# Patient Record
Sex: Male | Born: 1955
Health system: Southern US, Community
[De-identification: ages and names within clinical notes are randomized; demographics above are authoritative.]

## PROBLEM LIST (undated history)

## (undated) DIAGNOSIS — K56609 Unspecified intestinal obstruction, unspecified as to partial versus complete obstruction: Secondary | ICD-10-CM

## (undated) DIAGNOSIS — K5090 Crohn's disease, unspecified, without complications: Secondary | ICD-10-CM

## (undated) DIAGNOSIS — K5790 Diverticulosis of intestine, part unspecified, without perforation or abscess without bleeding: Secondary | ICD-10-CM

## (undated) DIAGNOSIS — K645 Perianal venous thrombosis: Secondary | ICD-10-CM

## (undated) DIAGNOSIS — T7840XA Allergy, unspecified, initial encounter: Secondary | ICD-10-CM

## (undated) DIAGNOSIS — E785 Hyperlipidemia, unspecified: Secondary | ICD-10-CM

## (undated) DIAGNOSIS — E669 Obesity, unspecified: Secondary | ICD-10-CM

## (undated) HISTORY — DX: Perianal venous thrombosis: K64.5

## (undated) HISTORY — PX: TONSILLECTOMY: SUR1361

## (undated) HISTORY — DX: Diverticulosis of intestine, part unspecified, without perforation or abscess without bleeding: K57.90

## (undated) HISTORY — PX: APPENDECTOMY: SHX54

## (undated) HISTORY — DX: Crohn's disease, unspecified, without complications: K50.90

## (undated) HISTORY — DX: Hyperlipidemia, unspecified: E78.5

## (undated) HISTORY — DX: Unspecified intestinal obstruction, unspecified as to partial versus complete obstruction: K56.609

## (undated) HISTORY — PX: HEMORROIDECTOMY: SUR656

## (undated) HISTORY — DX: Allergy, unspecified, initial encounter: T78.40XA

## (undated) HISTORY — DX: Obesity, unspecified: E66.9

## (undated) HISTORY — PX: LAMINECTOMY: SHX219

## (undated) HISTORY — PX: COLONOSCOPY: SHX174

---

## 1984-01-12 ENCOUNTER — Encounter: Payer: Self-pay | Admitting: Family Medicine

## 1984-01-12 LAB — CONVERTED CEMR LAB
RBC count: 4.57 10*6/uL
WBC, blood: 5.8 10*3/uL

## 1994-12-23 ENCOUNTER — Encounter: Payer: Self-pay | Admitting: Family Medicine

## 1994-12-23 LAB — CONVERTED CEMR LAB: Blood Glucose, Fasting: 88 mg/dL

## 2000-03-26 ENCOUNTER — Encounter: Payer: Self-pay | Admitting: Family Medicine

## 2000-03-26 LAB — CONVERTED CEMR LAB
Blood Glucose, Fasting: 97 mg/dL
TSH: 1.97 microintl units/mL

## 2002-03-07 ENCOUNTER — Encounter: Payer: Self-pay | Admitting: Family Medicine

## 2002-03-07 LAB — CONVERTED CEMR LAB: Blood Glucose, Fasting: 95 mg/dL

## 2003-01-23 ENCOUNTER — Encounter: Payer: Self-pay | Admitting: Family Medicine

## 2003-01-23 LAB — CONVERTED CEMR LAB
Blood Glucose, Fasting: 98 mg/dL
TSH: 1.34 microintl units/mL

## 2004-06-24 ENCOUNTER — Ambulatory Visit: Payer: Self-pay | Admitting: Gastroenterology

## 2004-08-18 ENCOUNTER — Ambulatory Visit: Payer: Self-pay | Admitting: Gastroenterology

## 2004-08-25 ENCOUNTER — Ambulatory Visit: Payer: Self-pay | Admitting: Gastroenterology

## 2004-09-05 ENCOUNTER — Ambulatory Visit: Payer: Self-pay | Admitting: Gastroenterology

## 2004-09-06 ENCOUNTER — Ambulatory Visit: Payer: Self-pay | Admitting: Gastroenterology

## 2004-09-23 ENCOUNTER — Ambulatory Visit: Payer: Self-pay | Admitting: Gastroenterology

## 2004-10-04 ENCOUNTER — Ambulatory Visit: Payer: Self-pay | Admitting: Gastroenterology

## 2004-10-07 ENCOUNTER — Ambulatory Visit (HOSPITAL_COMMUNITY): Admission: RE | Admit: 2004-10-07 | Discharge: 2004-10-07 | Payer: Self-pay | Admitting: Gastroenterology

## 2004-12-27 ENCOUNTER — Ambulatory Visit: Payer: Self-pay | Admitting: Gastroenterology

## 2005-03-23 ENCOUNTER — Ambulatory Visit: Payer: Self-pay | Admitting: Gastroenterology

## 2005-07-05 ENCOUNTER — Ambulatory Visit: Payer: Self-pay | Admitting: Family Medicine

## 2005-07-05 LAB — CONVERTED CEMR LAB
PSA: 0.14 ng/mL
RBC count: 4.95 10*6/uL
WBC, blood: 5 10*3/uL

## 2005-07-07 ENCOUNTER — Ambulatory Visit: Payer: Self-pay | Admitting: Family Medicine

## 2005-08-18 ENCOUNTER — Ambulatory Visit: Payer: Self-pay | Admitting: Family Medicine

## 2005-11-13 ENCOUNTER — Ambulatory Visit: Payer: Self-pay | Admitting: Family Medicine

## 2005-11-14 ENCOUNTER — Ambulatory Visit: Payer: Self-pay | Admitting: Family Medicine

## 2006-07-10 ENCOUNTER — Ambulatory Visit: Payer: Self-pay | Admitting: Family Medicine

## 2006-07-10 LAB — CONVERTED CEMR LAB
ALT: 19 units/L (ref 0–40)
Albumin: 3.6 g/dL (ref 3.5–5.2)
BUN: 13 mg/dL (ref 6–23)
Basophils Absolute: 0 10*3/uL (ref 0.0–0.1)
Basophils Relative: 0.2 % (ref 0.0–1.0)
CO2: 30 meq/L (ref 19–32)
Cholesterol: 172 mg/dL (ref 0–200)
Creatinine, Ser: 0.9 mg/dL (ref 0.4–1.5)
Eosinophils Absolute: 0.1 10*3/uL (ref 0.0–0.6)
Eosinophils Relative: 1.6 % (ref 0.0–5.0)
HCT: 43.9 % (ref 39.0–52.0)
HDL: 39.5 mg/dL (ref >39.0)
LDL Cholesterol: 110 mg/dL — ABNORMAL HIGH (ref 0–99)
MCHC: 35.1 g/dL (ref 30.0–36.0)
Neutro Abs: 4.1 10*3/uL (ref 1.4–7.7)
PSA: 0.13 ng/mL (ref 0.10–4.00)
RBC count: 4.86 10*6/uL
TSH: 1.73 microintl units/mL (ref 0.35–5.50)
Total CHOL/HDL Ratio: 4.4
Total Protein: 6.5 g/dL (ref 6.0–8.3)
WBC, blood: 5.7 10*3/uL

## 2006-07-12 ENCOUNTER — Ambulatory Visit: Payer: Self-pay | Admitting: Family Medicine

## 2007-08-01 ENCOUNTER — Encounter: Payer: Self-pay | Admitting: Family Medicine

## 2007-08-01 DIAGNOSIS — K5090 Crohn's disease, unspecified, without complications: Secondary | ICD-10-CM

## 2007-08-01 DIAGNOSIS — B009 Herpesviral infection, unspecified: Secondary | ICD-10-CM | POA: Insufficient documentation

## 2007-08-01 DIAGNOSIS — E7800 Pure hypercholesterolemia, unspecified: Secondary | ICD-10-CM | POA: Insufficient documentation

## 2007-08-01 HISTORY — DX: Crohn's disease, unspecified, without complications: K50.90

## 2007-08-22 ENCOUNTER — Ambulatory Visit: Payer: Self-pay | Admitting: Gastroenterology

## 2007-08-22 LAB — CONVERTED CEMR LAB
ALT: 23 units/L (ref 0–53)
AST: 21 units/L (ref 0–37)
Bilirubin, Direct: 0.1 mg/dL (ref 0.0–0.3)
Creatinine, Ser: 1 mg/dL (ref 0.4–1.5)
Glucose, Bld: 99 mg/dL (ref 70–99)
Sed Rate: 9 mm/hr (ref 0–20)
TSH: 2.09 microintl units/mL (ref 0.35–5.50)
Total Bilirubin: 1 mg/dL (ref 0.3–1.2)
Total Protein: 7 g/dL (ref 6.0–8.3)

## 2007-09-18 ENCOUNTER — Telehealth (INDEPENDENT_AMBULATORY_CARE_PROVIDER_SITE_OTHER): Payer: Self-pay | Admitting: *Deleted

## 2007-09-18 ENCOUNTER — Ambulatory Visit: Payer: Self-pay | Admitting: Family Medicine

## 2007-09-19 LAB — CONVERTED CEMR LAB
ALT: 23 units/L (ref 0–53)
Albumin: 3.6 g/dL (ref 3.5–5.2)
BUN: 15 mg/dL (ref 6–23)
Basophils Relative: 0.1 % (ref 0.0–1.0)
Chloride: 104 meq/L (ref 96–112)
Creatinine, Ser: 0.9 mg/dL (ref 0.4–1.5)
Direct LDL: 153.1 mg/dL
Glucose, Bld: 81 mg/dL (ref 70–99)
HCT: 44 % (ref 39.0–52.0)
Hemoglobin: 15.1 g/dL (ref 13.0–17.0)
Lymphocytes Relative: 21.7 % (ref 12.0–46.0)
MCHC: 34.2 g/dL (ref 30.0–36.0)
MCV: 91.1 fL (ref 78.0–100.0)
Neutrophils Relative %: 66.6 % (ref 43.0–77.0)
PSA: 0.16 ng/mL (ref 0.10–4.00)
Platelets: 203 10*3/uL (ref 150–400)
Potassium: 4.5 meq/L (ref 3.5–5.1)
Sodium: 142 meq/L (ref 135–145)
TSH: 1.78 microintl units/mL (ref 0.35–5.50)
Total CHOL/HDL Ratio: 5.3
Total Protein: 6.4 g/dL (ref 6.0–8.3)
VLDL: 39 mg/dL (ref 0–40)

## 2007-09-20 ENCOUNTER — Ambulatory Visit: Payer: Self-pay | Admitting: Family Medicine

## 2007-12-19 ENCOUNTER — Ambulatory Visit: Payer: Self-pay | Admitting: Family Medicine

## 2007-12-19 LAB — CONVERTED CEMR LAB
Cholesterol: 169 mg/dL (ref 0–200)
LDL Cholesterol: 103 mg/dL — ABNORMAL HIGH (ref 0–99)
Total CHOL/HDL Ratio: 4.8
Triglycerides: 156 mg/dL — ABNORMAL HIGH (ref 0–149)

## 2007-12-23 ENCOUNTER — Ambulatory Visit: Payer: Self-pay | Admitting: Family Medicine

## 2008-12-04 ENCOUNTER — Ambulatory Visit: Payer: Self-pay | Admitting: Family Medicine

## 2008-12-05 LAB — CONVERTED CEMR LAB
ALT: 28 units/L (ref 0–53)
AST: 22 units/L (ref 0–37)
Alkaline Phosphatase: 53 units/L (ref 39–117)
Basophils Absolute: 0 10*3/uL (ref 0.0–0.1)
Basophils Relative: 0 % (ref 0.0–3.0)
CO2: 30 meq/L (ref 19–32)
Calcium: 9 mg/dL (ref 8.4–10.5)
Eosinophils Absolute: 0.1 10*3/uL (ref 0.0–0.7)
Eosinophils Relative: 1.4 % (ref 0.0–5.0)
Hemoglobin: 15.3 g/dL (ref 13.0–17.0)
LDL Cholesterol: 110 mg/dL — ABNORMAL HIGH (ref 0–99)
MCV: 90.8 fL (ref 78.0–100.0)
Neutro Abs: 3.3 10*3/uL (ref 1.4–7.7)
PSA: 0.26 ng/mL (ref 0.10–4.00)
Platelets: 220 10*3/uL (ref 150.0–400.0)
Potassium: 4.3 meq/L (ref 3.5–5.1)
RDW: 12.1 % (ref 11.5–14.6)
Sodium: 143 meq/L (ref 135–145)
TSH: 2.04 microintl units/mL (ref 0.35–5.50)
Total Protein: 6.5 g/dL (ref 6.0–8.3)
Triglycerides: 185 mg/dL — ABNORMAL HIGH (ref 0.0–149.0)
VLDL: 37 mg/dL (ref 0.0–40.0)

## 2008-12-16 ENCOUNTER — Ambulatory Visit: Payer: Self-pay | Admitting: Family Medicine

## 2009-01-04 ENCOUNTER — Ambulatory Visit: Payer: Self-pay | Admitting: Gastroenterology

## 2009-02-15 ENCOUNTER — Ambulatory Visit: Payer: Self-pay | Admitting: Gastroenterology

## 2009-02-15 ENCOUNTER — Encounter: Payer: Self-pay | Admitting: Gastroenterology

## 2009-02-16 ENCOUNTER — Encounter: Payer: Self-pay | Admitting: Gastroenterology

## 2010-01-04 ENCOUNTER — Encounter (INDEPENDENT_AMBULATORY_CARE_PROVIDER_SITE_OTHER): Payer: Self-pay | Admitting: *Deleted

## 2010-02-01 ENCOUNTER — Telehealth: Payer: Self-pay | Admitting: Family Medicine

## 2010-02-23 ENCOUNTER — Encounter: Payer: Self-pay | Admitting: Gastroenterology

## 2010-02-23 ENCOUNTER — Ambulatory Visit: Payer: Self-pay | Admitting: Gastroenterology

## 2010-02-23 ENCOUNTER — Emergency Department (HOSPITAL_COMMUNITY): Admission: EM | Admit: 2010-02-23 | Discharge: 2010-02-23 | Payer: Self-pay | Admitting: Emergency Medicine

## 2010-02-23 ENCOUNTER — Telehealth: Payer: Self-pay | Admitting: Gastroenterology

## 2010-04-26 ENCOUNTER — Ambulatory Visit: Payer: Self-pay | Admitting: Family Medicine

## 2010-04-27 LAB — CONVERTED CEMR LAB
ALT: 24 units/L (ref 0–53)
AST: 18 units/L (ref 0–37)
Albumin: 3.7 g/dL (ref 3.5–5.2)
Alkaline Phosphatase: 58 units/L (ref 39–117)
BUN: 15 mg/dL (ref 6–23)
Basophils Absolute: 0 10*3/uL (ref 0.0–0.1)
Basophils Relative: 0.2 % (ref 0.0–3.0)
Bilirubin, Direct: 0.1 mg/dL (ref 0.0–0.3)
CO2: 28 meq/L (ref 19–32)
Calcium: 9.1 mg/dL (ref 8.4–10.5)
Chloride: 106 meq/L (ref 96–112)
Cholesterol: 191 mg/dL (ref 0–200)
Creatinine, Ser: 1.1 mg/dL (ref 0.4–1.5)
Direct LDL: 118.8 mg/dL
Eosinophils Absolute: 0.1 10*3/uL (ref 0.0–0.7)
Eosinophils Relative: 1.6 % (ref 0.0–5.0)
GFR calc non Af Amer: 77.18 mL/min (ref >60)
Glucose, Bld: 100 mg/dL — ABNORMAL HIGH (ref 70–99)
HCT: 43.8 % (ref 39.0–52.0)
HDL: 38.8 mg/dL — ABNORMAL LOW (ref >39.00)
Hemoglobin: 15.2 g/dL (ref 13.0–17.0)
Lymphocytes Relative: 21 % (ref 12.0–46.0)
Lymphs Abs: 1.2 10*3/uL (ref 0.7–4.0)
MCHC: 34.8 g/dL (ref 30.0–36.0)
MCV: 91.1 fL (ref 78.0–100.0)
Monocytes Absolute: 0.4 10*3/uL (ref 0.1–1.0)
Monocytes Relative: 7.5 % (ref 3.0–12.0)
Neutro Abs: 3.9 10*3/uL (ref 1.4–7.7)
Neutrophils Relative %: 69.7 % (ref 43.0–77.0)
PSA: 0.16 ng/mL (ref 0.10–4.00)
Platelets: 219 10*3/uL (ref 150.0–400.0)
Potassium: 4.3 meq/L (ref 3.5–5.1)
RBC: 4.8 M/uL (ref 4.22–5.81)
RDW: 13.3 % (ref 11.5–14.6)
Sodium: 141 meq/L (ref 135–145)
TSH: 1.87 microintl units/mL (ref 0.35–5.50)
Total Bilirubin: 0.7 mg/dL (ref 0.3–1.2)
Total CHOL/HDL Ratio: 5
Total Protein: 6.2 g/dL (ref 6.0–8.3)
Triglycerides: 212 mg/dL — ABNORMAL HIGH (ref 0.0–149.0)
VLDL: 42.4 mg/dL — ABNORMAL HIGH (ref 0.0–40.0)
WBC: 5.5 10*3/uL (ref 4.5–10.5)

## 2010-04-28 ENCOUNTER — Ambulatory Visit: Payer: Self-pay | Admitting: Family Medicine

## 2010-06-30 NOTE — Progress Notes (Signed)
Summary: Triage  Phone Note Call from Patient   Caller: spouse  Jackelyn Poling  [phone] Call For: Dr. Sharlett Iles Reason for Call: Talk to Nurse Summary of Call: pt. is having sharp pain in his abd...severe flareup with his crohn's disease Initial call taken by: Webb Laws,  February 23, 2010 8:07 AM  Follow-up for Phone Call        Patient c/o severe abdominal pain that started sudenly last night.  he c/o large distended abdomen.  Denies vomiting or nausea, but states he has not tried to eat anything.  Patient  has a hx of a partial SB obstruction in the past. I have reviewed with Nicoletta Ba PA and patient is advised to go the ER for eval.  Tye Savoy RNP has been notified the patient is coming to [facility].   Follow-up by: Barb Merino RN, [place],  February 23, 2010 10:09 AM

## 2010-06-30 NOTE — Progress Notes (Signed)
Summary: Rx Simvastatin  Phone Note Call from Patient Call back at [phone]   Caller: Patient Call For: Dr. Damita Dunnings Summary of Call: Patient needs a refill on his Simvastatin. Patient has a 30 Minute checkup scheduled with Dr. Council Mechanic on 03/31/10. Patient needs enough to last him until  his upcoming appt. [facility]  Follow-up for Phone Call       Follow-up by: Elsie Stain MD,  February 01, 2010 1:39 PM    Prescriptions: ZOCOR 80 MG TABS (SIMVASTATIN) 1 TABLET BYMOUTH AT BEDTIME  #30 Tablet x 5   Entered and Authorized by:   Elsie Stain MD   Signed by:   Elsie Stain MD on 02/01/2010   Method used:   Electronically to        [facility] (retail)       [address]       [place], [place]  [address]       Ph: [phone]       Fax: [phone]   [place]:   [phone]

## 2010-06-30 NOTE — Letter (Signed)
Summary: Anabel Halon letter  [place] [facility]  [address] [place], [place] [address]   Phone: [phone]  Fax: [phone]       01/04/2010 MRN: [id number]  [place], [place]  [address]  Dear Mr. Shirlyn Goltz [place], and Lluveras announce the retirement of Modesto Charon, M.D., from full-time practice at the [facility] office effective November 25, 2009 and his plans of returning part-time.  It is important to Dr. Council Mechanic and to our practice that you understand that [facility] has seven physicians in our office for your health care needs.  We will continue to offer the same exceptional care that you have today.    Dr. Council Mechanic has spoken to many of you about his plans for retirement and returning part-time in the fall.   We will continue to work with you through the transition to schedule appointments for you in the office and meet the high standards that [facility] is committed to.   Again, it is with great pleasure that we share the news that Dr. Council Mechanic will return to [facility] in October of 2011 with a reduced schedule.    If you have any questions, or would like to request an appointment with one of our physicians, please call us at [phone] and press the option for Scheduling an appointment.  We take pleasure in providing you with excellent patient care and look forward to seeing you at your next office visit.  [facility] are:  Viviana Simpler, M.D. Teresa Pelton, M.D. Loura Pardon, M.D. Eliezer Lofts, M.D. Owens Loffler, M.D. Arnette Norris, M.D. We proudly welcomed Renford Dills, M.D. and Ria Bush, M.D. to the practice in July/August 2011.  Sincerely,  [facility]

## 2010-06-30 NOTE — Letter (Signed)
Summary: New Patient letter  [facility]  [address] [place], [place] [address]   Phone: [phone]  Fax: [phone]       02/23/2010 MRN: [id number]  [place], [place]  [address]  Dear Mr. Midgley,  Welcome to the Gastroenterology Division at [facility].    You are scheduled to see Dr.  Sharlett Iles on 03-29-10 at 11:30am on the 3rd floor at [facility], [place] [employer].  We ask that you try to arrive at our office 15 minutes prior to your appointment time to allow for check-in.  We would like you to complete the enclosed self-administered evaluation form prior to your visit and bring it with you on the day of your appointment.  We will review it with you.  Also, please bring a complete list of all your medications or, if you prefer, bring the medication bottles and we will list them.  Please bring your insurance card so that we may make a copy of it.  If your insurance requires a referral to see a specialist, please bring your referral form from your primary care physician.  Co-payments are due at the time of your visit and may be paid by cash, check or credit card.     Your office visit will consist of a consult with your physician (includes a physical exam), any laboratory testing he/she may order, scheduling of any necessary diagnostic testing (e.g. x-ray, ultrasound, CT-scan), and scheduling of a procedure (e.g. Endoscopy, Colonoscopy) if required.  Please allow enough time on your schedule to allow for any/all of these possibilities.    If you cannot keep your appointment, please call [phone] to cancel or reschedule prior to your appointment date.  This allows [place] the opportunity to schedule an appointment for another patient in need of care.  If you do not cancel or reschedule by 5 p.m. the business day prior to your appointment date, you will be charged a $50.00 late cancellation/no-show fee.    Thank you for  choosing [facility] for your medical needs.  We appreciate the opportunity to care for you.  Please visit [place] at our website  to learn more about our practice.                     Sincerely,                                                             The Gastroenterology Division

## 2010-06-30 NOTE — Assessment & Plan Note (Signed)
Summary: CPX/JRR   Vital Signs:  Patient profile:   55 year old male Weight:      185.75 pounds BMI:     28.35 Temp:     97.8 degrees F oral Pulse rate:   76 / minute Pulse rhythm:   regular BP sitting:   110 / 70  (left arm) Cuff size:   large  Vitals Entered By: Emelia Salisbury LPN (April 29, 3535 2:58 PM) CC: 30 Minute checkup, had a colonoscopy 9/10   History of Present Illness: Pt here for Comp Exam. He has a place on the lower left lateral abdomen that hurts. 4/10, just there. Eating does not bother it, BMs nml, urination nml, colonoscopy last year, no mention to him of divertics.  Preventive Screening-Counseling & Management  Alcohol-Tobacco     Alcohol drinks/day: <1     Alcohol type: beer wine     Smoking Status: never     Passive Smoke Exposure: no  Caffeine-Diet-Exercise     Caffeine use/day: 2     Does Patient Exercise: no     Type of exercise: cardio and weights     Times/week: 3  Problems Prior to Update: 1)  Health Maintenance Exam  (ICD-V70.0) 2)  Special Screening Malignant Neoplasm of Prostate  (ICD-V76.44) 3)  Crohn's Disease  (ICD-555.9) 4)  Herpes Simplex Infection  (ICD-054.9) 5)  Hypercholesterolemia  (ICD-272.0)  Medications Prior to Update: 1)  Zocor 80 Mg Tabs (Simvastatin) .Marland Kitchen.. 1 Tablet [place] At Bedtime 2)  Levbid 0.375 Mg Tb12 (Hyoscyamine Sulfate) .... Take 1 Tablet By Mouth Every Twelve Hours  Current Medications (verified): 1)  Zocor 80 Mg Tabs (Simvastatin) .Marland Kitchen.. 1 Tablet [place] At Bedtime 2)  Levbid 0.375 Mg Tb12 (Hyoscyamine Sulfate) .... Take 1 Tablet By Mouth Every Twelve Hours  Allergies: No Known Drug Allergies  Past History:  Family History: Last updated: 04/28/2010 Father:A 12  CAD S/P MI Mother: A 63  +HTN SISTER A 58  (KELLY) obese  CV: +PGF DECEASED FROM MI// +MGF DECEASED FROM MI HBP: +PGF, +MGF., + MOTHER DIABETES : POSITIVE GOUT/ARTHRITIS:  PROSTATE CANCER: BREAST/OVARIAN/UTERINE CANCER: + MOTHER UTERINE  CANCER COLON CANCER: DEPRESSION: ETOH/DRUG ABUSE: NEGATIVE OTHER :NEGATIVE STROKE  Social History: Last updated: 12/16/2008 Marital Status: Married LIVES WITH WIFE  Children: 1 CHILD Occupation: PHARMACEUTICAL  REP  Risk Factors: Alcohol Use: <1 (04/28/2010) Caffeine Use: 2 (04/28/2010) Exercise: no (04/28/2010)  Risk Factors: Smoking Status: never (04/28/2010) Passive Smoke Exposure: no (04/28/2010)  Past Surgical History: Appendectomy 1988 Laminectomy [facility] x2  Crohn's Flares Colonoscopy --normal:(Dr. Patterson)-(-10/10/1996) Colonoscopy --polyps b-9 (+CROHNS DISEASE):(12/14/2003) Vasectomy  (Dr. McDermiad) :(06/2005) Colonoscopy Divertics, Int [place] (Dr Sharlett Iles) (02/15/2009)             5 yrs  Family History: Father:A 39  CAD S/P MI Mother: A 25  +HTN SISTER A 40  (KELLY) obese  CV: +PGF DECEASED FROM MI// +MGF DECEASED FROM MI HBP: +PGF, +MGF., + MOTHER DIABETES : POSITIVE GOUT/ARTHRITIS:  PROSTATE CANCER: BREAST/OVARIAN/UTERINE CANCER: + MOTHER UTERINE CANCER COLON CANCER: DEPRESSION: ETOH/DRUG ABUSE: NEGATIVE OTHER :NEGATIVE STROKE  Social History: Caffeine use/day:  2 Does Patient Exercise:  no  Review of Systems General:  Denies chills, fatigue, fever, sweats, weakness, and weight loss. Eyes:  Denies blurring, discharge, double vision, and eye pain. ENT:  Denies decreased hearing, ear discharge, earache, and ringing in ears. CV:  Denies chest pain or discomfort, fainting, fatigue, palpitations, shortness of breath with exertion, swelling of feet, and swelling of  hands. Resp:  Denies cough, shortness of breath, and wheezing. GI:  Complains of abdominal pain; denies bloody stools, change in bowel habits, constipation, dark tarry stools, diarrhea, indigestion, loss of appetite, nausea, vomiting, vomiting blood, and yellowish skin color; see HPI. GU:  Complains of nocturia; denies discharge, dysuria, and urinary frequency; once. MS:  Denies joint  pain, low back pain, muscle aches, cramps, and stiffness. Derm:  Complains of dryness; denies itching and rash. Neuro:  Denies numbness, poor balance, tingling, and tremors.  Physical Exam  General:  Well-developed,well-nourished,in no acute distress; alert,appropriate and cooperative throughout examination Head:  Normocephalic and atraumatic without obvious abnormalities. No apparent alopecia but min male pattern  balding. Eyes:  Conjunctiva clear bilaterally.  Ears:  External ear exam shows no significant lesions or deformities.  Otoscopic examination reveals clear canals, tympanic membranes are intact bilaterally without bulging, retraction, inflammation or discharge. Hearing is grossly normal bilaterally. Nose:  External nasal examination shows no deformity or inflammation. Nasal mucosa are pink and moist without lesions or exudates. Mouth:  Oral mucosa and oropharynx without lesions or exudates.  Teeth in good repair. Neck:  No deformities, masses, or tenderness noted. Chest Wall:  No deformities, masses, tenderness or gynecomastia noted. Breasts:  No masses or gynecomastia noted Lungs:  Normal respiratory effort, chest expands symmetrically. Lungs are clear to auscultation, no crackles or wheezes. Heart:  Normal rate and regular rhythm. S1 and S2 normal without gallop, murmur, click, rub or other extra sounds. Abdomen:  Bowel sounds positive,abdomen soft  without masses, organomegaly or hernias noted. Focally tender in the lateral left lower quadrant in a spot approx 1cm diam, musc tighten when pt found, BS active and nml sounding over the point. Rectal:  No external abnormalities noted. Normal sphincter tone. No rectal masses or tenderness. Gneg. Genitalia:  Testes bilaterally descended without nodularity, tenderness or masses. No scrotal masses or lesions. No penis lesions or urethral discharge. Prostate:  Prostate gland firm and smooth, no enlargement, nodularity, tenderness, mass,  asymmetry or induration. 20gms. Msk:  No deformity or scoliosis noted of thoracic or lumbar spine.   Pulses:  R and L carotid,radial,femoral,dorsalis pedis and posterior tibial pulses are full and equal bilaterally Extremities:  No clubbing, cyanosis, edema, or deformity noted with normal full range of motion of all joints.   Neurologic:  No cranial nerve deficits noted. Station and gait are normal. Sensory, motor and coordinative functions appear intact. Skin:  Intact without suspicious lesions or rashes Cervical Nodes:  No lymphadenopathy noted Inguinal Nodes:  No significant adenopathy Psych:  Cognition and judgment appear intact. Alert and cooperative with normal attention span and concentration. No apparent delusions, illusions, hallucinations   Impression & Recommendations:  Problem # 1:  [place] (ICD-V70.0) Assessment Comment Only  Reviewed preventive care protocols, scheduled due services, and updated immunizations.  Problem # 2:  ABDOMINAL PAIN, LLQ, FOCAL (ICD-789.04) Assessment: New  Presumed acute diverticulitis. will prophylax with Cipro 500 two times a day for one week. RTC if sxs don't totally resolve.  Discussed symptom control with the patient.   Problem # 3:  SPECIAL SCREENING MALIGNANT NEOPLASM OF PROSTATE (ICD-V76.44) Assessment: Unchanged Stable exam and PSA.  Problem # 4:  HYPERCHOLESTEROLEMIA (ICD-272.0) Assessment: Unchanged Adequate but changing from Zocor 80 (high dose but has been on it many years so risk of rabdo minimal) to Crestor 20 would be reasonable to improve Trigs and HDL. He will check his insurance company. His updated medication list for this problem includes:  Zocor 80 Mg Tabs (Simvastatin) .Marland Kitchen... 1 tablet [place] at bedtime  Labs Reviewed: SGOT: 18 (04/26/2010)   SGPT: 24 (04/26/2010)   HDL:38.80 (04/26/2010), 39.50 (12/04/2008)  LDL:110 (12/04/2008), 103 (12/19/2007)  Chol:191 (04/26/2010), 186 (12/04/2008)  Trig:212.0  (04/26/2010), 185.0 (12/04/2008)  Problem # 5:  CROHN'S DISEASE (ICD-555.9) Assessment: Unchanged Seems stable, last colonoscopy last year.  Complete Medication List: 1)  Zocor 80 Mg Tabs (Simvastatin) .Marland Kitchen.. 1 tablet [place] at bedtime 2)  Levbid 0.375 Mg Tb12 (Hyoscyamine sulfate) .... Take 1 tablet by mouth every twelve hours 3)  Cipro 500 Mg Tabs (Ciprofloxacin hcl) .... One tab by mouth two times a day  Patient Instructions: 1)  RTC if abd pain doesn't resolve in one week. 2)  Call if insur will cover Crestor to switch. Prescriptions: CIPRO 500 MG TABS (CIPROFLOXACIN HCL) one tab by mouth two times a day  #14 x 0   Entered and Authorized by:   Raenette Rover MD   Signed by:   Raenette Rover MD on 04/28/2010   Method used:   Electronically to        [facility]       [address]       [place], [place]  [address]       Ph: [phone]       Fax: [phone]   RxID:   [id number]    Orders Added: 1)  Est. Patient 40-64 years [[id number]]    Current Allergies (reviewed today): No known allergies

## 2010-08-11 LAB — CBC
MCH: 31.2 pg (ref 26.0–34.0)
MCV: 90.2 fL (ref 78.0–100.0)
Platelets: 221 10*3/uL (ref 150–400)
RBC: 5.09 MIL/uL (ref 4.22–5.81)

## 2010-08-11 LAB — DIFFERENTIAL
Basophils Absolute: 0 10*3/uL (ref 0.0–0.1)
Eosinophils Relative: 0 % (ref 0–5)
Lymphocytes Relative: 4 % — ABNORMAL LOW (ref 12–46)
Lymphs Abs: 0.3 10*3/uL — ABNORMAL LOW (ref 0.7–4.0)
Monocytes Absolute: 0.3 10*3/uL (ref 0.1–1.0)
Monocytes Relative: 4 % (ref 3–12)
Neutrophils Relative %: 93 % — ABNORMAL HIGH (ref 43–77)

## 2010-08-11 LAB — COMPREHENSIVE METABOLIC PANEL
Alkaline Phosphatase: 57 U/L (ref 39–117)
Chloride: 107 mEq/L (ref 96–112)
GFR calc non Af Amer: >60 mL/min (ref >60)
Potassium: 3.9 mEq/L (ref 3.5–5.1)
Total Bilirubin: 0.9 mg/dL (ref 0.3–1.2)

## 2010-08-11 LAB — POCT I-STAT, CHEM 8
Calcium, Ion: 1.14 mmol/L (ref 1.12–1.32)
Potassium: 3.9 mEq/L (ref 3.5–5.1)

## 2010-08-11 LAB — URINALYSIS, ROUTINE W REFLEX MICROSCOPIC
Glucose, UA: NEGATIVE mg/dL
Ketones, ur: 15 mg/dL — AB
Specific Gravity, Urine: 1.03 (ref 1.005–1.030)
Urobilinogen, UA: 0.2 mg/dL (ref 0.0–1.0)
pH: 5.5 (ref 5.0–8.0)

## 2010-08-11 LAB — LIPASE, BLOOD: Lipase: 20 U/L (ref 11–59)

## 2010-08-11 LAB — URINE MICROSCOPIC-ADD ON

## 2010-10-11 NOTE — Assessment & Plan Note (Signed)
[facility] OFFICE NOTE   NAME:Babula, CHASEN MENDELL                      MRN:          [id number]  DATE:08/22/2007                            DOB:          Jul 18, 1955    I have not seen Harim since October of 2006.  He has had some periodic  flares of his Crohn's ileocolitis, usually managed by a low-fiber diet  and p.r.n. anticholinergics.  The last 24 hours had rather severe crampy  abdominal pain, some soft stools, but not rectal bleeding, nausea,  vomiting, or systemic complaints.  He denies recent antibiotic use,  infectious disease exposure, or sick family members at home.   Review of his chart shows previous allergic reaction to 6MP with good  response to the past 2 brief trials of corticosteroids.  He has a known  stricture in his distal ileum per small bowel series last obtained in  May of 2006.  Last colonoscopy exam was in July of 2005 and he had  rather active ileocolitis at that time.   He appears slightly ill but is not toxic.  He weighs 181 pounds, and blood pressure 110/76, and the pulse was 68  and regular.  I could not appreciate icterus or stigmata of chronic liver disease.  His chest was entirely clear.  He was in a regular rhythm without murmurs, gallops, or rubs.  His abdomen showed no distension, organomegaly or masses, or significant  tenderness.  Bowel sounds were very active and some were high pitched.  Peripheral extremities were unremarkable.  Mental status was normal.  Inspection of the rectum was unremarkable as was rectal exam with soft  stool in the rectal vault that was guaiac negative.   ASSESSMENT:  I think Mr. Sear has a probable flare of his Crohn's  ileocolitis, and his symptoms seem most consistent with partial small  bowel obstruction.  He has had this disease, now, for 29 years and may  need surgical intervention in the near future.   RECOMMENDATIONS:  1.  Prednisone 30 mg a day for 2 weeks with low-fiber diet.  2. Check screening laboratory parameters.  3. Gastrointestinal followup in 2 weeks' time or sooner should he not      respond.  Will probably check CT scan to evaluate the extent of his      disease at that time depending on his clinical course.     Loralee Pacas. Sharlett Iles, MD, [place], [place]  Electronically Signed    DRP/MedQ  DD: 08/22/2007  DT: 08/22/2007  Job #: [id number]

## 2010-11-29 ENCOUNTER — Other Ambulatory Visit: Payer: Self-pay | Admitting: *Deleted

## 2010-11-29 MED ORDER — SIMVASTATIN 80 MG PO TABS: 80.0000 mg | ORAL_TABLET | Freq: Every day | ORAL | Status: DC

## 2011-10-09 ENCOUNTER — Encounter: Payer: Self-pay | Admitting: Family Medicine

## 2012-12-04 ENCOUNTER — Encounter (INDEPENDENT_AMBULATORY_CARE_PROVIDER_SITE_OTHER): Payer: Self-pay | Admitting: Surgery

## 2012-12-04 ENCOUNTER — Ambulatory Visit (INDEPENDENT_AMBULATORY_CARE_PROVIDER_SITE_OTHER): Payer: Federal, State, Local not specified - PPO | Admitting: Surgery

## 2012-12-04 VITALS — BP 102/62 | HR 70 | Resp 18 | Ht 68.0 in | Wt 184.0 lb

## 2012-12-04 DIAGNOSIS — K5790 Diverticulosis of intestine, part unspecified, without perforation or abscess without bleeding: Secondary | ICD-10-CM

## 2012-12-04 DIAGNOSIS — K645 Perianal venous thrombosis: Secondary | ICD-10-CM

## 2012-12-04 DIAGNOSIS — K5730 Diverticulosis of large intestine without perforation or abscess without bleeding: Secondary | ICD-10-CM

## 2012-12-04 HISTORY — DX: Perianal venous thrombosis: K64.5

## 2012-12-04 NOTE — Progress Notes (Signed)
Subjective:     Patient ID: Terry Kerr, male   DOB: December 23, 1955, 57 y.o.   MRN: [id number]  HPI  Nicolaus Andel  January 08, 1956 [id number]  Patient Care Team: Janalyn Rouse, MD as PCP - General (Internal Medicine) Sable Feil, MD as Attending Physician (Gastroenterology)  This patient is a 57 y.o.male who presents today for surgical evaluation at the request of Dr. Brigitte Pulse.   Reason for visit: Painful hemorrhoid  Pleasant male with Crohn's disease.  Mild.  Not requiring immunosuppression.  History some mild chronic hemorrhoids.  No history of other anorectal problems such as abscess or fistula.  Had severe painful hemorrhoid month ago.  Has not improved.  Has tried warm soaks.  He saw his primary care physician yesterday.  Started on Proctosol cream.  Some mild help.  Pericortical.  Surgical consultation requested.  Patient usually has a couple bowel movements in the morning.  He had a colonoscopy in 2010 that did not show any active colitis or abnormalities.  He walks 30 minutes without difficulty.  No personal nor family history of GI/colon cancer, irritable bowel syndrome, allergy such as Celiac Sprue, dietary/dairy problems, colitis, ulcers nor gastritis.  No recent sick contacts/gastroenteritis.  No travel outside the country.  No changes in diet.    Patient Active Problem List   Diagnosis Date Noted  . HERPES SIMPLEX INFECTION 08/01/2007  . HYPERCHOLESTEROLEMIA 08/01/2007  . CROHN'S DISEASE 08/01/2007    Past Medical History  Diagnosis Date  . Allergic rhinitis     pollen, animal hair, dust mites  . HLD (hyperlipidemia)   . Crohn disease     Past Surgical History  Procedure Laterality Date  . Appendectomy    . Tonsillectomy    . Laminectomy      History   Social History  . Marital Status: Married    Spouse Name: N/A    Number of Children: N/A  . Years of Education: N/A   Occupational History  . Not on file.   Social History Main Topics  . Smoking status:  Never Smoker   . Smokeless tobacco: Not on file  . Alcohol Use: Yes  . Drug Use: No  . Sexually Active: Not on file   Other Topics Concern  . Not on file   Social History Narrative  . No narrative on file    History reviewed. No pertinent family history.  Current Outpatient Prescriptions  Medication Sig Dispense Refill  . simvastatin (ZOCOR) 80 MG tablet Take 1 tablet (80 mg total) by mouth at bedtime.  30 tablet  6   No current facility-administered medications for this visit.     Not on File  BP 102/62  Pulse 70  Resp 18  Ht 5' 8"  (1.727 m)  Wt 184 lb (83.462 kg)  BMI 27.98 kg/m2  No results found.   Review of Systems  Constitutional: Negative for fever, chills and diaphoresis.  HENT: Negative for nosebleeds, sore throat, facial swelling, mouth sores, trouble swallowing and ear discharge.   Eyes: Negative for photophobia, discharge and visual disturbance.  Respiratory: Negative for choking, chest tightness, shortness of breath and stridor.   Cardiovascular: Negative for chest pain and palpitations.  Gastrointestinal: Positive for anal bleeding and rectal pain. Negative for nausea, vomiting, abdominal pain, diarrhea, constipation, blood in stool and abdominal distention.  Endocrine: Negative for cold intolerance and heat intolerance.  Genitourinary: Negative for dysuria, urgency, difficulty urinating and testicular pain.  Musculoskeletal: Negative for myalgias, back pain, arthralgias and gait  problem.  Skin: Negative for color change, pallor, rash and wound.  Allergic/Immunologic: Negative for environmental allergies and food allergies.  Neurological: Negative for dizziness, speech difficulty, weakness, numbness and headaches.  Hematological: Negative for adenopathy. Does not bruise/bleed easily.  Psychiatric/Behavioral: Negative for hallucinations, confusion and agitation.       Objective:   Physical Exam  Constitutional: He is oriented to person, place, and  time. He appears well-developed and well-nourished. No distress.  HENT:  Head: Normocephalic.  Mouth/Throat: Oropharynx is clear and moist. No oropharyngeal exudate.  Eyes: Conjunctivae and EOM are normal. Pupils are equal, round, and reactive to light. No scleral icterus.  Neck: Normal range of motion. Neck supple. No tracheal deviation present.  Cardiovascular: Normal rate, regular rhythm and intact distal pulses.   Pulmonary/Chest: Effort normal and breath sounds normal. No respiratory distress.  Abdominal: Soft. He exhibits no distension. There is no tenderness. Hernia confirmed negative in the right inguinal area and confirmed negative in the left inguinal area.  Genitourinary:     Musculoskeletal: Normal range of motion. He exhibits no tenderness.  Lymphadenopathy:    He has no cervical adenopathy.       Right: No inguinal adenopathy present.       Left: No inguinal adenopathy present.  Neurological: He is alert and oriented to person, place, and time. No cranial nerve deficit. He exhibits normal muscle tone. Coordination normal.  Skin: Skin is warm and dry. No rash noted. He is not diaphoretic. No erythema. No pallor.  Psychiatric: He has a normal mood and affect. His behavior is normal. Judgment and thought content normal.       Assessment:     Thrombosed prolapsed inflamed hemorrhoids x3 piles.  Right anterior greater than left lateral greater than right posterior     Plan:     I offered incision and drainage of the thrombosed hemorrhoids in the office.  He agreed:  The anatomy & physiology of the anorectal region was discussed.  The pathophysiology of hemorrhoids and differential diagnosis was discussed.  Natural history progression  of worsening swelling with eventual resolution over weeks to months was discussed.   I stressed the importance of a bowel regimen to have daily soft bowel movements to minimize progression of disease.     The patient's symptoms are not  adequately controlled.  Therefore, I recommended incision to drain the thrombosed hemorrhoid..  I went over the technique, risks, benefits, and alternatives.   Questions were answered.  The patient expressed understanding & wished to proceed.  The patient was positioned in the lateral decubitus position.  I placed a field block of local anaesthetic.  Perianal & rectal examination was done.  I attempted incision and drainage of the thrombosed hemorrhoid.  However he was still too sensitive.  Had hard he reached the upper limits of placing local anesthetic.  I aborted the procedure.  Unfortunately these are too sensitive to treat in the office.  I therefore recommended examination under anesthesia and decompression in the operating room.  Possible partial hemorrhoidectomies as well.  I cautioned him I cannot be too aggressive in an inflamed setting given his history of Crohn's and all piles involved.  Stricturing and wound problems is higher risk in an inflamed setting.  Opening available in the next two days.  He felt comfortable waiting:  The anatomy & physiology of the anorectal region was discussed.  The pathophysiology of hemorrhoids and differential diagnosis was discussed.  Natural history risks without surgery was discussed.  I stressed the importance of a bowel regimen to have daily soft bowel movements to minimize progression of disease.  Interventions such as sclerotherapy & banding were discussed.  The patient's symptoms are not adequately controlled by medicines and other non-operative treatments.  I feel the risks & problems of no surgery outweigh the operative risks; therefore, I recommended surgery to treat the hemorrhoids by ligation, pexy, and possible resection.  Given the inflamed nature, I cautioned him against being overly aggressive in plan mainly decompression and ligation.  Risks such as bleeding, infection, need for further treatment, heart attack, death, and other risks were  discussed.   I noted a good likelihood this will help address the problem.  Goals of post-operative recovery were discussed as well.  Possibility that this will not correct all symptoms was explained.  Post-operative pain, bleeding, constipation, and other problems after surgery were discussed.  We will work to minimize complications.   Educational handouts further explaining the pathology, treatment options, and bowel regimen were given as well.  Questions were answered.  The patient expresses understanding & wishes to proceed with surgery.  Educational handouts further explaining the pathology, treatment options, and bowel regimen were given as well.

## 2012-12-04 NOTE — Patient Instructions (Addendum)
See the Handout(s) we gave you.  Consider surgery.  Please call our office at [phone] if you wish to schedule surgery or if you have further questions / concerns.    ANORECTAL SURGERY: POST OP INSTRUCTIONS  1. Take your usually prescribed home medications unless otherwise directed. 2. DIET: Follow a light bland diet the first 24 hours after arrival home, such as soup, liquids, crackers, etc.  Be sure to include lots of fluids daily.  Avoid fast food or heavy meals as your are more likely to get nauseated.  Eat a low fat the next few days after surgery.   3. PAIN CONTROL: a. Pain is best controlled by a usual combination of three different methods TOGETHER: i. Ice/Heat ii. Over the counter pain medication iii. Prescription pain medication b. Most patients will experience some swelling and discomfort in the anus/rectal area. and incisions.  Ice packs or heat (30-60 minutes up to 6 times a day) will help. Use ice for the first few days to help decrease swelling and bruising, then switch to heat such as warm towels, sitz baths, warm baths, etc to help relax tight/sore spots and speed recovery.  Some people prefer to use ice alone, heat alone, alternating between ice & heat.  Experiment to what works for you.  Swelling and bruising can take several weeks to resolve.   c. It is helpful to take an over-the-counter pain medication regularly for the first few weeks.  Choose one of the following that works best for you: i. Naproxen (Aleve, etc)  Two 296m tabs twice a day ii. Ibuprofen (Advil, etc) Three 2041mtabs four times a day (every meal & bedtime) iii. Acetaminophen (Tylenol, etc) 500-65066mour times a day (every meal & bedtime) d. A  prescription for pain medication (such as oxycodone, hydrocodone, etc) should be given to you upon discharge.  Take your pain medication as prescribed.  i. If you are having problems/concerns with the prescription medicine (does not control pain, nausea,  vomiting, rash, itching, etc), please call us [phone] see if we need to switch you to a different pain medicine that will work better for you and/or control your side effect better. ii. If you need a refill on your pain medication, please contact your pharmacy.  They will contact our office to request authorization. Prescriptions will not be filled after 5 pm or on week-ends. 4. KEEP YOUR BOWELS REGULAR a. The goal is one bowel movement a day b. Avoid getting constipated.  Between the surgery and the pain medications, it is common to experience some constipation.  Increasing fluid intake and taking a fiber supplement (such as Metamucil, Citrucel, FiberCon, MiraLax, etc) 1-2 times a day regularly will usually help prevent this problem from occurring.  A mild laxative (prune juice, Milk of Magnesia, MiraLax, etc) should be taken according to package directions if there are no bowel movements after 48 hours. c. Watch out for diarrhea.  If you have many loose bowel movements, simplify your diet to bland foods & liquids for a few days.  Stop any stool softeners and decrease your fiber supplement.  Switching to mild anti-diarrheal medications (Kayopectate, Pepto Bismol) can help.  If this worsens or does not improve, please call us.[email or web address]. Wound Care a. Remove your bandages the day after surgery.  Unless discharge instructions indicate otherwise, leave your bandage dry and in place overnight.  Remove the bandage during your first bowel movement.   b. Allow the wound packing to  fall out over the next few days.  You can trim exposed gauze / ribbon as it falls out.  You do not need to repack the wound unless instructed otherwise.  Wear an absorbent pad or soft cotton gauze in your underwear as needed to catch any drainage and help keep the area  c. Keep the area clean and dry.  Bathe / shower every day.  Keep the area clean by showering / bathing over the incision / wound.   It is okay to soak an open wound  to help wash it.  Wet wipes or showers / gentle washing after bowel movements is often less traumatic than regular toilet paper. d. Dennis Bast may have some styrofoam-like soft packing in the rectum which will come out with the first bowel movement.  e. You will often notice bleeding with bowel movements.  This should slow down by the end of the first week of surgery f. Expect some drainage.  This should slow down, too, by the end of the first week of surgery.  Wear an absorbent pad or soft cotton gauze in your underwear until the drainage stops. 6. ACTIVITIES as tolerated:   a. You may resume regular (light) daily activities beginning the next day-such as daily self-care, walking, climbing stairs-gradually increasing activities as tolerated.  If you can walk 30 minutes without difficulty, it is safe to try more intense activity such as jogging, treadmill, bicycling, low-impact aerobics, swimming, etc. b. Save the most intensive and strenuous activity for last such as sit-ups, heavy lifting, contact sports, etc  Refrain from any heavy lifting or straining until you are off narcotics for pain control.   c. DO NOT PUSH THROUGH PAIN.  Let pain be your guide: If it hurts to do something, don't do it.  Pain is your body warning you to avoid that activity for another week until the pain goes down. d. You may drive when you are no longer taking prescription pain medication, you can comfortably sit for long periods of time, and you can safely maneuver your car and apply brakes. e. Dennis Bast may have sexual intercourse when it is comfortable.  7. FOLLOW UP in our office a. Please call [facility] at [phone] to set up an appointment to see your surgeon in the office for a follow-up appointment approximately 2 weeks after your surgery. b. Make sure that you call for this appointment the day you arrive home to insure a convenient appointment time. 10. IF YOU HAVE DISABILITY OR FAMILY LEAVE FORMS, BRING THEM TO THE OFFICE FOR  PROCESSING.  DO NOT GIVE THEM TO YOUR DOCTOR.        WHEN TO CALL us [phone]: 1. Poor pain control 2. Reactions / problems with new medications (rash/itching, nausea, etc)  3. Fever over 101.5 F (38.5 C) 4. Inability to urinate 5. Nausea and/or vomiting 6. Worsening swelling or bruising 7. Continued bleeding from incision. 8. Increased pain, redness, or drainage from the incision  The clinic staff is available to answer your questions during regular business hours (8:30am-5pm).  Please don't hesitate to call and ask to speak to one of our nurses for clinical concerns.   A surgeon from [facility] is always on call at the hospitals   If you have a medical emergency, go to the nearest emergency room or call 911.    [facility], [place], [place], [place], [place]  [address] ? MAIN: [phone] ? TOLL FREE: [phone] ?  FAX [phone] [email or web address]   HEMORRHOIDS  The rectum is the last foot of your colon, and it naturally stretches to hold stool.  Hemorrhoidal piles are natural clusters of blood vessels that help the rectum and anal canal stretch to hold stool and allow bowel movements to eliminate feces.   Hemorrhoids are abnormally swollen blood vessels in the rectum.  Too much pressure in the rectum causes hemorrhoids by forcing blood to stretch and bulge the walls of the veins, sometimes even rupturing them.  Hemorrhoids can become like varicose veins you might see on a person's legs.  Most people will develop a flare of hemorrhoids in their lifetime.  When bulging hemorrhoidal veins are irritated, they can swell, burn, itch, cause pain, and bleed.  Most flares will calm down gradually own within a few weeks.  However, once hemorrhoids are created, they are difficult to get rid of completely and tend to flare more easily than the first flare.   Fortunately, good habits and simple medical treatment usually  control hemorrhoids well, and surgery is needed only in severe cases. Types of Hemorrhoids:  Internal hemorrhoids usually don't initially hurt or itch; they are deep inside the rectum and usually have no sensation. If they begin to push out (prolapse), pain and burning can occur.  However, internal hemorrhoids can bleed.  Anal bleeding should not be ignored since bleeding could come from a dangerous source like colorectal cancer, so persistent rectal bleeding should be investigated by a doctor, sometimes with a colonoscopy.  External hemorrhoids cause most of the symptoms - pain, burning, and itching. Nonirritated hemorrhoids can look like small skin tags coming out of the anus.   Thrombosed hemorrhoids can form when a hemorrhoid blood vessel bursts and causes the hemorrhoid to suddenly swell.  A purple blood clot can form in it and become an excruciatingly painful lump at the anus. Because of these unpleasant symptoms, immediate incision and drainage by a surgeon at an office visit can provide much relief of the pain.    PREVENTION Avoiding the most frequent causes listed below will prevent most cases of hemorrhoids: Constipation Hard stools Diarrhea  Constant sitting  Straining with bowel movements Sitting on the toilet for a long time  Severe coughing  episodes Pregnancy / Childbirth  Heavy Lifting  Sometimes avoiding the above triggers is difficult:  How can you avoid sitting all day if you have a seated job? Also, we try to avoid coughing and diarrhea, but sometimes it's beyond your control.  Still, there are some practical hints to help: Keep the anal and genital area clean.  Moistened tissues such as flushable wet wipes are less irritating than toilet paper.  Using irrigating showers or bottle irrigation washing gently cleans this sensitive area.   Avoid dry toilet paper when cleaning after bowel movements.  Marland Kitchen Keep the anal and genital area dry.  Lightly pat the rectal area dry.  Avoid  rubbing.  Talcum or baby powders can help GET YOUR STOOLS SOFT.   This is the most important way to prevent irritated hemorrhoids.  Hard stools are like sandpaper to the anorectal canal and will cause more problems.  The goal: ONE SOFT BOWEL MOVEMENT A DAY!  BMs from every other day to 3 times a day is a tolerable range Treat coughing, diarrhea and constipation early since irritated hemorrhoids may soon follow.  If your main job activity is seated, always stand or walk during your breaks. Make it a point to stand and  walk at least 5 minutes every hour and try to shift frequently in your chair to avoid direct rectal pressure.  Always exhale as you strain or lift. Don't hold your breath.  Do not delay or try to prevent a bowel movement when the urge is present. Exercise regularly (walking or jogging 60 minutes a day) to stimulate the bowels to move. No reading or other activity while on the toilet. If bowel movements take longer than 5 minutes, you are too constipated. AVOID CONSTIPATION Drink plenty of liquids (1 1/2 to 2 quarts of water and other fluids a day unless fluid restricted for another medical condition). Liquids that contain caffeine (coffee a, tea, soft drinks) can be dehydrating and should be avoided until constipation is controlled. Consider minimizing milk, as dairy products may be constipating. Eat plenty of fiber (30g a day ideal, more if needed).  Fiber is the undigested part of plant food that passes into the colon, acting as "natures broom" to encourage bowel motility and movement.  Fiber can absorb and hold large amounts of water. This results in a larger, bulkier stool, which is soft and easier to pass.  Eating foods high in fiber - 12 servings - such as  Vegetables: Root (potatoes, carrots, turnips), Leafy green (lettuce, salad greens, celery, spinach), High residue (cabbage, broccoli, etc.) Fruit: Fresh, Dried (prunes, apricots, cherries), Stewed (applesauce)  Whole grain breads,  pasta, whole wheat Bran cereals, muffins, etc. Consider adding supplemental bulking fiber which retains large volumes of water: Psyllium ground seeds --available as Metamucil, Konsyl, Effersyllium, Per Diem Fiber, or the less expensive generic forms.  Citrucel  (methylcellulose wood fiber) . FiberCon (Polycarbophil) Polyethylene Glycol - and "artificial" fiber commonly called Miralax or Glycolax.  It is helpful for people with gassy or bloated feelings with regular fiber Flax Seed - a less gassy natural fiber  Laxatives can be useful for a short period if constipation is severe Osmotics (Milk of Magnesia, Fleets Phospho-Soda, Magnesium Citrate)  Stimulants (Senokot,   Castor Oil,  Dulcolax, Ex-Lax)    Laxatives are not a good long-term solution as it can stress the bowels and cause too much mineral loss and dehydration.   Avoid taking laxatives for more than 7 days in a row.  AVOID DIARRHEA Switch to liquids and simpler foods for a few days to avoid stressing your intestines further. Avoid dairy products (especially milk & ice cream) for a short time.  The intestines often can lose the ability to digest lactose when stressed. Avoid foods that cause gassiness or bloating.  Typical foods include beans and other legumes, cabbage, broccoli, and dairy foods.  Every person has some sensitivity to other foods, so listen to your body and avoid those foods that trigger problems for you. Adding fiber (Citrucel, Metamucil, FiberCon, Flax seed, Miralax) gradually can help thicken stools by absorbing excess fluid and retrain the intestines to act more normally.  Slowly increase the dose over a few weeks.  Too much fiber too soon can backfire and cause cramping & bloating. Probiotics (such as active yogurt, Align, etc) may help repopulate the intestines and colon with normal bacteria and calm down a sensitive digestive tract.  Most studies show it to be of mild help, though, and such products can be  costly. Medicines: Bismuth subsalicylate (ex. Kayopectate, Pepto Bismol) every 30 minutes for up to 6 doses can help control diarrhea.  Avoid if pregnant. Loperamide (Immodium) can slow down diarrhea.  Start with two tablets (29m total) first and then  try one tablet every 6 hours.  Avoid if you are having fevers or severe pain.  If you are not better or start feeling worse, stop all medicines and call your doctor for advice Call your doctor if you are getting worse or not better.  Sometimes further testing (cultures, endoscopy, X-ray studies, bloodwork, etc) may be needed to help diagnose and treat the cause of the diarrhea. TREATMENT OF HEMORRHOID FLARE If these preventive measures fail, you must take action right away! Hemorrhoids are one condition that can be mild in the morning and become intolerable by nightfall. Most hemorrhoidal flares take several weeks to calm down.  These suggestions can help: Warm soaks.  This helps more than any topical medication.  Use up to 8 times a day.  Usually sitz baths or sitting in a warm bathtub helps.  Sitting on moist warm towels are helpful.  Switching to ice packs/cool compresses can be helpful Normalize your bowels.  Extremes of diarrhea or constipation will make hemorrhoids worse.  One soft bowel movement a day is the goal.  Fiber can help get your bowels regular Wet wipes instead of toilet paper Pain control with a NSAID such as ibuprofen (Advil) or naproxen (Aleve) or acetaminophen (Tylenol) around the clock.  Narcotics are constipating and should be minimized if possible Topical creams contain steroids (bydrocortisone) or local anesthetic (xylocaine) can help make pain and itching more tolerable.   EVALUATION If hemorrhoids are still causing problems, you could benefit by an evaluation by a surgeon.  The surgeon will obtain a history and examine you.  If hemorrhoids are diagnosed, some therapies can be offered in the office, usually with an anoscope into  the less sensitive area of the rectum: -injection of hemorrhoids (sclerotherapy) can scar the blood vessels of the swollen/enlarged hemorrhoids to help shrink them down to a more normal size -rubber banding of the enlarged hemorrhoids to help shrink them down to a more normal size -drainage of the blood clot causing a thrombosed hemorrhoid,  to relieve the severe pain   While 90% of the time such problems from hemorrhoids can be managed without preceding to surgery, sometimes the hemorrhoids require a operation to control the problem (uncontrolled bleeding, prolapse, pain, etc.).   This involves being placed under general anesthesia where the surgeon can confirm the diagnosis and remove, suture, or staple the hemorrhoid(s).  Your surgeon can help you treat the problem appropriately.    GETTING TO GOOD BOWEL HEALTH. Irregular bowel habits such as constipation and diarrhea can lead to many problems over time.  Having one soft bowel movement a day is the most important way to prevent further problems.  The anorectal canal is designed to handle stretching and feces to safely manage our ability to get rid of solid waste (feces, poop, stool) out of our body.  BUT, hard constipated stools can act like ripping concrete bricks and diarrhea can be a burning fire to this very sensitive area of our body, causing inflamed hemorrhoids, anal fissures, increasing risk is perirectal abscesses, abdominal pain/bloating, an making irritable bowel worse.     The goal: ONE SOFT BOWEL MOVEMENT A DAY!  To have soft, regular bowel movements:    Drink at least 8 tall glasses of water a day.     Take plenty of fiber.  Fiber is the undigested part of plant food that passes into the colon, acting s "natures broom" to encourage bowel motility and movement.  Fiber can absorb and hold large amounts  of water. This results in a larger, bulkier stool, which is soft and easier to pass. Work gradually over several weeks up to 6 servings a  day of fiber (25g a day even more if needed) in the form of: o Vegetables -- Root (potatoes, carrots, turnips), leafy green (lettuce, salad greens, celery, spinach), or cooked high residue (cabbage, broccoli, etc) o Fruit -- Fresh (unpeeled skin & pulp), Dried (prunes, apricots, cherries, etc ),  or stewed ( applesauce)  o Whole grain breads, pasta, etc (whole wheat)  o Bran cereals    Bulking Agents -- This type of water-retaining fiber generally is easily obtained each day by one of the following:  o Psyllium bran -- The psyllium plant is remarkable because its ground seeds can retain so much water. This product is available as Metamucil, Konsyl, Effersyllium, Per Diem Fiber, or the less expensive generic preparation in drug and health food stores. Although labeled a laxative, it really is not a laxative.  o Methylcellulose -- This is another fiber derived from wood which also retains water. It is available as Citrucel. o Polyethylene Glycol - and "artificial" fiber commonly called Miralax or Glycolax.  It is helpful for people with gassy or bloated feelings with regular fiber o Flax Seed - a less gassy fiber than psyllium   No reading or other relaxing activity while on the toilet. If bowel movements take longer than 5 minutes, you are too constipated   AVOID CONSTIPATION.  High fiber and water intake usually takes care of this.  Sometimes a laxative is needed to stimulate more frequent bowel movements, but    Laxatives are not a good long-term solution as it can wear the colon out. o Osmotics (Milk of Magnesia, Fleets phosphosoda, Magnesium citrate, MiraLax, GoLytely) are safer than  o Stimulants (Senokot, Castor Oil, Dulcolax, Ex Lax)    o Do not take laxatives for more than 7days in a row.    IF SEVERELY CONSTIPATED, try a Bowel Retraining Program: o Do not use laxatives.  o Eat a diet high in roughage, such as bran cereals and leafy vegetables.  o Drink six (6) ounces of prune or apricot  juice each morning.  o Eat two (2) large servings of stewed fruit each day.  o Take one (1) heaping tablespoon of a psyllium-based bulking agent twice a day. Use sugar-free sweetener when possible to avoid excessive calories.  o Eat a normal breakfast.  o Set aside 15 minutes after breakfast to sit on the toilet, but do not strain to have a bowel movement.  o If you do not have a bowel movement by the third day, use an enema and repeat the above steps.    Controlling diarrhea o Switch to liquids and simpler foods for a few days to avoid stressing your intestines further. o Avoid dairy products (especially milk & ice cream) for a short time.  The intestines often can lose the ability to digest lactose when stressed. o Avoid foods that cause gassiness or bloating.  Typical foods include beans and other legumes, cabbage, broccoli, and dairy foods.  Every person has some sensitivity to other foods, so listen to our body and avoid those foods that trigger problems for you. o Adding fiber (Citrucel, Metamucil, psyllium, Miralax) gradually can help thicken stools by absorbing excess fluid and retrain the intestines to act more normally.  Slowly increase the dose over a few weeks.  Too much fiber too soon can backfire and cause cramping & bloating.  o Probiotics (such as active yogurt, Align, etc) may help repopulate the intestines and colon with normal bacteria and calm down a sensitive digestive tract.  Most studies show it to be of mild help, though, and such products can be costly. o Medicines:   Bismuth subsalicylate (ex. Kayopectate, Pepto Bismol) every 30 minutes for up to 6 doses can help control diarrhea.  Avoid if pregnant.   Loperamide (Immodium) can slow down diarrhea.  Start with two tablets (58m total) first and then try one tablet every 6 hours.  Avoid if you are having fevers or severe pain.  If you are not better or start feeling worse, stop all medicines and call your doctor for advice o Call  your doctor if you are getting worse or not better.  Sometimes further testing (cultures, endoscopy, X-ray studies, bloodwork, etc) may be needed to help diagnose and treat the cause of the diarrhea. o

## 2012-12-06 ENCOUNTER — Other Ambulatory Visit (INDEPENDENT_AMBULATORY_CARE_PROVIDER_SITE_OTHER): Payer: Self-pay | Admitting: Surgery

## 2012-12-06 DIAGNOSIS — K644 Residual hemorrhoidal skin tags: Secondary | ICD-10-CM

## 2012-12-06 DIAGNOSIS — K648 Other hemorrhoids: Secondary | ICD-10-CM

## 2012-12-10 ENCOUNTER — Telehealth (INDEPENDENT_AMBULATORY_CARE_PROVIDER_SITE_OTHER): Payer: Self-pay

## 2012-12-10 NOTE — Telephone Encounter (Signed)
Called pt to notify him of his pathology report to show hemorrhoids no cancer per Dr Johney Maine. I asked how the pt was doing after surgery and he is really sore. 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 I advised pt that it will be no problem getting him a note to be out of work approximately 2-3 wks from the date of surgery. I advised pt that if he feels better in 2wks then he can go back sooner if he would like but right now he needs to focus on pain control, soft bowel movements,and doing warm sitz baths. I was going to make the pt a f/u appt with Dr Johney Maine in the next couple of weeks but the pt will call back b/c he didn't have his schedule in front of him. I will wait for his call to do the RTW note for his job.

## 2012-12-26 ENCOUNTER — Encounter (INDEPENDENT_AMBULATORY_CARE_PROVIDER_SITE_OTHER): Payer: Self-pay | Admitting: Surgery

## 2012-12-26 ENCOUNTER — Ambulatory Visit (INDEPENDENT_AMBULATORY_CARE_PROVIDER_SITE_OTHER): Payer: Federal, State, Local not specified - PPO | Admitting: Surgery

## 2012-12-26 VITALS — BP 132/68 | HR 72 | Temp 97.2°F | Resp 16 | Ht 68.0 in | Wt 176.0 lb

## 2012-12-26 DIAGNOSIS — K645 Perianal venous thrombosis: Secondary | ICD-10-CM

## 2012-12-26 NOTE — Progress Notes (Signed)
Subjective:     Patient ID: Terry Kerr, male   DOB: 1955/10/06, 57 y.o.   MRN: [id number]  HPI  Terry Kerr  Feb 12, 1956 [id number]  Patient Care Team: Janalyn Rouse, MD as PCP - General (Internal Medicine) Sable Feil, MD as Attending Physician (Gastroenterology)  This patient is a 57 y.o.male who presents today for surgical evaluation status post hemorrhoidal excision/decompression/pexy 12/06/2012:  Diagnosis Hemorrhoids - FINDINGS CONSISTENT WITH HEMORRHOIDS. - NO EVIDENCE OF MALIGNANCY. [name] MD Pathologist, Electronic Signature (Case signed 12/09/2012)  Patient comes in today feeling better.  Try to get back to work around postop day five.  Last part of the day.  Now lasting most of day.  No more bleeding.  Soreness going down.  Feels much better than before surgery.  No fevers or chills.  Daily bowel movements.  He can sit down normally now.  No longer afraid of prolonged sitting like he had been the past few years, a happy surprise for him.  Overall quite pleased with the results.  He comes it today by himself.  Patient Active Problem List   Diagnosis Date Noted  . Diverticulosis 12/04/2012  . Hemorrhoids, internal, thrombosed s/p resection/pexy 12/06/2012 12/04/2012  . HERPES SIMPLEX INFECTION 08/01/2007  . HYPERCHOLESTEROLEMIA 08/01/2007  . CROHN'S DISEASE 08/01/2007    Past Medical History  Diagnosis Date  . Allergic rhinitis     pollen, animal hair, dust mites  . HLD (hyperlipidemia)   . Crohn disease   . Hemorrhoids, internal, thrombosed s/p resection/pexy 12/06/2012 12/04/2012    Past Surgical History  Procedure Laterality Date  . Appendectomy    . Tonsillectomy    . Laminectomy    . Hemorroidectomy      History   Social History  . Marital Status: Married    Spouse Name: N/A    Number of Children: N/A  . Years of Education: N/A   Occupational History  . Not on file.   Social History Main Topics  . Smoking status: Never Smoker   .  Smokeless tobacco: Not on file  . Alcohol Use: Yes  . Drug Use: No  . Sexually Active: Not on file   Other Topics Concern  . Not on file   Social History Narrative  . No narrative on file    History reviewed. No pertinent family history.  Current Outpatient Prescriptions  Medication Sig Dispense Refill  . simvastatin (ZOCOR) 80 MG tablet Take 80 mg by mouth at bedtime.       No current facility-administered medications for this visit.     No Known Allergies  BP 132/68  Pulse 72  Temp(Src) 97.2 F (36.2 C) (Temporal)  Resp 16  Ht 5' 8"  (1.727 m)  Wt 176 lb (79.833 kg)  BMI 26.77 kg/m2  No results found.   Review of Systems  Constitutional: Negative for fever, chills and diaphoresis.  HENT: Negative for sore throat, trouble swallowing and neck pain.   Eyes: Negative for photophobia and visual disturbance.  Respiratory: Negative for choking and shortness of breath.   Cardiovascular: Negative for chest pain and palpitations.  Gastrointestinal: Negative for nausea, vomiting, abdominal distention, anal bleeding and rectal pain.  Genitourinary: Negative for dysuria, urgency, difficulty urinating and testicular pain.  Musculoskeletal: Negative for myalgias, arthralgias and gait problem.  Skin: Negative for color change and rash.  Neurological: Negative for dizziness, speech difficulty, weakness and numbness.  Hematological: Negative for adenopathy.  Psychiatric/Behavioral: Negative for hallucinations, confusion and agitation.  Objective:   Physical Exam  Constitutional: He is oriented to person, place, and time. He appears well-developed and well-nourished. No distress.  HENT:  Head: Normocephalic.  Mouth/Throat: Oropharynx is clear and moist. No oropharyngeal exudate.  Eyes: Conjunctivae and EOM are normal. Pupils are equal, round, and reactive to light. No scleral icterus.  Neck: Normal range of motion. No tracheal deviation present.  Cardiovascular: Normal  rate, normal heart sounds and intact distal pulses.   Pulmonary/Chest: Effort normal. No respiratory distress.  Abdominal: Soft. He exhibits no distension. There is no tenderness. Hernia confirmed negative in the right inguinal area and confirmed negative in the left inguinal area.  Incisions clean with normal healing ridges.  No hernias  Genitourinary:  Perianal skin clean with good hygiene.  No pruritis.  No external skin tags / hemorrhoids of significance.  No pilonidal disease.  No fissure.  No abscess/fistula.   Normal sphincter tone.  Mild right lateral swelling but no severe hemorrhoids.  Mild mucus drainage.  No abscess.  Musculoskeletal: Normal range of motion. He exhibits no tenderness.  Neurological: He is alert and oriented to person, place, and time. No cranial nerve deficit. He exhibits normal muscle tone. Coordination normal.  Skin: Skin is warm and dry. No rash noted. He is not diaphoretic.  Psychiatric: He has a normal mood and affect. His behavior is normal.       Assessment:     Recovering markedly well status post urgent hemorrhoidectomy/pexy.     Plan:     I am glad that he is alert he much better than he was preop had.  He should continue to improve.  Increase activity as tolerated to regular activity.  Low impact exercise such as walking an hour a day at least ideal.  Do not push through pain.  Diet as tolerated.  Low fat high fiber diet ideal.  Bowel regimen with 30 g fiber a day and fiber supplement as needed to avoid problems.  Return to clinic In order to make sure that he is still improving.  If markedly improved, he can cancel the appointment and return to clinic as needed.   Instructions discussed.  Followup with primary care physician for other health issues as would normally be done.  Questions answered.  The patient expressed understanding and appreciation

## 2012-12-26 NOTE — Patient Instructions (Addendum)
ANORECTAL SURGERY: POST OP INSTRUCTIONS  1. Take your usually prescribed home medications unless otherwise directed. 2. DIET: Follow a light bland diet the first 24 hours after arrival home, such as soup, liquids, crackers, etc.  Be sure to include lots of fluids daily.  Avoid fast food or heavy meals as your are more likely to get nauseated.  Eat a low fat the next few days after surgery.   3. PAIN CONTROL: a. Pain is best controlled by a usual combination of three different methods TOGETHER: i. Ice/Heat ii. Over the counter pain medication iii. Prescription pain medication b. Most patients will experience some swelling and discomfort in the anus/rectal area. and incisions.  Ice packs or heat (30-60 minutes up to 6 times a day) will help. Use ice for the first few days to help decrease swelling and bruising, then switch to heat such as warm towels, sitz baths, warm baths, etc to help relax tight/sore spots and speed recovery.  Some people prefer to use ice alone, heat alone, alternating between ice & heat.  Experiment to what works for you.  Swelling and bruising can take several weeks to resolve.   c. It is helpful to take an over-the-counter pain medication regularly for the first few weeks.  Choose one of the following that works best for you: i. Naproxen (Aleve, etc)  Two 253m tabs twice a day ii. Ibuprofen (Advil, etc) Three 203mtabs four times a day (every meal & bedtime) iii. Acetaminophen (Tylenol, etc) 500-65071mour times a day (every meal & bedtime) d. A  prescription for pain medication (such as oxycodone, hydrocodone, etc) should be given to you upon discharge.  Take your pain medication as prescribed.  i. If you are having problems/concerns with the prescription medicine (does not control pain, nausea, vomiting, rash, itching, etc), please call us [phone] see if we need to switch you to a different pain medicine that will work better for you and/or control your side effect  better. ii. If you need a refill on your pain medication, please contact your pharmacy.  They will contact our office to request authorization. Prescriptions will not be filled after 5 pm or on week-ends. 4. KEEP YOUR BOWELS REGULAR a. The goal is one bowel movement a day b. Avoid getting constipated.  Between the surgery and the pain medications, it is common to experience some constipation.  Increasing fluid intake and taking a fiber supplement (such as Metamucil, Citrucel, FiberCon, MiraLax, etc) 1-2 times a day regularly will usually help prevent this problem from occurring.  A mild laxative (prune juice, Milk of Magnesia, MiraLax, etc) should be taken according to package directions if there are no bowel movements after 48 hours. c. Watch out for diarrhea.  If you have many loose bowel movements, simplify your diet to bland foods & liquids for a few days.  Stop any stool softeners and decrease your fiber supplement.  Switching to mild anti-diarrheal medications (Kayopectate, Pepto Bismol) can help.  If this worsens or does not improve, please call us.[email or web address]. Wound Care a. Remove your bandages the day after surgery.  Unless discharge instructions indicate otherwise, leave your bandage dry and in place overnight.  Remove the bandage during your first bowel movement.   b. Allow the wound packing to fall out over the next few days.  You can trim exposed gauze / ribbon as it falls out.  You do not need to repack the wound unless instructed otherwise.  Wear an  absorbent pad or soft cotton gauze in your underwear as needed to catch any drainage and help keep the area  c. Keep the area clean and dry.  Bathe / shower every day.  Keep the area clean by showering / bathing over the incision / wound.   It is okay to soak an open wound to help wash it.  Wet wipes or showers / gentle washing after bowel movements is often less traumatic than regular toilet paper. d. Dennis Bast may have some styrofoam-like soft packing in  the rectum which will come out with the first bowel movement.  e. You will often notice bleeding with bowel movements.  This should slow down by the end of the first week of surgery f. Expect some drainage.  This should slow down, too, by the end of the first week of surgery.  Wear an absorbent pad or soft cotton gauze in your underwear until the drainage stops. 6. ACTIVITIES as tolerated:   a. You may resume regular (light) daily activities beginning the next day-such as daily self-care, walking, climbing stairs-gradually increasing activities as tolerated.  If you can walk 30 minutes without difficulty, it is safe to try more intense activity such as jogging, treadmill, bicycling, low-impact aerobics, swimming, etc. b. Save the most intensive and strenuous activity for last such as sit-ups, heavy lifting, contact sports, etc  Refrain from any heavy lifting or straining until you are off narcotics for pain control.   c. DO NOT PUSH THROUGH PAIN.  Let pain be your guide: If it hurts to do something, don't do it.  Pain is your body warning you to avoid that activity for another week until the pain goes down. d. You may drive when you are no longer taking prescription pain medication, you can comfortably sit for long periods of time, and you can safely maneuver your car and apply brakes. e. Dennis Bast may have sexual intercourse when it is comfortable.  7. FOLLOW UP in our office a. Please call [facility] at [phone] to set up an appointment to see your surgeon in the office for a follow-up appointment approximately 2 weeks after your surgery. b. Make sure that you call for this appointment the day you arrive home to insure a convenient appointment time. 10. IF YOU HAVE DISABILITY OR FAMILY LEAVE FORMS, BRING THEM TO THE OFFICE FOR PROCESSING.  DO NOT GIVE THEM TO YOUR DOCTOR.        WHEN TO CALL us [phone]: 1. Poor pain control 2. Reactions / problems with new medications (rash/itching, nausea,  etc)  3. Fever over 101.5 F (38.5 C) 4. Inability to urinate 5. Nausea and/or vomiting 6. Worsening swelling or bruising 7. Continued bleeding from incision. 8. Increased pain, redness, or drainage from the incision  The clinic staff is available to answer your questions during regular business hours (8:30am-5pm).  Please don't hesitate to call and ask to speak to one of our nurses for clinical concerns.   A surgeon from [facility] is always on call at the hospitals   If you have a medical emergency, go to the nearest emergency room or call 911.    [facility], [place], [place], [place], [place]  [address] ? MAIN: [phone] ? TOLL FREE: [phone] ? FAX [phone] [email or web address]  HEMORRHOIDS  The rectum is the last foot of your colon, and it naturally stretches to hold stool.  Hemorrhoidal piles are natural clusters of blood vessels that  help the rectum and anal canal stretch to hold stool and allow bowel movements to eliminate feces.   Hemorrhoids are abnormally swollen blood vessels in the rectum.  Too much pressure in the rectum causes hemorrhoids by forcing blood to stretch and bulge the walls of the veins, sometimes even rupturing them.  Hemorrhoids can become like varicose veins you might see on a person's legs.  Most people will develop a flare of hemorrhoids in their lifetime.  When bulging hemorrhoidal veins are irritated, they can swell, burn, itch, cause pain, and bleed.  Most flares will calm down gradually own within a few weeks.  However, once hemorrhoids are created, they are difficult to get rid of completely and tend to flare more easily than the first flare.   Fortunately, good habits and simple medical treatment usually control hemorrhoids well, and surgery is needed only in severe cases. Types of Hemorrhoids:  Internal hemorrhoids usually don't initially hurt or itch; they are deep inside the rectum  and usually have no sensation. If they begin to push out (prolapse), pain and burning can occur.  However, internal hemorrhoids can bleed.  Anal bleeding should not be ignored since bleeding could come from a dangerous source like colorectal cancer, so persistent rectal bleeding should be investigated by a doctor, sometimes with a colonoscopy.  External hemorrhoids cause most of the symptoms - pain, burning, and itching. Nonirritated hemorrhoids can look like small skin tags coming out of the anus.   Thrombosed hemorrhoids can form when a hemorrhoid blood vessel bursts and causes the hemorrhoid to suddenly swell.  A purple blood clot can form in it and become an excruciatingly painful lump at the anus. Because of these unpleasant symptoms, immediate incision and drainage by a surgeon at an office visit can provide much relief of the pain.    PREVENTION Avoiding the most frequent causes listed below will prevent most cases of hemorrhoids: Constipation Hard stools Diarrhea  Constant sitting  Straining with bowel movements Sitting on the toilet for a long time  Severe coughing  episodes Pregnancy / Childbirth  Heavy Lifting  Sometimes avoiding the above triggers is difficult:  How can you avoid sitting all day if you have a seated job? Also, we try to avoid coughing and diarrhea, but sometimes it's beyond your control.  Still, there are some practical hints to help: Keep the anal and genital area clean.  Moistened tissues such as flushable wet wipes are less irritating than toilet paper.  Using irrigating showers or bottle irrigation washing gently cleans this sensitive area.   Avoid dry toilet paper when cleaning after bowel movements.  Marland Kitchen Keep the anal and genital area dry.  Lightly pat the rectal area dry.  Avoid rubbing.  Talcum or baby powders can help GET YOUR STOOLS SOFT.   This is the most important way to prevent irritated hemorrhoids.  Hard stools are like sandpaper to the anorectal canal and  will cause more problems.  The goal: ONE SOFT BOWEL MOVEMENT A DAY!  BMs from every other day to 3 times a day is a tolerable range Treat coughing, diarrhea and constipation early since irritated hemorrhoids may soon follow.  If your main job activity is seated, always stand or walk during your breaks. Make it a point to stand and walk at least 5 minutes every hour and try to shift frequently in your chair to avoid direct rectal pressure.  Always exhale as you strain or lift. Don't hold your breath.  Do  not delay or try to prevent a bowel movement when the urge is present. Exercise regularly (walking or jogging 60 minutes a day) to stimulate the bowels to move. No reading or other activity while on the toilet. If bowel movements take longer than 5 minutes, you are too constipated. AVOID CONSTIPATION Drink plenty of liquids (1 1/2 to 2 quarts of water and other fluids a day unless fluid restricted for another medical condition). Liquids that contain caffeine (coffee a, tea, soft drinks) can be dehydrating and should be avoided until constipation is controlled. Consider minimizing milk, as dairy products may be constipating. Eat plenty of fiber (30g a day ideal, more if needed).  Fiber is the undigested part of plant food that passes into the colon, acting as "natures broom" to encourage bowel motility and movement.  Fiber can absorb and hold large amounts of water. This results in a larger, bulkier stool, which is soft and easier to pass.  Eating foods high in fiber - 12 servings - such as  Vegetables: Root (potatoes, carrots, turnips), Leafy green (lettuce, salad greens, celery, spinach), High residue (cabbage, broccoli, etc.) Fruit: Fresh, Dried (prunes, apricots, cherries), Stewed (applesauce)  Whole grain breads, pasta, whole wheat Bran cereals, muffins, etc. Consider adding supplemental bulking fiber which retains large volumes of water: Psyllium ground seeds --available as Metamucil, Konsyl,  Effersyllium, Per Diem Fiber, or the less expensive generic forms.  Citrucel  (methylcellulose wood fiber) . FiberCon (Polycarbophil) Polyethylene Glycol - and "artificial" fiber commonly called Miralax or Glycolax.  It is helpful for people with gassy or bloated feelings with regular fiber Flax Seed - a less gassy natural fiber  Laxatives can be useful for a short period if constipation is severe Osmotics (Milk of Magnesia, Fleets Phospho-Soda, Magnesium Citrate)  Stimulants (Senokot,   Castor Oil,  Dulcolax, Ex-Lax)    Laxatives are not a good long-term solution as it can stress the bowels and cause too much mineral loss and dehydration.   Avoid taking laxatives for more than 7 days in a row.  AVOID DIARRHEA Switch to liquids and simpler foods for a few days to avoid stressing your intestines further. Avoid dairy products (especially milk & ice cream) for a short time.  The intestines often can lose the ability to digest lactose when stressed. Avoid foods that cause gassiness or bloating.  Typical foods include beans and other legumes, cabbage, broccoli, and dairy foods.  Every person has some sensitivity to other foods, so listen to your body and avoid those foods that trigger problems for you. Adding fiber (Citrucel, Metamucil, FiberCon, Flax seed, Miralax) gradually can help thicken stools by absorbing excess fluid and retrain the intestines to act more normally.  Slowly increase the dose over a few weeks.  Too much fiber too soon can backfire and cause cramping & bloating. Probiotics (such as active yogurt, Align, etc) may help repopulate the intestines and colon with normal bacteria and calm down a sensitive digestive tract.  Most studies show it to be of mild help, though, and such products can be costly. Medicines: Bismuth subsalicylate (ex. Kayopectate, Pepto Bismol) every 30 minutes for up to 6 doses can help control diarrhea.  Avoid if pregnant. Loperamide (Immodium) can slow down  diarrhea.  Start with two tablets (33m total) first and then try one tablet every 6 hours.  Avoid if you are having fevers or severe pain.  If you are not better or start feeling worse, stop all medicines and call your doctor for  advice Call your doctor if you are getting worse or not better.  Sometimes further testing (cultures, endoscopy, X-ray studies, bloodwork, etc) may be needed to help diagnose and treat the cause of the diarrhea. TREATMENT OF HEMORRHOID FLARE If these preventive measures fail, you must take action right away! Hemorrhoids are one condition that can be mild in the morning and become intolerable by nightfall. Most hemorrhoidal flares take several weeks to calm down.  These suggestions can help: Warm soaks.  This helps more than any topical medication.  Use up to 8 times a day.  Usually sitz baths or sitting in a warm bathtub helps.  Sitting on moist warm towels are helpful.  Switching to ice packs/cool compresses can be helpful Normalize your bowels.  Extremes of diarrhea or constipation will make hemorrhoids worse.  One soft bowel movement a day is the goal.  Fiber can help get your bowels regular Wet wipes instead of toilet paper Pain control with a NSAID such as ibuprofen (Advil) or naproxen (Aleve) or acetaminophen (Tylenol) around the clock.  Narcotics are constipating and should be minimized if possible Topical creams contain steroids (bydrocortisone) or local anesthetic (xylocaine) can help make pain and itching more tolerable.   EVALUATION If hemorrhoids are still causing problems, you could benefit by an evaluation by a surgeon.  The surgeon will obtain a history and examine you.  If hemorrhoids are diagnosed, some therapies can be offered in the office, usually with an anoscope into the less sensitive area of the rectum: -injection of hemorrhoids (sclerotherapy) can scar the blood vessels of the swollen/enlarged hemorrhoids to help shrink them down to a more normal  size -rubber banding of the enlarged hemorrhoids to help shrink them down to a more normal size -drainage of the blood clot causing a thrombosed hemorrhoid,  to relieve the severe pain   While 90% of the time such problems from hemorrhoids can be managed without preceding to surgery, sometimes the hemorrhoids require a operation to control the problem (uncontrolled bleeding, prolapse, pain, etc.).   This involves being placed under general anesthesia where the surgeon can confirm the diagnosis and remove, suture, or staple the hemorrhoid(s).  Your surgeon can help you treat the problem appropriately.    GETTING TO GOOD BOWEL HEALTH. Irregular bowel habits such as constipation and diarrhea can lead to many problems over time.  Having one soft bowel movement a day is the most important way to prevent further problems.  The anorectal canal is designed to handle stretching and feces to safely manage our ability to get rid of solid waste (feces, poop, stool) out of our body.  BUT, hard constipated stools can act like ripping concrete bricks and diarrhea can be a burning fire to this very sensitive area of our body, causing inflamed hemorrhoids, anal fissures, increasing risk is perirectal abscesses, abdominal pain/bloating, an making irritable bowel worse.     The goal: ONE SOFT BOWEL MOVEMENT A DAY!  To have soft, regular bowel movements:    Drink at least 8 tall glasses of water a day.     Take plenty of fiber.  Fiber is the undigested part of plant food that passes into the colon, acting s "natures broom" to encourage bowel motility and movement.  Fiber can absorb and hold large amounts of water. This results in a larger, bulkier stool, which is soft and easier to pass. Work gradually over several weeks up to 6 servings a day of fiber (25g a day even more  if needed) in the form of: o Vegetables -- Root (potatoes, carrots, turnips), leafy green (lettuce, salad greens, celery, spinach), or cooked high residue  (cabbage, broccoli, etc) o Fruit -- Fresh (unpeeled skin & pulp), Dried (prunes, apricots, cherries, etc ),  or stewed ( applesauce)  o Whole grain breads, pasta, etc (whole wheat)  o Bran cereals    Bulking Agents -- This type of water-retaining fiber generally is easily obtained each day by one of the following:  o Psyllium bran -- The psyllium plant is remarkable because its ground seeds can retain so much water. This product is available as Metamucil, Konsyl, Effersyllium, Per Diem Fiber, or the less expensive generic preparation in drug and health food stores. Although labeled a laxative, it really is not a laxative.  o Methylcellulose -- This is another fiber derived from wood which also retains water. It is available as Citrucel. o Polyethylene Glycol - and "artificial" fiber commonly called Miralax or Glycolax.  It is helpful for people with gassy or bloated feelings with regular fiber o Flax Seed - a less gassy fiber than psyllium   No reading or other relaxing activity while on the toilet. If bowel movements take longer than 5 minutes, you are too constipated   AVOID CONSTIPATION.  High fiber and water intake usually takes care of this.  Sometimes a laxative is needed to stimulate more frequent bowel movements, but    Laxatives are not a good long-term solution as it can wear the colon out. o Osmotics (Milk of Magnesia, Fleets phosphosoda, Magnesium citrate, MiraLax, GoLytely) are safer than  o Stimulants (Senokot, Castor Oil, Dulcolax, Ex Lax)    o Do not take laxatives for more than 7days in a row.    IF SEVERELY CONSTIPATED, try a Bowel Retraining Program: o Do not use laxatives.  o Eat a diet high in roughage, such as bran cereals and leafy vegetables.  o Drink six (6) ounces of prune or apricot juice each morning.  o Eat two (2) large servings of stewed fruit each day.  o Take one (1) heaping tablespoon of a psyllium-based bulking agent twice a day. Use sugar-free sweetener when  possible to avoid excessive calories.  o Eat a normal breakfast.  o Set aside 15 minutes after breakfast to sit on the toilet, but do not strain to have a bowel movement.  o If you do not have a bowel movement by the third day, use an enema and repeat the above steps.    Controlling diarrhea o Switch to liquids and simpler foods for a few days to avoid stressing your intestines further. o Avoid dairy products (especially milk & ice cream) for a short time.  The intestines often can lose the ability to digest lactose when stressed. o Avoid foods that cause gassiness or bloating.  Typical foods include beans and other legumes, cabbage, broccoli, and dairy foods.  Every person has some sensitivity to other foods, so listen to our body and avoid those foods that trigger problems for you. o Adding fiber (Citrucel, Metamucil, psyllium, Miralax) gradually can help thicken stools by absorbing excess fluid and retrain the intestines to act more normally.  Slowly increase the dose over a few weeks.  Too much fiber too soon can backfire and cause cramping & bloating. o Probiotics (such as active yogurt, Align, etc) may help repopulate the intestines and colon with normal bacteria and calm down a sensitive digestive tract.  Most studies show it to be of mild  help, though, and such products can be costly. o Medicines:   Bismuth subsalicylate (ex. Kayopectate, Pepto Bismol) every 30 minutes for up to 6 doses can help control diarrhea.  Avoid if pregnant.   Loperamide (Immodium) can slow down diarrhea.  Start with two tablets (76m total) first and then try one tablet every 6 hours.  Avoid if you are having fevers or severe pain.  If you are not better or start feeling worse, stop all medicines and call your doctor for advice o Call your doctor if you are getting worse or not better.  Sometimes further testing (cultures, endoscopy, X-ray studies, bloodwork, etc) may be needed to help diagnose and treat the cause of the  diarrhea. o

## 2014-01-20 ENCOUNTER — Encounter: Payer: Self-pay | Admitting: Internal Medicine

## 2014-04-03 ENCOUNTER — Encounter: Payer: Self-pay | Admitting: Internal Medicine

## 2014-06-04 ENCOUNTER — Encounter: Payer: Self-pay | Admitting: Internal Medicine

## 2014-06-04 ENCOUNTER — Other Ambulatory Visit (INDEPENDENT_AMBULATORY_CARE_PROVIDER_SITE_OTHER): Payer: Federal, State, Local not specified - PPO

## 2014-06-04 ENCOUNTER — Ambulatory Visit (INDEPENDENT_AMBULATORY_CARE_PROVIDER_SITE_OTHER): Payer: Federal, State, Local not specified - PPO | Admitting: Internal Medicine

## 2014-06-04 VITALS — BP 126/72 | HR 84 | Ht 68.0 in | Wt 188.4 lb

## 2014-06-04 DIAGNOSIS — K5090 Crohn's disease, unspecified, without complications: Secondary | ICD-10-CM

## 2014-06-04 DIAGNOSIS — Z1211 Encounter for screening for malignant neoplasm of colon: Secondary | ICD-10-CM

## 2014-06-04 DIAGNOSIS — Z23 Encounter for immunization: Secondary | ICD-10-CM

## 2014-06-04 LAB — CBC WITH DIFFERENTIAL/PLATELET
BASOS ABS: 0 10*3/uL (ref 0.0–0.1)
Basophils Relative: 0.3 % (ref 0.0–3.0)
Eosinophils Absolute: 0.1 10*3/uL (ref 0.0–0.7)
Eosinophils Relative: 1.3 % (ref 0.0–5.0)
HCT: 47 % (ref 39.0–52.0)
Hemoglobin: 15.8 g/dL (ref 13.0–17.0)
LYMPHS PCT: 17.7 % (ref 12.0–46.0)
Lymphs Abs: 1.3 10*3/uL (ref 0.7–4.0)
MCHC: 33.7 g/dL (ref 30.0–36.0)
MCV: 88.6 fl (ref 78.0–100.0)
MONOS PCT: 7.8 % (ref 3.0–12.0)
Monocytes Absolute: 0.6 10*3/uL (ref 0.1–1.0)
Neutro Abs: 5.2 10*3/uL (ref 1.4–7.7)
Neutrophils Relative %: 72.9 % (ref 43.0–77.0)
PLATELETS: 257 10*3/uL (ref 150.0–400.0)
RBC: 5.31 Mil/uL (ref 4.22–5.81)
RDW: 13.2 % (ref 11.5–15.5)
WBC: 7.1 10*3/uL (ref 4.0–10.5)

## 2014-06-04 LAB — COMPREHENSIVE METABOLIC PANEL
ALBUMIN: 4 g/dL (ref 3.5–5.2)
ALK PHOS: 73 U/L (ref 39–117)
ALT: 29 U/L (ref 0–53)
AST: 18 U/L (ref 0–37)
BILIRUBIN TOTAL: 0.8 mg/dL (ref 0.2–1.2)
BUN: 16 mg/dL (ref 6–23)
CO2: 28 mEq/L (ref 19–32)
Calcium: 9.2 mg/dL (ref 8.4–10.5)
Chloride: 106 mEq/L (ref 96–112)
Creatinine, Ser: 1 mg/dL (ref 0.4–1.5)
GFR: 84.26 mL/min (ref >60.00)
GLUCOSE: 105 mg/dL — AB (ref 70–99)
Potassium: 4.7 mEq/L (ref 3.5–5.1)
Sodium: 140 mEq/L (ref 135–145)
Total Protein: 7 g/dL (ref 6.0–8.3)

## 2014-06-04 LAB — IGA: IgA: 178 mg/dL (ref 68–378)

## 2014-06-04 LAB — FOLATE: Folate: >24.6 ng/mL (ref >5.9)

## 2014-06-04 LAB — VITAMIN B12: Vitamin B-12: 228 pg/mL (ref 211–911)

## 2014-06-04 LAB — HIGH SENSITIVITY CRP: CRP HIGH SENSITIVITY: 0.41 mg/L (ref 0.000–5.000)

## 2014-06-04 MED ORDER — MOVIPREP 100 G PO SOLR: 1.0000 | Freq: Once | ORAL | Status: DC

## 2014-06-04 NOTE — Patient Instructions (Addendum)
You have been scheduled for a colonoscopy. Please follow written instructions given to you at your visit today.  Please pick up your prep kit at the pharmacy within the next 1-3 days. If you use inhalers (even only as needed), please bring them with you on the day of your procedure. Your physician has requested that you go to [email or web address] and enter the access code given to you at your visit today. This web site gives a general overview about your procedure. However, you should still follow specific instructions given to you by our office regarding your preparation for the procedure.  Your physician has requested that you go to the basement for the following lab work before leaving today: B12, Folate, CBC, CMET, Celiac panel, CRP  We have given you a pneumonia (pneumovax) vaccine today. You may experience a small amount of swelling and redness at the injection site. This is normal. Should you experience these symptoms, please apply ice to the injection area for 10-15 minutes every 2-3 hours. However, should these symptoms or any other symptoms related to the injection concern you, please call our office at [phone].  Please follow up with Dr Hilarie Fredrickson in 1 year.  CC:Dr Marton Redwood

## 2014-06-04 NOTE — Progress Notes (Signed)
Patient ID: Terry Kerr, male   DOB: May 17, 1956, 59 y.o.   MRN: [id number] HPI: Terry Kerr [facility]) is a 59 year old male with past medical history of small bowel Crohn's disease diagnosed in the 1980s who is seen in consultation at the request of Dr. Brigitte Pulse to establish care and consider surveillance colonoscopy. Last colonoscopy was performed in 2010 by Dr. Verl Blalock. This test was to the terminal ileum with excellent prep.  Diverticulosis was seen scattered in the sigmoid, internal hemorrhoids were present the exam was otherwise normal including in the terminal ileum. He reports overall he is feeling well. He has not been treated for Crohn's disease or Crohn's flare in well over a decade. He does report maybe 2 times per year he will have "flares" and this is associated with abdominal bloating and swelling, pain and fatigue. There and this time he reduces his diet both in volume and by reducing fiber intake. Over approximately a week it will slowly get better. He also notices decreased appetite during this time. Last was around November 2015. Today he reports he is feeling well he does report excessive flatulence. Bowel movements occur in the morning though they can occur several times. They're soft but he denies diarrhea. No constipation, bleeding or melena. See his recollection his Crohn's disease has always involved the small bowel. He did take 6-MP for a short time but reports a severe allergy. He denies ocular complaints, oral ulcers, specific joint pains or rash. He had a flu vaccine this year but does not recall previous Pneumovax. Family history of IBD though he feels his sister has IBS  Past Medical History  Diagnosis Date  . Allergic rhinitis     pollen, animal hair, dust mites  . HLD (hyperlipidemia)   . Crohn disease   . Hemorrhoids, internal, thrombosed s/p resection/pexy 12/06/2012 12/04/2012    Past Surgical History  Procedure Laterality Date  . Appendectomy    . Tonsillectomy     . Laminectomy    . Hemorroidectomy      Outpatient Prescriptions Prior to Visit  Medication Sig Dispense Refill  . simvastatin (ZOCOR) 80 MG tablet Take 80 mg by mouth at bedtime.     No facility-administered medications prior to visit.    No Known Allergies  Family History  Problem Relation Age of Onset  . Heart disease Maternal Grandfather   . Heart disease Paternal Grandfather   . Heart disease Father     History  Substance Use Topics  . Smoking status: Never Smoker   . Smokeless tobacco: Not on file  . Alcohol Use: 0.0 oz/week    0 Not specified per week     Comment: glass of wine occasionally     ROS: As per history of present illness, otherwise negative  BP 126/72 mmHg  Pulse 84  Ht 5' 8"  (1.727 m)  Wt 188 lb 6.4 oz (85.458 kg)  BMI 28.65 kg/m2 Constitutional: Well-developed and well-nourished. No distress. HEENT: Normocephalic and atraumatic. Oropharynx is clear and moist. No oropharyngeal exudate. Conjunctivae are normal.  No scleral icterus. Neck: Neck supple. Trachea midline. Cardiovascular: Normal rate, regular rhythm and intact distal pulses. No M/R/G Pulmonary/chest: Effort normal and breath sounds normal. No wheezing, rales or rhonchi. Abdominal: Soft, nontender, nondistended. Bowel sounds active throughout. There are no masses palpable. No hepatosplenomegaly. Extremities: no clubbing, cyanosis, or edema Lymphadenopathy: No cervical adenopathy noted. Neurological: Alert and oriented to person place and time. Skin: Skin is warm and dry. No rashes noted. Psychiatric: Normal  mood and affect. Behavior is normal.  Colonoscopy from 2010 reviewed  ASSESSMENT/PLAN:  59 year old male with past medical history of small bowel Crohn's disease diagnosed in the 1980s who is seen in consultation at the request of Dr. Brigitte Pulse to establish care and consider surveillance colonoscopy  1. Crohn's enteritis -- long-standing small bowel Crohn's disease without evidence of  current disease activity. It is possible he is having minor flares during the periods when he is having abdominal distention and discomfort. It is on clear whether he has a fixed stricture which become slightly inflamed and then symptomatic or if he is having intermittent episodes of inflammation which resolved without treatment. He has never had Crohn's colitis and therefore would not be at risk for colorectal cancer or in need of IBD surveillance colonoscopy. We discussed that should he develop signs of active Crohn's disease which persists we would likely treat with budesonide initially and if becoming more frequent than need to consider imaging and possibly escalation of therapy to include a biologic. I have recommended Pneumovax today and he is agreeable. I will also check B12, folate, CBC, CMP, celiac panel and high sensitivity CRP. He does intermittently use hyoscyamine for abdominal discomfort and he can do this on an as-needed basis.  2. CRC screening -- due at this time per Dr. Sharlett Iles. He is interested in proceeding. We discussed the risks and benefits and will schedule this test.  OV in 1 year, sooner if needed

## 2014-06-05 LAB — TISSUE TRANSGLUTAMINASE, IGA: TISSUE TRANSGLUTAMINASE AB, IGA: 1 U/mL (ref <4)

## 2014-06-09 HISTORY — PX: RETINAL TEAR REPAIR CRYOTHERAPY: SHX5304

## 2014-06-25 ENCOUNTER — Encounter: Payer: Self-pay | Admitting: Internal Medicine

## 2014-06-26 ENCOUNTER — Ambulatory Visit (AMBULATORY_SURGERY_CENTER): Payer: Federal, State, Local not specified - PPO | Admitting: Internal Medicine

## 2014-06-26 ENCOUNTER — Encounter: Payer: Self-pay | Admitting: Internal Medicine

## 2014-06-26 VITALS — BP 130/85 | HR 54 | Temp 96.9°F | Resp 24 | Ht 68.0 in | Wt 188.0 lb

## 2014-06-26 DIAGNOSIS — K635 Polyp of colon: Secondary | ICD-10-CM

## 2014-06-26 DIAGNOSIS — D122 Benign neoplasm of ascending colon: Secondary | ICD-10-CM

## 2014-06-26 DIAGNOSIS — D125 Benign neoplasm of sigmoid colon: Secondary | ICD-10-CM

## 2014-06-26 DIAGNOSIS — K50919 Crohn's disease, unspecified, with unspecified complications: Secondary | ICD-10-CM

## 2014-06-26 DIAGNOSIS — D123 Benign neoplasm of transverse colon: Secondary | ICD-10-CM

## 2014-06-26 DIAGNOSIS — Z1211 Encounter for screening for malignant neoplasm of colon: Secondary | ICD-10-CM

## 2014-06-26 DIAGNOSIS — K639 Disease of intestine, unspecified: Secondary | ICD-10-CM

## 2014-06-26 DIAGNOSIS — K529 Noninfective gastroenteritis and colitis, unspecified: Secondary | ICD-10-CM

## 2014-06-26 MED ORDER — SODIUM CHLORIDE 0.9 % IV SOLN: 500.0000 mL | INTRAVENOUS | Status: DC

## 2014-06-26 NOTE — Op Note (Signed)
[place]  [address] [place], [id number]   COLONOSCOPY PROCEDURE REPORT  PATIENT: Terry Kerr, Terry Kerr  MR#: [id number] BIRTHDATE: 05-10-1956 , 70  yrs. old GENDER: male ENDOSCOPIST: Jerene Bears, MD PROCEDURE DATE:  06/26/2014 PROCEDURE:   Colonoscopy with snare polypectomy and Colonoscopy with biopsy First Screening Colonoscopy - Avg.  risk and is 50 yrs.  old or older - No.  Prior Negative Screening - Now for repeat screening. N/A  History of Adenoma - Now for follow-up colonoscopy & has been > or = to 3 yrs.  N/A  Polyps Removed Today? Yes. ASA CLASS:   Class II INDICATIONS:average risk for colon cancer and history of Crohn's enteritis. MEDICATIONS: Monitored anesthesia care and Propofol 350 mg IV  DESCRIPTION OF PROCEDURE:   After the risks benefits and alternatives of the procedure were thoroughly explained, informed consent was obtained.  The digital rectal exam revealed no rectal mass but palpable scar.   The [id number]  endoscope was introduced through the anus and advanced to the terminal ileum which was intubated for a short distance. No adverse events experienced.   The quality of the prep was good, using MoviPrep The instrument was then slowly withdrawn as the colon was fully examined.   COLON FINDINGS: The examined terminal ileum appeared to be normal. Granular mucosa was found at the cecal base.  Multiple biopsies of the area were performed using cold forceps.   Three sessile polyps ranging from 4 to 49m in size were found in the sigmoid colon, transverse colon, and ascending colon.  Polypectomies were performed with a cold snare.  The resection was complete, the polyp tissue was completely retrieved and sent to histology.   There was mild diverticulosis noted in the sigmoid colon.   A surgical scar was noted in the rectum.  Retroflexion was not performed due to a narrow rectal vault. The time to cecum=4 minutes 11  seconds. Withdrawal time=21 minutes 25 seconds.  The scope was withdrawn and the procedure completed. COMPLICATIONS: There were no immediate complications.  ENDOSCOPIC IMPRESSION: 1.   The examined terminal ileum appeared to be normal 2.   Abnormal mucosa was found at the cecum; The mucosa had granularity; multiple biopsies of the area were performed using cold forceps 3.   Three sessile polyps ranging from 4 to 771min size were found in the sigmoid colon, transverse colon, and ascending colon; polypectomies were performed with a cold snare 4.   Mild diverticulosis was noted in the sigmoid colon 5.   Surgical scar in the rectum likely from prior hemorrhoidectomy  RECOMMENDATIONS: 1.  Await pathology results 2.  Timing of repeat colonoscopy will be determined by pathology findings. 3.  You will receive a letter within 1-2 weeks with the results of your biopsy as well as final recommendations.  Please call my office if you have not received a letter after 3 weeks.  eSigned:  JaJerene BearsMD 06/26/2014 9:56 AM   cc: W.Janalyn RouseMD and The Patient   PATIENT NAME:  PaPraneel, HaisleyR#: [id number]

## 2014-06-26 NOTE — Patient Instructions (Addendum)
YOU HAD AN ENDOSCOPIC PROCEDURE TODAY AT [facility]: Refer to the procedure report that was given to you for any specific questions about what was found during the examination.  If the procedure report does not answer your questions, please call your gastroenterologist to clarify.  If you requested that your care partner not be given the details of your procedure findings, then the procedure report has been included in a sealed envelope for you to review at your convenience later.  YOU SHOULD EXPECT: Some feelings of bloating in the abdomen. Passage of more gas than usual.  Walking can help get rid of the air that was put into your GI tract during the procedure and reduce the bloating. If you had a lower endoscopy (such as a colonoscopy or flexible sigmoidoscopy) you may notice spotting of blood in your stool or on the toilet paper. If you underwent a bowel prep for your procedure, then you may not have a normal bowel movement for a few days.  DIET: Your first meal following the procedure should be a light meal and then it is ok to progress to your normal diet.  A half-sandwich or bowl of soup is an example of a good first meal.  Heavy or fried foods are harder to digest and may make you feel nauseous or bloated.  Likewise meals heavy in dairy and vegetables can cause extra gas to form and this can also increase the bloating.  Drink plenty of fluids but you should avoid alcoholic beverages for 24 hours.  ACTIVITY: Your care partner should take you home directly after the procedure.  You should plan to take it easy, moving slowly for the rest of the day.  You can resume normal activity the day after the procedure however you should NOT DRIVE or use heavy machinery for 24 hours (because of the sedation medicines used during the test).    SYMPTOMS TO REPORT IMMEDIATELY: A gastroenterologist can be reached at any hour.  During normal business hours, 8:30 AM to 5:00 PM Monday through Friday,  call [phone].  After hours and on weekends, please call the GI answering service at [phone] who will take a message and have the physician on call contact you.   Following lower endoscopy (colonoscopy or flexible sigmoidoscopy):  Excessive amounts of blood in the stool  Significant tenderness or worsening of abdominal pains  Swelling of the abdomen that is new, acute  Fever of 100F or higher  FOLLOW UP: If any biopsies were taken you will be contacted by phone or by letter within the next 1-3 weeks.  Call your gastroenterologist if you have not heard about the biopsies in 3 weeks.  Our staff will call the home number listed on your records the next business day following your procedure to check on you and address any questions or concerns that you may have at that time regarding the information given to you following your procedure. This is a courtesy call and so if there is no answer at the home number and we have not heard from you through the emergency physician on call, we will assume that you have returned to your regular daily activities without incident.  SIGNATURES/CONFIDENTIALITY: You and/or your care partner have signed paperwork which will be entered into your electronic medical record.  These signatures attest to the fact that that the information above on your After Visit Summary has been reviewed and is understood.  Full responsibility of the confidentiality of this  discharge information lies with you and/or your care-partner.  Discharge instructions provided to patient and/or care partner.  Polyp and diverticulosis handouts provided.

## 2014-06-26 NOTE — Progress Notes (Signed)
Called to room to assist during endoscopic procedure.  Patient ID and intended procedure confirmed with present staff. Received instructions for my participation in the procedure from the performing physician.  

## 2014-06-26 NOTE — Progress Notes (Signed)
A/ox3, pleased with MAC, report to RN 

## 2014-06-29 ENCOUNTER — Telehealth: Payer: Self-pay

## 2014-06-29 NOTE — Telephone Encounter (Signed)
  Follow up Call-  Call back number 06/26/2014  Post procedure Call Back phone  # [phone]  Permission to leave phone message Yes     Patient questions:  Do you have a fever, pain , or abdominal swelling? No. Pain Score  0 *  Have you tolerated food without any problems? Yes.    Have you been able to return to your normal activities? Yes.    Do you have any questions about your discharge instructions: Diet   No. Medications  No. Follow up visit  No.  Do you have questions or concerns about your Care? No.  Actions: * If pain score is 4 or above: No action needed, pain <4.

## 2014-07-02 ENCOUNTER — Encounter: Payer: Self-pay | Admitting: Internal Medicine

## 2014-07-14 ENCOUNTER — Encounter: Payer: Self-pay | Admitting: *Deleted

## 2014-07-24 ENCOUNTER — Ambulatory Visit (INDEPENDENT_AMBULATORY_CARE_PROVIDER_SITE_OTHER): Payer: Federal, State, Local not specified - PPO | Admitting: Internal Medicine

## 2014-07-24 ENCOUNTER — Encounter: Payer: Self-pay | Admitting: Internal Medicine

## 2014-07-24 VITALS — BP 134/80 | HR 64 | Ht 68.0 in | Wt 185.5 lb

## 2014-07-24 DIAGNOSIS — E538 Deficiency of other specified B group vitamins: Secondary | ICD-10-CM

## 2014-07-24 DIAGNOSIS — K50819 Crohn's disease of both small and large intestine with unspecified complications: Secondary | ICD-10-CM

## 2014-07-24 NOTE — Patient Instructions (Signed)
You have been scheduled for a capsule endoscopy on 08/14/14 @ 8 am. Please follow written instructions given to you at your appointment today.

## 2014-07-24 NOTE — Progress Notes (Signed)
Subjective:    Patient ID: Terry Kerr, male    DOB: 1956-04-01, 59 y.o.   MRN: [id number]  HPI Terry Kerr is a 59 yo male with PMH of small bowel and now know to be colonic as well Crohn's disease (dx 81s) who is seen in follow-up. He was initially seen on 06/04/2014 and came for colonoscopy on 06/26/2014. Colonoscopy revealed a normal terminal ileum in the examined segments. There was granular mucosa at the cecal base which was biopsied. There were 3 sessile polyps removed from the ascending, transverse and sigmoid colon. The remainder of the colonic mucosa was unremarkable without definite active colitis. There was mild diverticulosis in the sigmoid colon and a surgical scar from prior hemorrhoidectomy in the distal rectum. Pathology revealed mildly active chronic colitis in the cecum and the polyps were fragments of pseudopolyps without dysplasia or malignancy.  He today feels well. He denies abdominal pain. No diarrhea or constipation. Bowel movements have been unchanged recently. No fevers or chills. Eating well with no nausea or vomiting. No trouble swallowing or significant heartburn. No new joint pains or eye complaint. No rashes.  He does have "flares" approximately 2 times per year when he will have abdominal bloating, pain and fatigue. Last occurred in November 2015. During this time he will reduce his diet and eat liquids and avoid high-fiber foods. Episodes can last a week that the last episode seemed a last "a bit longer". No history of true bowel obstruction.  Review of Systems As per history of present illness, otherwise negative  Current Medications, Allergies, Past Medical History, Past Surgical History, Family History and Social History were reviewed in [employer] record.     Objective:   Physical Exam BP 134/80 mmHg  Pulse 64  Ht 5' 8" (1.727 m)  Wt 185 lb 8 oz (84.142 kg)  BMI 28.21 kg/m2 Constitutional: Well-developed and well-nourished. No  distress. HEENT: Normocephalic and atraumatic. Oropharynx is clear and moist. No oropharyngeal exudate. Conjunctivae are normal.  No scleral icterus. Neck: Neck supple. Trachea midline. Cardiovascular: Normal rate, regular rhythm and intact distal pulses. No M/R/G Pulmonary/chest: Effort normal and breath sounds normal. No wheezing, rales or rhonchi. Abdominal: Soft, nontender, nondistended. Bowel sounds active throughout. There are no masses palpable. No hepatosplenomegaly. Extremities: no clubbing, cyanosis, or edema Lymphadenopathy: No cervical adenopathy noted. Neurological: Alert and oriented to person place and time. Skin: Skin is warm and dry. No rashes noted. Psychiatric: Normal mood and affect. Behavior is normal.  Colonoscopy including pathology results reviewed extensively with the patient today  hsCRP 0.410 CBC    Component Value Date/Time   WBC 7.1 06/04/2014 1157   RBC 5.31 06/04/2014 1157   HGB 15.8 06/04/2014 1157   HCT 47.0 06/04/2014 1157   PLT 257.0 06/04/2014 1157   MCV 88.6 06/04/2014 1157   MCH 31.2 02/23/2010 1139   MCHC 33.7 06/04/2014 1157   RDW 13.2 06/04/2014 1157   LYMPHSABS 1.3 06/04/2014 1157   MONOABS 0.6 06/04/2014 1157   EOSABS 0.1 06/04/2014 1157   BASOSABS 0.0 06/04/2014 1157    CMP     Component Value Date/Time   NA 140 06/04/2014 1157   K 4.7 06/04/2014 1157   CL 106 06/04/2014 1157   CO2 28 06/04/2014 1157   GLUCOSE 105* 06/04/2014 1157   BUN 16 06/04/2014 1157   CREATININE 1.0 06/04/2014 1157   CALCIUM 9.2 06/04/2014 1157   PROT 7.0 06/04/2014 1157   ALBUMIN 4.0 06/04/2014 1157  AST 18 06/04/2014 1157   ALT 29 06/04/2014 1157   ALKPHOS 73 06/04/2014 1157   BILITOT 0.8 06/04/2014 1157   GFRNONAA 77.18 04/26/2010 0925   GFRAA  02/23/2010 1139    >60        The eGFR has been calculated using the MDRD equation. This calculation has not been validated in all clinical situations. eGFR's persistently <60 mL/min  signify possible Chronic Kidney Disease.    Lab Results  Component Value Date   VITAMINB12 228 06/04/2014   Lab Results  Component Value Date   FOLATE >24.6 06/04/2014   Celiac panel negative      Assessment & Plan:  59 yo male with PMH of small bowel and now know to be colonic as well Crohn's disease (dx 88s) who is seen in follow-up.  1. Ileocolonic Crohn's -- he has mild chronic active colitis in the cecum currently as documented on colonoscopy in late January 2016. He is in clinical remission. He does have episodes of what sounds like active small bowel Crohn's disease and possibly very low-grade partial obstruction occurring several times per year. I do not currently think that the very mild activity in the cecum necessarily warrants escalation of therapy. We discussed options including 5-ASA medications and also Biologics. He was previously intolerant to 6-MP.  We discussed how 5-ASA medications are often not great options for Crohn's disease as compared to their often great benefit in ulcerative colitis. I have recommended an objective test to evaluate Crohn's activity in the small bowel. We discussed MR enterography versus video capsule study. We decided on video capsule study. This will be scheduled. Should moderate or severe disease be found in the small bowel recommend escalation of therapy to Biologics. He is asked to notify me should he develop a flare of his disease. He voices understanding  2. B12 deficiency -- borderline B12 when checked in January. He is taking 1000 g daily

## 2014-08-14 ENCOUNTER — Ambulatory Visit (INDEPENDENT_AMBULATORY_CARE_PROVIDER_SITE_OTHER): Payer: Federal, State, Local not specified - PPO | Admitting: Internal Medicine

## 2014-08-14 DIAGNOSIS — K5080 Crohn's disease of both small and large intestine without complications: Secondary | ICD-10-CM

## 2014-08-14 NOTE — Progress Notes (Signed)
Patient here for capsule endoscopy. Tolerated procedure. Verbalizes understanding of written and verbal instructions. Capsule ID#[id number]  Lot #[id number]  [id number]

## 2014-09-03 ENCOUNTER — Encounter: Payer: Self-pay | Admitting: Internal Medicine

## 2014-09-03 ENCOUNTER — Ambulatory Visit (INDEPENDENT_AMBULATORY_CARE_PROVIDER_SITE_OTHER)
Admission: RE | Admit: 2014-09-03 | Discharge: 2014-09-03 | Disposition: A | Discharge status: Home / Self Care | Payer: Federal, State, Local not specified - PPO | Source: Ambulatory Visit | Attending: Internal Medicine | Admitting: Internal Medicine

## 2014-09-03 ENCOUNTER — Ambulatory Visit (INDEPENDENT_AMBULATORY_CARE_PROVIDER_SITE_OTHER): Payer: Federal, State, Local not specified - PPO | Admitting: Internal Medicine

## 2014-09-03 VITALS — BP 124/62 | HR 64 | Ht 67.75 in | Wt 186.4 lb

## 2014-09-03 DIAGNOSIS — K5090 Crohn's disease, unspecified, without complications: Secondary | ICD-10-CM | POA: Diagnosis not present

## 2014-09-03 MED ORDER — BUDESONIDE 3 MG PO CP24: 9.0000 mg | ORAL_CAPSULE | Freq: Every day | ORAL | Status: DC

## 2014-09-03 NOTE — Patient Instructions (Signed)
We have sent the following medications to your pharmacy for you to pick up at your convenience: Entocort 9 mg daily x 8 weeks  Discontinue Florastor (or take only as needed)  Your provider has requested that you have an abdominal x ray before leaving today. Please go to the basement floor to our Radiology department for the test.  Please follow up with Dr Hilarie Fredrickson on 11/02/14 @ 1:45 pm.

## 2014-09-03 NOTE — Progress Notes (Signed)
Subjective:    Patient ID: Terry Kerr, male    DOB: 10-20-1955, 59 y.o.   MRN: [id number]  HPI Terry Kerr is a 59 year old male with PMH of Crohn's (primarily small bowel, with some right colon involvement) diagnosed in 1980 who is seen for follow-up.  He is here alone today after VCE.  He had a colonoscopy in January 2016 which showed normal terminal ileum in the examined segments, granular mucosa at the cecal base which was mild chronic active colitis and 3 polyps removed found to be pseudopolyps. Video capsule endoscopy performed very recently revealed mild to moderate ulcerative changes with inflammation and mild luminal narrowing in the mid to distal small bowel. The capsule did not reach the cecum and the 8 hour study period.  He returns today to discuss management  He has noticed just feeling slightly unwell recently. This has been somewhat vague but involves abdominal discomfort left greater than right abdomen. He has a history of flares throughout the year 2-3 times with abdominal bloating, pain and fatigue. During that time he reduces his diet and symptoms last a week but perhaps longer. No blood in his stool or melena. Bowel move was a been fairly regular. No oral ulcers. No new rashes or joint pains or eye complaints.  Review of Systems As per HPI, otherwise negative  Current Medications, Allergies, Past Medical History, Past Surgical History, Family History and Social History were reviewed in [employer] record.     Objective:   Physical Exam BP 124/62 mmHg  Pulse 64  Ht 5' 7.75" (1.721 m)  Wt 186 lb 6 oz (84.539 kg)  BMI 28.54 kg/m2 Constitutional: Well-developed and well-nourished. No distress. HEENT: Normocephalic and atraumatic. Oropharynx is clear and moist. No oropharyngeal exudate. Conjunctivae are normal.  No scleral icterus. Neck: Neck supple. Trachea midline. Cardiovascular: Normal rate, regular rhythm and intact distal  pulses. Pulmonary/chest: Effort normal and breath sounds normal. No wheezing, rales or rhonchi. Abdominal: Soft, tender in the left mid abdomen without rebound or guarding, nondistended. Bowel sounds active throughout. There are no masses palpable. No hepatosplenomegaly. Extremities: no clubbing, cyanosis, or edema Neurological: Alert and oriented to person place and time. Psychiatric: Normal mood and affect. Behavior is normal.  CBC    Component Value Date/Time   WBC 7.1 06/04/2014 1157   RBC 5.31 06/04/2014 1157   HGB 15.8 06/04/2014 1157   HCT 47.0 06/04/2014 1157   PLT 257.0 06/04/2014 1157   MCV 88.6 06/04/2014 1157   MCH 31.2 02/23/2010 1139   MCHC 33.7 06/04/2014 1157   RDW 13.2 06/04/2014 1157   LYMPHSABS 1.3 06/04/2014 1157   MONOABS 0.6 06/04/2014 1157   EOSABS 0.1 06/04/2014 1157   BASOSABS 0.0 06/04/2014 1157    CMP     Component Value Date/Time   NA 140 06/04/2014 1157   K 4.7 06/04/2014 1157   CL 106 06/04/2014 1157   CO2 28 06/04/2014 1157   GLUCOSE 105* 06/04/2014 1157   BUN 16 06/04/2014 1157   CREATININE 1.0 06/04/2014 1157   CALCIUM 9.2 06/04/2014 1157   PROT 7.0 06/04/2014 1157   ALBUMIN 4.0 06/04/2014 1157   AST 18 06/04/2014 1157   ALT 29 06/04/2014 1157   ALKPHOS 73 06/04/2014 1157   BILITOT 0.8 06/04/2014 1157   GFRNONAA 77.18 04/26/2010 0925   GFRAA  02/23/2010 1139    >60        The eGFR has been calculated using the MDRD equation. This calculation has  not been validated in all clinical situations. eGFR's persistently <60 mL/min signify possible Chronic Kidney Disease.    Celiac panel neg hsCRP normal       Assessment & Plan:  59 year old male with PMH of Crohn's (primarily small bowel, with some right colon involvement) diagnosed in 1980 who is seen for follow-up.  He is here alone today after VCE.  1. Crohn's enteritis/cecitis -- majority of his symptoms are small bowel related and I do think he has signs and symptoms of  active disease despite the fact that he is living with this for many years. We have very long discussion today regarding the video capsule endoscopy results, his current symptoms and management. He is previous he been intolerant to 6-MP. I discussed how mesalamine and 5-ASA type medicines do not work well for Crohn's disease, particularly in the small bowel. We discussed escalation of therapy to include Biologics and discussed both tumor necrosis factor medications and the new entyvio.  We discussed the risks of biologic therapy which includes infection, malignancy, demyelinating disease, heart failure, rash. I do think without therapy he is at risk for worsening of his small bowel Crohn's, obstruction, fatigue. He is open the biologic therapy. After this discussion we decided to prescribe Entocort 9 mg 8 weeks. Return in 8 weeks to assess response. If great response then we will wait to see how long he gets benefit. if this is a very short time we'll plan to escalate the biologic therapy, if response lasts for a great length of time than he may be able to get by with periodic use of budesonide. Follow-up in 8 weeks. X-ray today to confirm passage of video capsule  Greater than 40 minutes spent today with 35 minutes being direct counseling regarding the above issues

## 2014-09-10 ENCOUNTER — Encounter: Payer: Self-pay | Admitting: Internal Medicine

## 2014-09-16 ENCOUNTER — Telehealth: Payer: Self-pay

## 2014-09-16 ENCOUNTER — Other Ambulatory Visit: Payer: Self-pay

## 2014-09-16 DIAGNOSIS — T189XXD Foreign body of alimentary tract, part unspecified, subsequent encounter: Secondary | ICD-10-CM

## 2014-09-16 NOTE — Telephone Encounter (Signed)
-----   Message from Algernon Huxley, RN sent at 09/04/2014 10:35 AM EDT ----- Regarding: xray Pt needs KUB for foreign body-VCE

## 2014-09-16 NOTE — Telephone Encounter (Signed)
Pt aware and is coming for xray.

## 2014-09-18 ENCOUNTER — Ambulatory Visit (INDEPENDENT_AMBULATORY_CARE_PROVIDER_SITE_OTHER)
Admission: RE | Admit: 2014-09-18 | Discharge: 2014-09-18 | Disposition: A | Discharge status: Home / Self Care | Payer: Federal, State, Local not specified - PPO | Source: Ambulatory Visit | Attending: Internal Medicine | Admitting: Internal Medicine

## 2014-09-18 DIAGNOSIS — T189XXD Foreign body of alimentary tract, part unspecified, subsequent encounter: Secondary | ICD-10-CM

## 2014-09-29 ENCOUNTER — Inpatient Hospital Stay (HOSPITAL_COMMUNITY): Payer: Federal, State, Local not specified - PPO

## 2014-09-29 ENCOUNTER — Telehealth: Payer: Self-pay | Admitting: Gastroenterology

## 2014-09-29 ENCOUNTER — Inpatient Hospital Stay (HOSPITAL_COMMUNITY)
Admission: EM | Admit: 2014-09-29 | Discharge: 2014-10-05 | DRG: 330 | Disposition: A | Discharge status: Home / Self Care | Payer: Federal, State, Local not specified - PPO | Attending: Internal Medicine | Admitting: Internal Medicine

## 2014-09-29 ENCOUNTER — Emergency Department (HOSPITAL_COMMUNITY): Payer: Federal, State, Local not specified - PPO

## 2014-09-29 ENCOUNTER — Encounter (HOSPITAL_COMMUNITY): Payer: Self-pay | Admitting: Emergency Medicine

## 2014-09-29 DIAGNOSIS — K5790 Diverticulosis of intestine, part unspecified, without perforation or abscess without bleeding: Secondary | ICD-10-CM | POA: Diagnosis present

## 2014-09-29 DIAGNOSIS — Z79899 Other long term (current) drug therapy: Secondary | ICD-10-CM

## 2014-09-29 DIAGNOSIS — E785 Hyperlipidemia, unspecified: Secondary | ICD-10-CM | POA: Diagnosis present

## 2014-09-29 DIAGNOSIS — R1031 Right lower quadrant pain: Secondary | ICD-10-CM | POA: Diagnosis not present

## 2014-09-29 DIAGNOSIS — R109 Unspecified abdominal pain: Secondary | ICD-10-CM

## 2014-09-29 DIAGNOSIS — E7800 Pure hypercholesterolemia, unspecified: Secondary | ICD-10-CM | POA: Diagnosis present

## 2014-09-29 DIAGNOSIS — K5090 Crohn's disease, unspecified, without complications: Secondary | ICD-10-CM | POA: Diagnosis present

## 2014-09-29 DIAGNOSIS — K5669 Other intestinal obstruction: Secondary | ICD-10-CM | POA: Diagnosis not present

## 2014-09-29 DIAGNOSIS — Z86010 Personal history of colonic polyps: Secondary | ICD-10-CM

## 2014-09-29 DIAGNOSIS — K56609 Unspecified intestinal obstruction, unspecified as to partial versus complete obstruction: Secondary | ICD-10-CM | POA: Diagnosis present

## 2014-09-29 DIAGNOSIS — T184XXA Foreign body in colon, initial encounter: Secondary | ICD-10-CM | POA: Diagnosis present

## 2014-09-29 DIAGNOSIS — X58XXXA Exposure to other specified factors, initial encounter: Secondary | ICD-10-CM | POA: Diagnosis present

## 2014-09-29 DIAGNOSIS — K5730 Diverticulosis of large intestine without perforation or abscess without bleeding: Secondary | ICD-10-CM | POA: Diagnosis present

## 2014-09-29 DIAGNOSIS — K50912 Crohn's disease, unspecified, with intestinal obstruction: Secondary | ICD-10-CM | POA: Diagnosis not present

## 2014-09-29 DIAGNOSIS — Z8249 Family history of ischemic heart disease and other diseases of the circulatory system: Secondary | ICD-10-CM | POA: Diagnosis not present

## 2014-09-29 DIAGNOSIS — K5000 Crohn's disease of small intestine without complications: Secondary | ICD-10-CM | POA: Diagnosis present

## 2014-09-29 DIAGNOSIS — Z9049 Acquired absence of other specified parts of digestive tract: Secondary | ICD-10-CM | POA: Diagnosis present

## 2014-09-29 DIAGNOSIS — Z9889 Other specified postprocedural states: Secondary | ICD-10-CM | POA: Diagnosis not present

## 2014-09-29 DIAGNOSIS — R1010 Upper abdominal pain, unspecified: Secondary | ICD-10-CM

## 2014-09-29 HISTORY — DX: Crohn's disease, unspecified, without complications: K50.90

## 2014-09-29 LAB — MAGNESIUM: Magnesium: 2.1 mg/dL (ref 1.7–2.4)

## 2014-09-29 LAB — COMPREHENSIVE METABOLIC PANEL
ALBUMIN: 4.1 g/dL (ref 3.5–5.0)
ALT: 18 U/L (ref 17–63)
ANION GAP: 5 (ref 5–15)
AST: 14 U/L — AB (ref 15–41)
Alkaline Phosphatase: 65 U/L (ref 38–126)
BILIRUBIN TOTAL: 1.6 mg/dL — AB (ref 0.3–1.2)
BUN: 17 mg/dL (ref 6–20)
CHLORIDE: 105 mmol/L (ref 101–111)
CO2: 27 mmol/L (ref 22–32)
CREATININE: 0.97 mg/dL (ref 0.61–1.24)
Calcium: 9.1 mg/dL (ref 8.9–10.3)
GFR calc Af Amer: >60 mL/min (ref >60)
GFR calc non Af Amer: >60 mL/min (ref >60)
Glucose, Bld: 113 mg/dL — ABNORMAL HIGH (ref 70–99)
Potassium: 4.1 mmol/L (ref 3.5–5.1)
Sodium: 137 mmol/L (ref 135–145)
TOTAL PROTEIN: 7.2 g/dL (ref 6.5–8.1)

## 2014-09-29 LAB — URINALYSIS, ROUTINE W REFLEX MICROSCOPIC
Bilirubin Urine: NEGATIVE
GLUCOSE, UA: NEGATIVE mg/dL
Hgb urine dipstick: NEGATIVE
Ketones, ur: NEGATIVE mg/dL
Leukocytes, UA: NEGATIVE
Nitrite: NEGATIVE
Protein, ur: NEGATIVE mg/dL
SPECIFIC GRAVITY, URINE: 1.009 (ref 1.005–1.030)
UROBILINOGEN UA: 0.2 mg/dL (ref 0.0–1.0)
pH: 6.5 (ref 5.0–8.0)

## 2014-09-29 LAB — CBC WITH DIFFERENTIAL/PLATELET
Basophils Absolute: 0 10*3/uL (ref 0.0–0.1)
Basophils Relative: 0 % (ref 0–1)
Eosinophils Absolute: 0 10*3/uL (ref 0.0–0.7)
Eosinophils Relative: 0 % (ref 0–5)
HEMATOCRIT: 49.1 % (ref 39.0–52.0)
HEMOGLOBIN: 16.5 g/dL (ref 13.0–17.0)
LYMPHS PCT: 5 % — AB (ref 12–46)
Lymphs Abs: 0.5 10*3/uL — ABNORMAL LOW (ref 0.7–4.0)
MCH: 30.4 pg (ref 26.0–34.0)
MCHC: 33.6 g/dL (ref 30.0–36.0)
MCV: 90.6 fL (ref 78.0–100.0)
MONO ABS: 0.8 10*3/uL (ref 0.1–1.0)
MONOS PCT: 9 % (ref 3–12)
Neutro Abs: 7.7 10*3/uL (ref 1.7–7.7)
Neutrophils Relative %: 86 % — ABNORMAL HIGH (ref 43–77)
Platelets: 208 10*3/uL (ref 150–400)
RBC: 5.42 MIL/uL (ref 4.22–5.81)
RDW: 13.3 % (ref 11.5–15.5)
WBC: 9 10*3/uL (ref 4.0–10.5)

## 2014-09-29 LAB — HEPATITIS B SURFACE ANTIGEN: Hepatitis B Surface Ag: NEGATIVE

## 2014-09-29 LAB — I-STAT CG4 LACTIC ACID, ED: LACTIC ACID, VENOUS: 0.92 mmol/L (ref 0.5–2.0)

## 2014-09-29 LAB — LIPASE, BLOOD: Lipase: 23 U/L (ref 22–51)

## 2014-09-29 LAB — HEPATITIS B SURFACE ANTIBODY,QUALITATIVE: HEP B S AB: NEGATIVE

## 2014-09-29 LAB — HEPATITIS C ANTIBODY: HCV Ab: NEGATIVE

## 2014-09-29 MED ORDER — IOHEXOL 300 MG/ML  SOLN: 100.0000 mL | Freq: Once | INTRAMUSCULAR | Status: AC | PRN

## 2014-09-29 MED ORDER — LORAZEPAM 2 MG/ML IJ SOLN
1.0000 mg | Freq: Once | INTRAMUSCULAR | Status: AC
  Administered 2014-09-29: 1 mg via INTRAVENOUS
  Filled 2014-09-29: qty 1

## 2014-09-29 MED ORDER — HYDROMORPHONE HCL 1 MG/ML IJ SOLN
1.0000 mg | Freq: Once | INTRAMUSCULAR | Status: AC
  Administered 2014-09-29: 1 mg via INTRAVENOUS
  Filled 2014-09-29: qty 1

## 2014-09-29 MED ORDER — ONDANSETRON HCL 4 MG/2ML IJ SOLN
4.0000 mg | Freq: Once | INTRAMUSCULAR | Status: AC
  Administered 2014-09-29: 4 mg via INTRAVENOUS
  Filled 2014-09-29: qty 2

## 2014-09-29 MED ORDER — IOHEXOL 300 MG/ML  SOLN
50.0000 mL | Freq: Once | INTRAMUSCULAR | Status: AC | PRN
  Administered 2014-09-29: 50 mL via ORAL

## 2014-09-29 MED ORDER — ONDANSETRON HCL 4 MG/2ML IJ SOLN: 4.0000 mg | Freq: Four times a day (QID) | INTRAMUSCULAR | Status: DC | PRN

## 2014-09-29 MED ORDER — ENOXAPARIN SODIUM 40 MG/0.4ML ~~LOC~~ SOLN
40.0000 mg | SUBCUTANEOUS | Status: DC
  Administered 2014-09-29 – 2014-10-04 (×6): 40 mg via SUBCUTANEOUS
  Filled 2014-09-29 (×7): qty 0.4

## 2014-09-29 MED ORDER — METHYLPREDNISOLONE SODIUM SUCC 125 MG IJ SOLR
125.0000 mg | Freq: Once | INTRAMUSCULAR | Status: AC
  Administered 2014-09-29: 125 mg via INTRAVENOUS
  Filled 2014-09-29: qty 2

## 2014-09-29 MED ORDER — ACETAMINOPHEN 650 MG RE SUPP: 650.0000 mg | Freq: Four times a day (QID) | RECTAL | Status: DC | PRN

## 2014-09-29 MED ORDER — LEVALBUTEROL HCL 0.63 MG/3ML IN NEBU: 0.6300 mg | INHALATION_SOLUTION | Freq: Four times a day (QID) | RESPIRATORY_TRACT | Status: DC | PRN

## 2014-09-29 MED ORDER — SODIUM CHLORIDE 0.9 % IV SOLN
INTRAVENOUS | Status: DC
  Administered 2014-09-29: 1000 mL via INTRAVENOUS
  Administered 2014-09-29: 22:00:00 via INTRAVENOUS
  Administered 2014-09-30 (×2): 1000 mL via INTRAVENOUS
  Administered 2014-10-01 – 2014-10-02 (×3): via INTRAVENOUS
  Administered 2014-10-02: 1000 mL via INTRAVENOUS
  Administered 2014-10-03 (×2): via INTRAVENOUS

## 2014-09-29 MED ORDER — CETYLPYRIDINIUM CHLORIDE 0.05 % MT LIQD
7.0000 mL | Freq: Two times a day (BID) | OROMUCOSAL | Status: DC
  Administered 2014-09-29 – 2014-10-05 (×11): 7 mL via OROMUCOSAL

## 2014-09-29 MED ORDER — HYDROMORPHONE HCL 1 MG/ML IJ SOLN: 1.0000 mg | INTRAMUSCULAR | Status: DC | PRN

## 2014-09-29 MED ORDER — ACETAMINOPHEN 325 MG PO TABS
650.0000 mg | ORAL_TABLET | Freq: Four times a day (QID) | ORAL | Status: DC | PRN
  Administered 2014-09-29: 650 mg via ORAL
  Filled 2014-09-29: qty 2

## 2014-09-29 MED ORDER — SODIUM CHLORIDE 0.9 % IV SOLN
Freq: Once | INTRAVENOUS | Status: AC
  Administered 2014-09-29: 08:00:00 via INTRAVENOUS

## 2014-09-29 MED ORDER — ONDANSETRON HCL 4 MG PO TABS: 4.0000 mg | ORAL_TABLET | Freq: Four times a day (QID) | ORAL | Status: DC | PRN

## 2014-09-29 NOTE — ED Notes (Signed)
Pt with Hx of Crohn's c/o abdominal pain flare-up onset yesterday with nausea, bloating. Pt states he had endoscopy 6 six weeks ago and capsule has not been expelled.

## 2014-09-29 NOTE — Consult Note (Signed)
Referring Provider: [facility], Dr. Betsey Holiday Primary Care Physician:  Marton Redwood, MD Primary Gastroenterologist:  Dr.Pyrtle  Reason for Consultation:   Crohns, SBO    HPI: Terry Kerr is a 59 y.o. male with past medical history of small bowel Crohn's disease which was diagnosed in the 59s. He was recently evaluated by Dr. Hilarie Fredrickson in January 2016 to establish care. His last colonoscopy at that time had been in 2010 by Dr. Sharlett Iles. The test was to the terminal ileum and he was noted to have diverticulosis in the sigmoid, internal hemorrhoids and an otherwise normal exam including the terminal ileum. He had not been treated for his Crohn's disease or Crohn's flare in over a decade. He reported that 2 or 3 times a year he would have "flares" associated with abdominal bloating, swelling, pain and fatigue. During these periods, able to reduce his diet symptoms would resolve in about a week. He reports that he had been on 6-MP in the past for a short time but it was discontinued due to an allergic reaction. 06/26/2014 he had a colonoscopy which noted granular mucosa at the cecal base. He also had 3 sessile polyps removed from the ascending, transverse, and sigmoid colon. The remainder of the colonic mucosa was unremarkable without definite active colitis. He was noted to have mild diverticulosis in the sigmoid. Pathology revealed mildly active chronic colitis in the cecum and the polyps were fragments of pseudopolyps without dysplasia or malignancy. Escalation of therapy to Biologics was discussed with the patient, and MR enterography versus video capsule study was discussed with the patient for evaluation of Crohn's activity in the small bowel. The decision was made to proceed with video capsule study. Capsule endoscopy was performed on 09/10/2014 and revealed mild active Crohn's enteritis with ulcerations, erosions, and edema, and Biologics were recommended. Patient was seen in the office on April 7 which  time he reported that he was feeling unwell with the symptoms of abdominal discomfort. He had no nausea or vomiting. Escalation of therapy to include Biologics was a can carry to an antitumor necrosis factor medications as well as into the oh were reviewed with patient's it was explained to patient that without therapy likely be at risk for worsening small bowel Crohn's and obstruction. He was prescribed Entocort 9 mg daily for 8 weeks and was to return in 8 weeks to assess response. Yesterday the patient had roasted beets and sweet potatoes for lunch and shortly thereafter began to experience abdominal distention and pain. His last bowel movement was yesterday. He vomited last evening. Patient called the GI office and was advised to go to the [facility] long emergency room for evaluation. CT performed today showed small bowel obstruction with distended small bowel loops and air-fluid levels in the mid lower abdomen and pelvis the transition point was at the level of the ingested capsule. Small bowel distal to the capsule was small in caliber.   Past Medical History  Diagnosis Date  . Allergic rhinitis     pollen, animal hair, dust mites  . HLD (hyperlipidemia)   . Crohn disease   . Hemorrhoids, internal, thrombosed s/p resection/pexy 12/06/2012 12/04/2012  . Diverticulosis     Past Surgical History  Procedure Laterality Date  . Appendectomy    . Tonsillectomy    . Laminectomy    . Hemorroidectomy    . Retinal tear repair cryotherapy Right 06/09/14    repaired with laser    Prior to Admission medications   Medication Sig  Start Date End Date Taking? Authorizing Provider  atorvastatin (LIPITOR) 40 MG tablet Take 40 mg by mouth daily.   Yes Historical Provider, MD  budesonide (ENTOCORT EC) 3 MG 24 hr capsule Take 3 capsules (9 mg total) by mouth daily. 09/03/14  Yes Jerene Bears, MD  Cholecalciferol (VITAMIN D) 2000 UNITS tablet Take 2,000 Units by mouth daily.   Yes Historical Provider, MD    fexofenadine (ALLEGRA) 30 MG tablet Take 30 mg by mouth daily as needed (for allergies).    Yes Historical Provider, MD  hyoscyamine (LEVSIN SL) 0.125 MG SL tablet Place 0.125 mg under the tongue 3 (three) times daily as needed for cramping.   Yes Historical Provider, MD    Current Facility-Administered Medications  Medication Dose Route Frequency Provider Last Rate Last Dose  . 0.9 %  sodium chloride infusion   Intravenous Continuous Reyne Dumas, MD      . acetaminophen (TYLENOL) tablet 650 mg  650 mg Oral Q6H PRN Reyne Dumas, MD       Or  . acetaminophen (TYLENOL) suppository 650 mg  650 mg Rectal Q6H PRN Reyne Dumas, MD      . enoxaparin (LOVENOX) injection 40 mg  40 mg Subcutaneous Q24H Nayana Abrol, MD      . HYDROmorphone (DILAUDID) injection 1 mg  1 mg Intravenous Q4H PRN Reyne Dumas, MD      . iohexol (OMNIPAQUE) 300 MG/ML solution 100 mL  100 mL Intravenous Once PRN Medication Radiologist, MD      . levalbuterol (XOPENEX) nebulizer solution 0.63 mg  0.63 mg Nebulization Q6H PRN Reyne Dumas, MD      . ondansetron (ZOFRAN) tablet 4 mg  4 mg Oral Q6H PRN Reyne Dumas, MD       Or  . ondansetron (ZOFRAN) injection 4 mg  4 mg Intravenous Q6H PRN Reyne Dumas, MD       Current Outpatient Prescriptions  Medication Sig Dispense Refill  . atorvastatin (LIPITOR) 40 MG tablet Take 40 mg by mouth daily.    . budesonide (ENTOCORT EC) 3 MG 24 hr capsule Take 3 capsules (9 mg total) by mouth daily. 90 capsule 1  . Cholecalciferol (VITAMIN D) 2000 UNITS tablet Take 2,000 Units by mouth daily.    . fexofenadine (ALLEGRA) 30 MG tablet Take 30 mg by mouth daily as needed (for allergies).     . hyoscyamine (LEVSIN SL) 0.125 MG SL tablet Place 0.125 mg under the tongue 3 (three) times daily as needed for cramping.      Allergies as of 09/29/2014  . (No Known Allergies)    Family History  Problem Relation Age of Onset  . Heart disease Maternal Grandfather   . Heart disease Paternal  Grandfather   . Heart disease Father     History   Social History  . Marital Status: Married    Spouse Name: N/A  . Number of Children: 1  . Years of Education: N/A   Occupational History  . [employer]    Social History Main Topics  . Smoking status: Never Smoker   . Smokeless tobacco: Not on file  . Alcohol Use: 0.0 oz/week    0 Standard drinks or equivalent per week     Comment: glass of wine occasionally   . Drug Use: No  . Sexual Activity: Not on file   Other Topics Concern  . Not on file   Social History Narrative    Review of Systems: Gen: Denies any fever, chills,  sweats. Has felt fatigued. CV: Denies chest pain, angina, palpitations, syncope, orthopnea, PND, peripheral edema, and claudication. Resp: Denies dyspnea at rest, dyspnea with exercise, cough, sputum, wheezing, coughing up blood, and pleurisy. GI: Denies vomiting blood, jaundice, and fecal incontinence.   Denies dysphagia or odynophagia. GU : Denies urinary burning, blood in urine, urinary frequency, urinary hesitancy, nocturnal urination, and urinary incontinence. MS: Denies joint pain, limitation of movement, and swelling, stiffness, low back pain, extremity pain. Denies muscle weakness, cramps, atrophy.  Derm: Denies rash, itching, dry skin, hives, moles, warts, or unhealing ulcers.  Psych: Denies depression, anxiety, memory loss, suicidal ideation, hallucinations, paranoia, and confusion. Heme: Denies bruising, bleeding, and enlarged lymph nodes. Neuro:  Denies any headaches, dizziness, paresthesias. Endo:  Denies any problems with DM, thyroid, adrenal function.  Physical Exam: Vital signs in last 24 hours: Temp:  [98.3 F (36.8 C)] 98.3 F (36.8 C) (05/03 0738) Pulse Rate:  [68-75] 68 (05/03 1030) Resp:  [18] 18 (05/03 1030) BP: (122-138)/(62-78) 122/62 mmHg (05/03 1030) SpO2:  [93 %-99 %] 93 % (05/03 1030)   General:   Alert,  Well-developed, well-nourished, pleasant and cooperative in  NAD Head:  Normocephalic and atraumatic. Eyes:  Sclera clear, no icterus. Conjunctiva pink. Ears:  Normal auditory acuity. Nose:  No deformity, discharge,  or lesions. Mouth:  No deformity or lesions.   Neck:  Supple; no masses or thyromegaly. Lungs:  Clear throughout to auscultation.   No wheezes, crackles, or rhonchi.  Heart:  Regular rate and rhythm; no murmurs, clicks, rubs,  or gallops. Abdomen:  Soft,mildly distended, diminished bowel sounds, mild diffuse tenderness, no rebound or guarding  Rectal:  Deferred  Msk:  Symmetrical without gross deformities. . Pulses:  Normal pulses noted. Extremities: Without clubbing or edema. Neurologic:Alert and  oriented x4;  grossly normal neurologically. Skin: Intact without significant lesions or rashes.. Psych: Alert and cooperative. Normal mood and affect.   Lab Results:  Recent Labs  09/29/14 0809  WBC 9.0  HGB 16.5  HCT 49.1  PLT 208   BMET  Recent Labs  09/29/14 0809  NA 137  K 4.1  CL 105  CO2 27  GLUCOSE 113*  BUN 17  CREATININE 0.97  CALCIUM 9.1   LFT  Recent Labs  09/29/14 0809  PROT 7.2  ALBUMIN 4.1  AST 14*  ALT 18  ALKPHOS 65  BILITOT 1.6*       Studies/Results: Ct Abdomen Pelvis W Contrast  09/29/2014   CLINICAL DATA:  Abdominal pain, nausea and bloating, endoscopy capsule 6 weeks ago, did not expelled the capsule.  EXAM: CT ABDOMEN AND PELVIS WITH CONTRAST  TECHNIQUE: Multidetector CT imaging of the abdomen and pelvis was performed using the standard protocol following bolus administration of intravenous contrast.  CONTRAST:  100 cc Omnipaque  COMPARISON:  Abdominal x-ray 09/18/2014  FINDINGS: Lung bases are unremarkable. Sagittal images of the spine shows mild degenerative changes lower thoracic spine. Enhanced liver shows no biliary ductal dilatation. Multiple hepatic cysts are noted. The largest cyst in right hepatic lobe measures 4.3 cm. The largest cyst in left hepatic lobe measures 2.5 cm. No  calcified gallstones are noted within gallbladder. The pancreas, spleen and adrenal glands are unremarkable. Kidneys are symmetrical in size and enhancement. No hydronephrosis or hydroureter. There is a cyst in midpole anterior aspect of the left kidney measures 2.2 cm. Cyst in midpole of the left kidney posterior aspect measures 1.2 cm.  Delayed renal images shows bilateral renal symmetrical excretion. Bilateral visualized  proximal ureter is unremarkable. There is oral contrast material within stomach and proximal small bowel. There are fluid distended small bowel loops in mid and lower abdomen. Significant fluid distended small bowel loops in mid pelvis with air-fluid levels. Axial image 66 there is markedly distended small bowel loop in mid lower abdomen up to 5 cm. Fecal like material noted in a segment of small bowel which measures about 9 cm length. The obstruction is at the level of endoscopy capsule which is visualized in axial image 66 measures 1.6 cm. Beyond the capsule the small bowel is small caliber. The terminal ileum is decompressed. No any contrast material is noted within distal small bowel or within right colon. Prostate gland and seminal vesicles are unremarkable. There is tiny amount of mesenteric fluid adjacent to distended small bowel loop in axial image 55. Small nonspecific mesenteric lymph nodes are noted in right lower quadrant mesentery.  The patient is status post appendectomy. There is no pericecal inflammation. No inguinal adenopathy.  IMPRESSION: 1. There is small bowel obstruction with distended small bowel loops and air-fluid levels in mid lower abdomen and pelvis. The transition point in caliber is at the level of ingested capsule best seen in axial image 66. There is distension of proximal small bowel with fecal like material just above the capsule. The distal small bowel is decompressed small caliber. 2. No pericecal inflammation. The patient is status post appendectomy. 3. No  hydronephrosis or hydroureter. 4. Hepatic cysts are noted. No intrahepatic biliary ductal dilatation.  These results were called by telephone at the time of interpretation on 09/29/2014 at 10:30 am to Dr. Joseph Berkshire , who verbally acknowledged these results.   Electronically Signed   By: Lahoma Crocker M.D.   On: 09/29/2014 10:33    IMPRESSION/PLAN:  59 year old male with a history of Crohn's enteritis/cecitis status post capsule study several weeks ago, -- the patient has not seen capsule pass. Patient developed abdominal pain with nausea and vomiting overnight. CT today revealed small bowel obstruction with distal distended small bowel loops and air-fluid levels in the mid lower abdomen and pelvis with transition point at the level of the ingested capsule. There is distention of the proximal small bowel with fecal like material just above the capsule and the distal small bowel was decompressed small caliber. Patient is to be admitted for bowel rest, NG tube decompression. It is unlikely that he will pass capsule on his own. Surgery is evaluating patient is well. Patient has been intolerant to 6-MP in the past, and mesalamine and 5 ASA type medications do not work well for Crohn's disease, particularly in the small bowel. Will obtain TB QuantiFERON and hepatitis panel in meantime in anticipation of future biologic therapy.   Hvozdovic, Deloris Ping 09/29/2014,  Pager [phone]   ________________________________________________________________________  Velora Heckler GI MD note:  I personally examined the patient, reviewed the data and agree with the assessment and plan described above.  The capsule appears to be obstructed at the proximal edge of what may be a fairly long SB stricture.  Based on CRP, imaging I do not think there is a significant inflammatory component to the stricture. Rather it is probably fixed, scar long standing disease.  I hope the capsule will pass through but I don't think that will  be the case.  If it does not pass through, he should have surgery to remove it and resect obviously diseased bowel if appropriate.  I discussed with Dr. Rosendo Gros and he agrees.  Will follow along.   Owens Loffler, MD [facility] Gastroenterology Pager [phone]

## 2014-09-29 NOTE — ED Provider Notes (Signed)
CSN: [id number]     Arrival date & time 09/29/14  0732 History   First MD Initiated Contact with Patient 09/29/14 [phone]     Chief Complaint  Patient presents with  . Abdominal Pain     (Consider location/radiation/quality/duration/timing/severity/associated sxs/prior Treatment) HPI Comments: Patient presents for evaluation of abdominal pain which began yesterday. Patient reports a previous history of Crohn's disease. He reports that he is experiencing what he feels is a flare. She started to have nausea, bloating, abdominal distention and then vomiting. He is complaining of severe and constant pain. He has not had any diarrhea or rectal bleeding. Patient reports that he had capsule endoscopy 6 weeks ago and the capsule never passed through. He has been getting x-rays every 1-2 weeks to follow transit.  Patient is a 59 y.o. male presenting with abdominal pain.  Abdominal Pain Associated symptoms: nausea and vomiting     Past Medical History  Diagnosis Date  . Allergic rhinitis     pollen, animal hair, dust mites  . HLD (hyperlipidemia)   . Crohn disease   . Hemorrhoids, internal, thrombosed s/p resection/pexy 12/06/2012 12/04/2012  . Diverticulosis    Past Surgical History  Procedure Laterality Date  . Appendectomy    . Tonsillectomy    . Laminectomy    . Hemorroidectomy    . Retinal tear repair cryotherapy Right 06/09/14    repaired with laser   Family History  Problem Relation Age of Onset  . Heart disease Maternal Grandfather   . Heart disease Paternal Grandfather   . Heart disease Father    History  Substance Use Topics  . Smoking status: Never Smoker   . Smokeless tobacco: Not on file  . Alcohol Use: 0.0 oz/week    0 Standard drinks or equivalent per week     Comment: glass of wine occasionally     Review of Systems  Gastrointestinal: Positive for nausea, vomiting and abdominal pain. Negative for blood in stool.  All other systems reviewed and are  negative.     Allergies  Review of patient's allergies indicates no known allergies.  Home Medications   Prior to Admission medications   Medication Sig Start Date End Date Taking? Authorizing Provider  atorvastatin (LIPITOR) 40 MG tablet Take 40 mg by mouth daily.   Yes Historical Provider, MD  budesonide (ENTOCORT EC) 3 MG 24 hr capsule Take 3 capsules (9 mg total) by mouth daily. 09/03/14  Yes Jerene Bears, MD  Cholecalciferol (VITAMIN D) 2000 UNITS tablet Take 2,000 Units by mouth daily.   Yes Historical Provider, MD  fexofenadine (ALLEGRA) 30 MG tablet Take 30 mg by mouth daily as needed (for allergies).    Yes Historical Provider, MD  hyoscyamine (LEVSIN SL) 0.125 MG SL tablet Place 0.125 mg under the tongue 3 (three) times daily as needed for cramping.   Yes Historical Provider, MD   BP 122/62 mmHg  Pulse 68  Temp(Src) 98.3 F (36.8 C) (Oral)  Resp 18  SpO2 93% Physical Exam  Constitutional: He is oriented to person, place, and time. He appears well-developed and well-nourished. No distress.  HENT:  Head: Normocephalic and atraumatic.  Right Ear: Hearing normal.  Left Ear: Hearing normal.  Nose: Nose normal.  Mouth/Throat: Oropharynx is clear and moist and mucous membranes are normal.  Eyes: Conjunctivae and EOM are normal. Pupils are equal, round, and reactive to light.  Neck: Normal range of motion. Neck supple.  Cardiovascular: Regular rhythm, S1 normal and S2 normal.  Exam reveals no gallop and no friction rub.   No murmur heard. Pulmonary/Chest: Effort normal and breath sounds normal. No respiratory distress. He exhibits no tenderness.  Abdominal: Normal appearance. He exhibits distension (mild). Bowel sounds are decreased. There is no hepatosplenomegaly. There is generalized tenderness. There is no rebound, no guarding, no tenderness at McBurney's point and negative Murphy's sign. No hernia.  Musculoskeletal: Normal range of motion.  Neurological: He is alert and  oriented to person, place, and time. He has normal strength. No cranial nerve deficit or sensory deficit. Coordination normal. GCS eye subscore is 4. GCS verbal subscore is 5. GCS motor subscore is 6.  Skin: Skin is warm, dry and intact. No rash noted. No cyanosis.  Psychiatric: He has a normal mood and affect. His speech is normal and behavior is normal. Thought content normal.  Nursing note and vitals reviewed.   ED Course  Procedures (including critical care time) Labs Review Labs Reviewed  CBC WITH DIFFERENTIAL/PLATELET - Abnormal; Notable for the following:    Neutrophils Relative % 86 (*)    Lymphocytes Relative 5 (*)    Lymphs Abs 0.5 (*)    All other components within normal limits  COMPREHENSIVE METABOLIC PANEL - Abnormal; Notable for the following:    Glucose, Bld 113 (*)    AST 14 (*)    Total Bilirubin 1.6 (*)    All other components within normal limits  LIPASE, BLOOD  URINALYSIS, ROUTINE W REFLEX MICROSCOPIC  CBC  CREATININE, SERUM  MAGNESIUM  I-STAT CG4 LACTIC ACID, ED    Imaging Review Ct Abdomen Pelvis W Contrast  09/29/2014   CLINICAL DATA:  Abdominal pain, nausea and bloating, endoscopy capsule 6 weeks ago, did not expelled the capsule.  EXAM: CT ABDOMEN AND PELVIS WITH CONTRAST  TECHNIQUE: Multidetector CT imaging of the abdomen and pelvis was performed using the standard protocol following bolus administration of intravenous contrast.  CONTRAST:  100 cc Omnipaque  COMPARISON:  Abdominal x-ray 09/18/2014  FINDINGS: Lung bases are unremarkable. Sagittal images of the spine shows mild degenerative changes lower thoracic spine. Enhanced liver shows no biliary ductal dilatation. Multiple hepatic cysts are noted. The largest cyst in right hepatic lobe measures 4.3 cm. The largest cyst in left hepatic lobe measures 2.5 cm. No calcified gallstones are noted within gallbladder. The pancreas, spleen and adrenal glands are unremarkable. Kidneys are symmetrical in size and  enhancement. No hydronephrosis or hydroureter. There is a cyst in midpole anterior aspect of the left kidney measures 2.2 cm. Cyst in midpole of the left kidney posterior aspect measures 1.2 cm.  Delayed renal images shows bilateral renal symmetrical excretion. Bilateral visualized proximal ureter is unremarkable. There is oral contrast material within stomach and proximal small bowel. There are fluid distended small bowel loops in mid and lower abdomen. Significant fluid distended small bowel loops in mid pelvis with air-fluid levels. Axial image 66 there is markedly distended small bowel loop in mid lower abdomen up to 5 cm. Fecal like material noted in a segment of small bowel which measures about 9 cm length. The obstruction is at the level of endoscopy capsule which is visualized in axial image 66 measures 1.6 cm. Beyond the capsule the small bowel is small caliber. The terminal ileum is decompressed. No any contrast material is noted within distal small bowel or within right colon. Prostate gland and seminal vesicles are unremarkable. There is tiny amount of mesenteric fluid adjacent to distended small bowel loop in axial image 55. Small  nonspecific mesenteric lymph nodes are noted in right lower quadrant mesentery.  The patient is status post appendectomy. There is no pericecal inflammation. No inguinal adenopathy.  IMPRESSION: 1. There is small bowel obstruction with distended small bowel loops and air-fluid levels in mid lower abdomen and pelvis. The transition point in caliber is at the level of ingested capsule best seen in axial image 66. There is distension of proximal small bowel with fecal like material just above the capsule. The distal small bowel is decompressed small caliber. 2. No pericecal inflammation. The patient is status post appendectomy. 3. No hydronephrosis or hydroureter. 4. Hepatic cysts are noted. No intrahepatic biliary ductal dilatation.  These results were called by telephone at the  time of interpretation on 09/29/2014 at 10:30 am to Dr. Joseph Berkshire , who verbally acknowledged these results.   Electronically Signed   By: Lahoma Crocker M.D.   On: 09/29/2014 10:33     EKG Interpretation None      MDM   Final diagnoses:  Abdominal pain  Small bowel obstruction    Patient presents to the ER for evaluation of abdominal pain with nausea and vomiting that began yesterday. Patient has a history of Crohn's, feels like a pain exacerbation. Patient did, however, recently have colonoscopy performed. This was 6 weeks ago and he has not passed pill. He has been getting regular x-rays to follow its transition. Based on this, obstruction secondary to the pill was of concern.  Patient's lab work is unremarkable. Examination reveals mild distention and diffuse tenderness without signs of peritonitis. Bowel sounds are present but diminished. CT scan performed, does show evidence of small bowel obstruction with transition at the pill.  GI consultation obtained. Patient also will be seen by general surgery. He will be admitted to the hospitalist service.    Orpah Greek, MD 09/29/14 [phone]

## 2014-09-29 NOTE — H&P (Signed)
[facility] History and Physical  Terry Kerr BMW:[id number] DOB: 04-30-1956 DOA: 09/29/2014  Referring physician: *  PCP: Terry Redwood, MD   Chief Complaint: Abdominal Pain  HPI:  59 y/o with history of Crohn's since 1980,He was recently evaluated by Terry. Hilarie Kerr in January 2016 to establish care. His last colonoscopy at that time had been in 2010 by Terry Kerr.06/26/2014 he had another colonoscopy which noted granular mucosa at the cecal base .Capsule endoscopy was performed on 09/10/2014 and revealed mild active Crohn's enteritis with ulcerations, erosions, and edema, and Biologics were recommended. Patient was seen in the office of Terry Kerr on April 7 which time he reported that he was feeling unwell with the symptoms of abdominal discomfort. He had no nausea or vomiting. Terry Kerr the patient had roasted beets and sweet potatoes for lunch and shortly thereafter began to experience abdominal distention and pain. His last bowel movement was yesterday. He vomited last evening. Patient called the GI office and was advised to go to the [facility] long emergency room for evaluation. CT performed today showed small bowel obstruction with distended small bowel loops and air-fluid levels in the mid lower abdomen and pelvis the transition point was at the level of the ingested capsule. Small bowel distal to the capsule was small in caliber.     Review of Systems: negative for the following  Constitutional: Denies fever, chills, diaphoresis, appetite change and fatigue.  HEENT: Denies photophobia, eye pain, redness, hearing loss, ear pain, congestion, sore throat, rhinorrhea, sneezing, mouth sores, trouble swallowing, neck pain, neck stiffness and tinnitus.  Respiratory: Denies SOB, DOE, cough, chest tightness, and wheezing.  Cardiovascular: Denies chest pain, palpitations and leg swelling.  Gastrointestinal: Positive for nausea, vomiting and abdominal pain. Negative for blood in  stool. Genitourinary: Denies dysuria, urgency, frequency, hematuria, flank pain and difficulty urinating.  Musculoskeletal: Denies myalgias, back pain, joint swelling, arthralgias and gait problem.  Skin: Denies pallor, rash and wound.  Neurological: Denies dizziness, seizures, syncope, weakness, light-headedness, numbness and headaches.  Hematological: Denies adenopathy. Easy bruising, personal or family bleeding history  Psychiatric/Behavioral: Denies suicidal ideation, mood changes, confusion, nervousness, sleep disturbance and agitation       Past Medical History  Diagnosis Date  . Allergic rhinitis     pollen, animal hair, dust mites  . HLD (hyperlipidemia)   . Crohn disease   . Hemorrhoids, internal, thrombosed s/p resection/pexy 12/06/2012 12/04/2012  . Diverticulosis      Past Surgical History  Procedure Laterality Date  . Appendectomy    . Tonsillectomy    . Laminectomy    . Hemorroidectomy    . Retinal tear repair cryotherapy Right 06/09/14    repaired with laser      Social History:  reports that he has never smoked. He does not have any smokeless tobacco history on file. He reports that he drinks alcohol. He reports that he does not use illicit drugs.    No Known Allergies  Family History  Problem Relation Age of Onset  . Heart disease Maternal Grandfather   . Heart disease Paternal Grandfather   . Heart disease Father          Prior to Admission medications   Medication Sig Start Date End Date Taking? Authorizing Provider  atorvastatin (LIPITOR) 40 MG tablet Take 40 mg by mouth daily.   Yes Historical Provider, MD  budesonide (ENTOCORT EC) 3 MG 24 hr capsule Take 3 capsules (9 mg total) by mouth daily. 09/03/14  Yes Terry Lines Pyrtle,  MD  Cholecalciferol (VITAMIN D) 2000 UNITS tablet Take 2,000 Units by mouth daily.   Yes Historical Provider, MD  fexofenadine (ALLEGRA) 30 MG tablet Take 30 mg by mouth daily as needed (for allergies).    Yes Historical  Provider, MD  hyoscyamine (LEVSIN SL) 0.125 MG SL tablet Place 0.125 mg under the tongue 3 (three) times daily as needed for cramping.   Yes Historical Provider, MD     Physical Exam: Filed Vitals:   09/29/14 0738 09/29/14 1030 09/29/14 1100  BP: 138/78 122/62 139/75  Pulse: 75 68 70  Temp: 98.3 F (36.8 C)  98.5 F (36.9 C)  TempSrc: Oral  Oral  Resp: 18 18 18   Height:   5' 8"  (1.727 m)  Weight:   85.276 kg (188 lb)  SpO2: 99% 93% 93%     Constitutional: Vital signs reviewed. Patient is a well-developed and well-nourished in no acute distress and cooperative with exam. Alert and oriented x3.  Head: Normocephalic and atraumatic  Ear: TM normal bilaterally  Mouth: no erythema or exudates, MMM  Eyes: PERRL, EOMI, conjunctivae normal, No scleral icterus.  Neck: Supple, Trachea midline normal ROM, No JVD, mass, thyromegaly, or carotid bruit present.  Cardiovascular: RRR, S1 normal, S2 normal, no MRG, pulses symmetric and intact bilaterally  Pulmonary/Chest: CTAB, no wheezes, rales, or rhonchi  Abdominal: Normal appearance. He exhibits distension (mild). Bowel sounds are decreased. There is no hepatosplenomegaly. There is generalized tenderness. There is no rebound, no guarding, no tenderness at McBurney's point and negative Murphy's sign. No hernia.  Musculoskeletal: Normal range of motion.  GU: no CVA tenderness Musculoskeletal: No joint deformities, erythema, or stiffness, ROM full and no nontender Ext: no edema and no cyanosis, pulses palpable bilaterally (DP and PT)  Hematology: no cervical, inginal, or axillary adenopathy.  Neurological: A&O x3, Strenght is normal and symmetric bilaterally, cranial nerve II-XII are grossly intact, no focal motor deficit, sensory intact to light touch bilaterally.  Skin: Warm, dry and intact. No rash, cyanosis, or clubbing.  Psychiatric: Normal mood and affect. speech and behavior is normal. Judgment and thought content normal. Cognition and  memory are normal.       Labs on Admission:    Basic Metabolic Panel:  Recent Labs Lab 09/29/14 0809  NA 137  K 4.1  CL 105  CO2 27  GLUCOSE 113*  BUN 17  CREATININE 0.97  CALCIUM 9.1  MG 2.1   Liver Function Tests:  Recent Labs Lab 09/29/14 0809  AST 14*  ALT 18  ALKPHOS 65  BILITOT 1.6*  PROT 7.2  ALBUMIN 4.1    Recent Labs Lab 09/29/14 0809  LIPASE 23   No results for input(s): AMMONIA in the last 168 hours. CBC:  Recent Labs Lab 09/29/14 0809  WBC 9.0  NEUTROABS 7.7  HGB 16.5  HCT 49.1  MCV 90.6  PLT 208   Cardiac Enzymes: No results for input(s): CKTOTAL, CKMB, CKMBINDEX, TROPONINI in the last 168 hours.  BNP (last 3 results) No results for input(s): BNP in the last 8760 hours.  ProBNP (last 3 results) No results for input(s): PROBNP in the last 8760 hours.     CBG: No results for input(s): GLUCAP in the last 168 hours.  Radiological Exams on Admission: Ct Abdomen Pelvis W Contrast  09/29/2014   CLINICAL DATA:  Abdominal pain, nausea and bloating, endoscopy capsule 6 weeks ago, did not expelled the capsule.  EXAM: CT ABDOMEN AND PELVIS WITH CONTRAST  TECHNIQUE: Multidetector CT imaging  of the abdomen and pelvis was performed using the standard protocol following bolus administration of intravenous contrast.  CONTRAST:  100 cc Omnipaque  COMPARISON:  Abdominal x-ray 09/18/2014  FINDINGS: Lung bases are unremarkable. Sagittal images of the spine shows mild degenerative changes lower thoracic spine. Enhanced liver shows no biliary ductal dilatation. Multiple hepatic cysts are noted. The largest cyst in right hepatic lobe measures 4.3 cm. The largest cyst in left hepatic lobe measures 2.5 cm. No calcified gallstones are noted within gallbladder. The pancreas, spleen and adrenal glands are unremarkable. Kidneys are symmetrical in size and enhancement. No hydronephrosis or hydroureter. There is a cyst in midpole anterior aspect of the left kidney  measures 2.2 cm. Cyst in midpole of the left kidney posterior aspect measures 1.2 cm.  Delayed renal images shows bilateral renal symmetrical excretion. Bilateral visualized proximal ureter is unremarkable. There is oral contrast material within stomach and proximal small bowel. There are fluid distended small bowel loops in mid and lower abdomen. Significant fluid distended small bowel loops in mid pelvis with air-fluid levels. Axial image 66 there is markedly distended small bowel loop in mid lower abdomen up to 5 cm. Fecal like material noted in a segment of small bowel which measures about 9 cm length. The obstruction is at the level of endoscopy capsule which is visualized in axial image 66 measures 1.6 cm. Beyond the capsule the small bowel is small caliber. The terminal ileum is decompressed. No any contrast material is noted within distal small bowel or within right colon. Prostate gland and seminal vesicles are unremarkable. There is tiny amount of mesenteric fluid adjacent to distended small bowel loop in axial image 55. Small nonspecific mesenteric lymph nodes are noted in right lower quadrant mesentery.  The patient is status post appendectomy. There is no pericecal inflammation. No inguinal adenopathy.  IMPRESSION: 1. There is small bowel obstruction with distended small bowel loops and air-fluid levels in mid lower abdomen and pelvis. The transition point in caliber is at the level of ingested capsule best seen in axial image 66. There is distension of proximal small bowel with fecal like material just above the capsule. The distal small bowel is decompressed small caliber. 2. No pericecal inflammation. The patient is status post appendectomy. 3. No hydronephrosis or hydroureter. 4. Hepatic cysts are noted. No intrahepatic biliary ductal dilatation.  These results were called by telephone at the time of interpretation on 09/29/2014 at 10:30 am to Terry. Joseph Berkshire , who verbally acknowledged these  results.   Electronically Signed   By: Lahoma Crocker M.D.   On: 09/29/2014 10:33    EKG: Independently reviewed.  Assessment/Plan Active Problems:   SBO (small bowel obstruction)   Crohn's enteritis/cecitis status post capsule study several weeks ago Appreciate surgery and GI input  bowel rest, NG tube decompression intolerant to 6-MP in the past,   mesalamine and 5 ASA type medications do not work well for Crohn's disease, particularly in the small bowel.   TB QuantiFERON and hepatitis panel in meantime in anticipation of future biologic therapy. Terry. Rosendo Gros has reviewed the study and discussed it with Terry. Hilarie Kerr. Would like to   watch him for 24-48 hours, decompress with NG and see if it passes Given one dose of solumedrol in the ED  HYDRATE AGGRESSIVELY  On enterocort and levsin at home   Dyslipidemia Hold lipitor    Code Status:   full Family Communication: bedside Disposition Plan: admit   Time spent: 70 mins  [facility] Pager [phone]  If 7PM-7AM, please contact night-coverage [email or web address] Password [email or web address] 09/29/2014, 12:58 PM

## 2014-09-29 NOTE — Telephone Encounter (Signed)
History of Crohn's disease.  Developed severe abdominal pain with nausea and vomiting overnite.  Apparantly had capsule study several weeks ago but claims capsule did not pass. Instructed to go to [facility] ED for evaluation.

## 2014-09-29 NOTE — Progress Notes (Signed)
This nurse called ED to get report,per secretary,nurse is busy placing NGT.

## 2014-09-29 NOTE — ED Notes (Signed)
Patient is aware that we need a urine specimen, does not feel like he can urinate at this time. Will let us know when he is able

## 2014-09-29 NOTE — Progress Notes (Signed)
Patient arrived to unit at around 1245. Alert and orientedx4. Denies pain. NGT to L nare intact to low cont. suction. Placed comfortably in bed. Text paged Dr. Allyson Sabal earlier about patient's arrival.

## 2014-09-29 NOTE — Consult Note (Signed)
Reason for Consult:  SBO Referring Physician:  Dr. Joseph Berkshire  Terry Kerr is an 59 y.o. male.  HPI:  59 y/o with history of Crohn's since 1980  He has an endoscopy and capsule study 6 weeks ago.  He reported it had not been expelled. Yesterday he reports a flare with nausea and bloating, abdominal distension and then vomiting. He is having severe and constant pain.  No diarrhea or rectal bleeding.    He called GI and was sent to the ED.  He says symptoms are exactly like his bad flares.  He is feeling better now.  Last BM yesterday. Work up shows he is afebrile, Bilirubin is up some, but other labs are normal. CT scan shows:There is small bowel obstruction with distended small bowel loops and air-fluid levels in mid lower abdomen and pelvis. The transition point in caliber is at the level of ingested capsule best seen in axial image 66. There is distension of proximal small bowel with fecal like material just above the capsule. The distal small bowel is decompressed small caliber. We are called to see.  Past Medical History  Diagnosis Date  . Allergic rhinitis     pollen, animal hair, dust mites  . HLD (hyperlipidemia)   . Crohn disease   . Hemorrhoids, internal, thrombosed s/p resection/pexy 12/06/2012 12/04/2012  . Diverticulosis     Past Surgical History  Procedure Laterality Date  . Appendectomy    . Tonsillectomy    . Laminectomy    . Hemorroidectomy    . Retinal tear repair cryotherapy Right 06/09/14    repaired with laser    Family History  Problem Relation Age of Onset  . Heart disease Maternal Grandfather   . Heart disease Paternal Grandfather   . Heart disease Father     Social History:  reports that he has never smoked. He does not have any smokeless tobacco history on file. He reports that he drinks alcohol. He reports that he does not use illicit drugs.  Allergies: No Known Allergies  Prior to Admission medications   Medication Sig Start Date End Date  Taking? Authorizing Provider  atorvastatin (LIPITOR) 40 MG tablet Take 40 mg by mouth daily.   Yes Historical Provider, MD  budesonide (ENTOCORT EC) 3 MG 24 hr capsule Take 3 capsules (9 mg total) by mouth daily. 09/03/14  Yes Jerene Bears, MD  Cholecalciferol (VITAMIN D) 2000 UNITS tablet Take 2,000 Units by mouth daily.   Yes Historical Provider, MD  fexofenadine (ALLEGRA) 30 MG tablet Take 30 mg by mouth daily as needed (for allergies).    Yes Historical Provider, MD  hyoscyamine (LEVSIN SL) 0.125 MG SL tablet Place 0.125 mg under the tongue 3 (three) times daily as needed for cramping.   Yes Historical Provider, MD     Results for orders placed or performed during the hospital encounter of 09/29/14 (from the past 48 hour(s))  CBC with Differential/Platelet     Status: Abnormal   Collection Time: 09/29/14  8:09 AM  Result Value Ref Range   WBC 9.0 4.0 - 10.5 K/uL   RBC 5.42 4.22 - 5.81 MIL/uL   Hemoglobin 16.5 13.0 - 17.0 g/dL   HCT 49.1 39.0 - 52.0 %   MCV 90.6 78.0 - 100.0 fL   MCH 30.4 26.0 - 34.0 pg   MCHC 33.6 30.0 - 36.0 g/dL   RDW 13.3 11.5 - 15.5 %   Platelets 208 150 - 400 K/uL   Neutrophils Relative %  86 (H) 43 - 77 %   Neutro Abs 7.7 1.7 - 7.7 K/uL   Lymphocytes Relative 5 (L) 12 - 46 %   Lymphs Abs 0.5 (L) 0.7 - 4.0 K/uL   Monocytes Relative 9 3 - 12 %   Monocytes Absolute 0.8 0.1 - 1.0 K/uL   Eosinophils Relative 0 0 - 5 %   Eosinophils Absolute 0.0 0.0 - 0.7 K/uL   Basophils Relative 0 0 - 1 %   Basophils Absolute 0.0 0.0 - 0.1 K/uL  Comprehensive metabolic panel     Status: Abnormal   Collection Time: 09/29/14  8:09 AM  Result Value Ref Range   Sodium 137 135 - 145 mmol/L   Potassium 4.1 3.5 - 5.1 mmol/L   Chloride 105 101 - 111 mmol/L   CO2 27 22 - 32 mmol/L   Glucose, Bld 113 (H) 70 - 99 mg/dL   BUN 17 6 - 20 mg/dL   Creatinine, Ser 0.97 0.61 - 1.24 mg/dL   Calcium 9.1 8.9 - 10.3 mg/dL   Total Protein 7.2 6.5 - 8.1 g/dL   Albumin 4.1 3.5 - 5.0 g/dL   AST  14 (L) 15 - 41 U/L   ALT 18 17 - 63 U/L   Alkaline Phosphatase 65 38 - 126 U/L   Total Bilirubin 1.6 (H) 0.3 - 1.2 mg/dL   GFR calc non Af Amer >60 >60 mL/min   GFR calc Af Amer >60 >60 mL/min    Comment: (NOTE) The eGFR has been calculated using the CKD EPI equation. This calculation has not been validated in all clinical situations. eGFR's persistently <90 mL/min signify possible Chronic Kidney Disease.    Anion gap 5 5 - 15  Lipase, blood     Status: None   Collection Time: 09/29/14  8:09 AM  Result Value Ref Range   Lipase 23 22 - 51 U/L  I-Stat CG4 Lactic Acid, ED     Status: None   Collection Time: 09/29/14  8:26 AM  Result Value Ref Range   Lactic Acid, Venous 0.92 0.5 - 2.0 mmol/L  Urinalysis, Routine w reflex microscopic     Status: None   Collection Time: 09/29/14  9:39 AM  Result Value Ref Range   Color, Urine YELLOW YELLOW   APPearance CLEAR CLEAR   Specific Gravity, Urine 1.009 1.005 - 1.030   pH 6.5 5.0 - 8.0   Glucose, UA NEGATIVE NEGATIVE mg/dL   Hgb urine dipstick NEGATIVE NEGATIVE   Bilirubin Urine NEGATIVE NEGATIVE   Ketones, ur NEGATIVE NEGATIVE mg/dL   Protein, ur NEGATIVE NEGATIVE mg/dL   Urobilinogen, UA 0.2 0.0 - 1.0 mg/dL   Nitrite NEGATIVE NEGATIVE   Leukocytes, UA NEGATIVE NEGATIVE    Comment: MICROSCOPIC NOT DONE ON URINES WITH NEGATIVE PROTEIN, BLOOD, LEUKOCYTES, NITRITE, OR GLUCOSE <1000 mg/dL.    Ct Abdomen Pelvis W Contrast  09/29/2014   CLINICAL DATA:  Abdominal pain, nausea and bloating, endoscopy capsule 6 weeks ago, did not expelled the capsule.  EXAM: CT ABDOMEN AND PELVIS WITH CONTRAST  TECHNIQUE: Multidetector CT imaging of the abdomen and pelvis was performed using the standard protocol following bolus administration of intravenous contrast.  CONTRAST:  100 cc Omnipaque  COMPARISON:  Abdominal x-ray 09/18/2014  FINDINGS: Lung bases are unremarkable. Sagittal images of the spine shows mild degenerative changes lower thoracic spine.  Enhanced liver shows no biliary ductal dilatation. Multiple hepatic cysts are noted. The largest cyst in right hepatic lobe measures 4.3 cm. The largest  cyst in left hepatic lobe measures 2.5 cm. No calcified gallstones are noted within gallbladder. The pancreas, spleen and adrenal glands are unremarkable. Kidneys are symmetrical in size and enhancement. No hydronephrosis or hydroureter. There is a cyst in midpole anterior aspect of the left kidney measures 2.2 cm. Cyst in midpole of the left kidney posterior aspect measures 1.2 cm.  Delayed renal images shows bilateral renal symmetrical excretion. Bilateral visualized proximal ureter is unremarkable. There is oral contrast material within stomach and proximal small bowel. There are fluid distended small bowel loops in mid and lower abdomen. Significant fluid distended small bowel loops in mid pelvis with air-fluid levels. Axial image 66 there is markedly distended small bowel loop in mid lower abdomen up to 5 cm. Fecal like material noted in a segment of small bowel which measures about 9 cm length. The obstruction is at the level of endoscopy capsule which is visualized in axial image 66 measures 1.6 cm. Beyond the capsule the small bowel is small caliber. The terminal ileum is decompressed. No any contrast material is noted within distal small bowel or within right colon. Prostate gland and seminal vesicles are unremarkable. There is tiny amount of mesenteric fluid adjacent to distended small bowel loop in axial image 55. Small nonspecific mesenteric lymph nodes are noted in right lower quadrant mesentery.  The patient is status post appendectomy. There is no pericecal inflammation. No inguinal adenopathy.  IMPRESSION: 1. There is small bowel obstruction with distended small bowel loops and air-fluid levels in mid lower abdomen and pelvis. The transition point in caliber is at the level of ingested capsule best seen in axial image 66. There is distension of  proximal small bowel with fecal like material just above the capsule. The distal small bowel is decompressed small caliber. 2. No pericecal inflammation. The patient is status post appendectomy. 3. No hydronephrosis or hydroureter. 4. Hepatic cysts are noted. No intrahepatic biliary ductal dilatation.  These results were called by telephone at the time of interpretation on 09/29/2014 at 10:30 am to Dr. Joseph Berkshire , who verbally acknowledged these results.   Electronically Signed   By: Lahoma Crocker M.D.   On: 09/29/2014 10:33    Review of Systems  Constitutional: Negative.   HENT: Positive for hearing loss (wife says he hearing has declined).   Eyes: Negative.   Respiratory: Negative.   Cardiovascular: Negative.   Gastrointestinal: Positive for nausea, vomiting and abdominal pain. Negative for diarrhea, constipation, blood in stool and melena.  Genitourinary: Negative.   Skin: Negative.   Neurological: Negative.   Endo/Heme/Allergies: Negative.   Psychiatric/Behavioral: Negative.    Blood pressure 122/62, pulse 68, temperature 98.3 F (36.8 C), temperature source Oral, resp. rate 18, SpO2 93 %. Physical Exam  Constitutional: He is oriented to person, place, and time. He appears well-developed and well-nourished. No distress.  HENT:  Head: Normocephalic and atraumatic.  Nose: Nose normal.  Eyes: Conjunctivae are normal. Right eye exhibits no discharge. Left eye exhibits no discharge. No scleral icterus.  Neck: Normal range of motion. Neck supple. No JVD present. No tracheal deviation present. No thyromegaly present.  Cardiovascular: Normal rate, regular rhythm, normal heart sounds and intact distal pulses.  Exam reveals no gallop.   No murmur heard. Respiratory: Effort normal and breath sounds normal. No respiratory distress. He has no wheezes. He has no rales. He exhibits no tenderness.  GI: Soft. He exhibits distension. He exhibits no mass. There is no tenderness. There is no rebound  and  no guarding.  Mild distension, no pain , hyperactive bowel sounds.  Musculoskeletal: He exhibits no edema or tenderness.  Lymphadenopathy:    He has no cervical adenopathy.  Neurological: He is alert and oriented to person, place, and time. No cranial nerve deficit.  Skin: Skin is warm and dry. No rash noted. He is not diaphoretic. No erythema. No pallor.  Psychiatric: He has a normal mood and affect. His behavior is normal. Judgment and thought content normal.    Assessment/Plan: SBO with endoscopy capsule obstruction at the point of transition Crohn's disease since 1980 Hx of diverticulitis Hyperlipidemia  Plan:  Dr. Rosendo Gros has reviewed the study and discussed it with Dr. Hilarie Fredrickson.  It is their opinion we should watch him for 24-48 hours, decompress with NG and see if it passes.  We will follow with you.  Eddrick Dilone 09/29/2014, 10:58 AM

## 2014-09-29 NOTE — ED Notes (Signed)
MD at bedside. 

## 2014-09-29 NOTE — Progress Notes (Signed)
UR completed 

## 2014-09-30 ENCOUNTER — Inpatient Hospital Stay (HOSPITAL_COMMUNITY): Payer: Federal, State, Local not specified - PPO | Admitting: Anesthesiology

## 2014-09-30 ENCOUNTER — Encounter (HOSPITAL_COMMUNITY): Payer: Self-pay

## 2014-09-30 ENCOUNTER — Telehealth: Payer: Self-pay

## 2014-09-30 ENCOUNTER — Encounter (HOSPITAL_COMMUNITY)
Admission: EM | Disposition: A | Discharge status: Home / Self Care | Payer: Self-pay | Source: Home / Self Care | Attending: Internal Medicine

## 2014-09-30 ENCOUNTER — Inpatient Hospital Stay (HOSPITAL_COMMUNITY): Payer: Federal, State, Local not specified - PPO

## 2014-09-30 DIAGNOSIS — K50912 Crohn's disease, unspecified, with intestinal obstruction: Secondary | ICD-10-CM

## 2014-09-30 HISTORY — PX: COLON RESECTION: SHX5231

## 2014-09-30 LAB — CBC
HCT: 43.4 % (ref 39.0–52.0)
HEMOGLOBIN: 14.3 g/dL (ref 13.0–17.0)
MCH: 30.2 pg (ref 26.0–34.0)
MCHC: 32.9 g/dL (ref 30.0–36.0)
MCV: 91.8 fL (ref 78.0–100.0)
PLATELETS: 201 10*3/uL (ref 150–400)
RBC: 4.73 MIL/uL (ref 4.22–5.81)
RDW: 13.1 % (ref 11.5–15.5)
WBC: 8.3 10*3/uL (ref 4.0–10.5)

## 2014-09-30 LAB — COMPREHENSIVE METABOLIC PANEL
ALBUMIN: 3.3 g/dL — AB (ref 3.5–5.0)
ALT: 14 U/L — ABNORMAL LOW (ref 17–63)
AST: 14 U/L — AB (ref 15–41)
Alkaline Phosphatase: 50 U/L (ref 38–126)
Anion gap: 5 (ref 5–15)
BUN: 22 mg/dL — ABNORMAL HIGH (ref 6–20)
CHLORIDE: 108 mmol/L (ref 101–111)
CO2: 28 mmol/L (ref 22–32)
CREATININE: 1.01 mg/dL (ref 0.61–1.24)
Calcium: 8.3 mg/dL — ABNORMAL LOW (ref 8.9–10.3)
GFR calc Af Amer: >60 mL/min (ref >60)
GFR calc non Af Amer: >60 mL/min (ref >60)
Glucose, Bld: 116 mg/dL — ABNORMAL HIGH (ref 70–99)
Potassium: 4.1 mmol/L (ref 3.5–5.1)
SODIUM: 141 mmol/L (ref 135–145)
Total Bilirubin: 0.8 mg/dL (ref 0.3–1.2)
Total Protein: 5.9 g/dL — ABNORMAL LOW (ref 6.5–8.1)

## 2014-09-30 LAB — SURGICAL PCR SCREEN
MRSA, PCR: NEGATIVE
Staphylococcus aureus: NEGATIVE

## 2014-09-30 LAB — HEPATITIS A ANTIBODY, TOTAL: Hep A Total Ab: NEGATIVE

## 2014-09-30 LAB — MAGNESIUM: Magnesium: 2 mg/dL (ref 1.7–2.4)

## 2014-09-30 SURGERY — COLON RESECTION LAPAROSCOPIC
Anesthesia: General | Site: Abdomen

## 2014-09-30 MED ORDER — MIDAZOLAM HCL 2 MG/2ML IJ SOLN
INTRAMUSCULAR | Status: AC
  Filled 2014-09-30: qty 2

## 2014-09-30 MED ORDER — PROMETHAZINE HCL 25 MG/ML IJ SOLN: 6.2500 mg | INTRAMUSCULAR | Status: DC | PRN

## 2014-09-30 MED ORDER — FENTANYL CITRATE (PF) 100 MCG/2ML IJ SOLN
25.0000 ug | INTRAMUSCULAR | Status: DC | PRN
  Administered 2014-09-30 (×2): 50 ug via INTRAVENOUS

## 2014-09-30 MED ORDER — CEFOTETAN DISODIUM-DEXTROSE 2-2.08 GM-% IV SOLR
INTRAVENOUS | Status: AC
  Filled 2014-09-30: qty 50

## 2014-09-30 MED ORDER — LIDOCAINE HCL (CARDIAC) 20 MG/ML IV SOLN
INTRAVENOUS | Status: DC | PRN
  Administered 2014-09-30: 75 mg via INTRAVENOUS

## 2014-09-30 MED ORDER — FENTANYL CITRATE (PF) 100 MCG/2ML IJ SOLN
INTRAMUSCULAR | Status: AC
  Filled 2014-09-30: qty 2

## 2014-09-30 MED ORDER — LORAZEPAM 2 MG/ML IJ SOLN
1.0000 mg | Freq: Once | INTRAMUSCULAR | Status: AC
  Administered 2014-09-30: 1 mg via INTRAVENOUS
  Filled 2014-09-30: qty 1

## 2014-09-30 MED ORDER — HYDROMORPHONE HCL 1 MG/ML IJ SOLN
1.0000 mg | INTRAMUSCULAR | Status: DC | PRN
Start: 2014-09-30 — End: 2014-10-05
  Administered 2014-09-30 – 2014-10-01 (×5): 1 mg via INTRAVENOUS
  Filled 2014-09-30 (×4): qty 1

## 2014-09-30 MED ORDER — ONDANSETRON HCL 4 MG/2ML IJ SOLN
INTRAMUSCULAR | Status: AC
  Filled 2014-09-30: qty 2

## 2014-09-30 MED ORDER — MIDAZOLAM HCL 5 MG/5ML IJ SOLN
INTRAMUSCULAR | Status: DC | PRN
Start: 2014-09-30 — End: 2014-09-30
  Administered 2014-09-30 (×2): 1 mg via INTRAVENOUS

## 2014-09-30 MED ORDER — BUPIVACAINE-EPINEPHRINE (PF) 0.25% -1:200000 IJ SOLN
INTRAMUSCULAR | Status: AC
  Filled 2014-09-30: qty 30

## 2014-09-30 MED ORDER — ACETAMINOPHEN 10 MG/ML IV SOLN
1000.0000 mg | Freq: Once | INTRAVENOUS | Status: AC
  Administered 2014-09-30: 1000 mg via INTRAVENOUS
  Filled 2014-09-30: qty 100

## 2014-09-30 MED ORDER — DEXAMETHASONE SODIUM PHOSPHATE 10 MG/ML IJ SOLN
INTRAMUSCULAR | Status: AC
  Filled 2014-09-30: qty 1

## 2014-09-30 MED ORDER — ONDANSETRON HCL 4 MG/2ML IJ SOLN
INTRAMUSCULAR | Status: DC | PRN
  Administered 2014-09-30 (×2): 2 mg via INTRAVENOUS

## 2014-09-30 MED ORDER — LIDOCAINE HCL (CARDIAC) 20 MG/ML IV SOLN
INTRAVENOUS | Status: AC
  Filled 2014-09-30: qty 5

## 2014-09-30 MED ORDER — CISATRACURIUM BESYLATE 20 MG/10ML IV SOLN
INTRAVENOUS | Status: AC
Start: 2014-09-30 — End: 2014-09-30
  Filled 2014-09-30: qty 10

## 2014-09-30 MED ORDER — GLYCOPYRROLATE 0.2 MG/ML IJ SOLN
INTRAMUSCULAR | Status: DC | PRN
  Administered 2014-09-30: 0.4 mg via INTRAVENOUS

## 2014-09-30 MED ORDER — PROPOFOL 10 MG/ML IV BOLUS
INTRAVENOUS | Status: DC | PRN
  Administered 2014-09-30: 175 mg via INTRAVENOUS

## 2014-09-30 MED ORDER — SODIUM CHLORIDE 0.9 % IR SOLN
Status: DC | PRN
  Administered 2014-09-30: 2000 mL

## 2014-09-30 MED ORDER — NEOSTIGMINE METHYLSULFATE 10 MG/10ML IV SOLN
INTRAVENOUS | Status: DC | PRN
  Administered 2014-09-30: 3 mg via INTRAVENOUS

## 2014-09-30 MED ORDER — FENTANYL CITRATE (PF) 250 MCG/5ML IJ SOLN
INTRAMUSCULAR | Status: AC
  Filled 2014-09-30: qty 5

## 2014-09-30 MED ORDER — MEPERIDINE HCL 50 MG/ML IJ SOLN: 6.2500 mg | INTRAMUSCULAR | Status: DC | PRN

## 2014-09-30 MED ORDER — NEOSTIGMINE METHYLSULFATE 10 MG/10ML IV SOLN
INTRAVENOUS | Status: AC
  Filled 2014-09-30: qty 1

## 2014-09-30 MED ORDER — DEXTROSE 5 % IV SOLN
2.0000 g | INTRAVENOUS | Status: AC
  Administered 2014-09-30: 2 g via INTRAVENOUS

## 2014-09-30 MED ORDER — GLYCOPYRROLATE 0.2 MG/ML IJ SOLN
INTRAMUSCULAR | Status: AC
  Filled 2014-09-30: qty 3

## 2014-09-30 MED ORDER — CISATRACURIUM BESYLATE (PF) 10 MG/5ML IV SOLN
INTRAVENOUS | Status: DC | PRN
  Administered 2014-09-30: 2 mg via INTRAVENOUS
  Administered 2014-09-30: 10 mg via INTRAVENOUS

## 2014-09-30 MED ORDER — HYDROMORPHONE HCL 1 MG/ML IJ SOLN
INTRAMUSCULAR | Status: DC | PRN
  Administered 2014-09-30 (×2): 1 mg via INTRAVENOUS

## 2014-09-30 MED ORDER — DEXAMETHASONE SODIUM PHOSPHATE 10 MG/ML IJ SOLN
INTRAMUSCULAR | Status: DC | PRN
  Administered 2014-09-30: 10 mg via INTRAVENOUS

## 2014-09-30 MED ORDER — PROPOFOL 10 MG/ML IV BOLUS
INTRAVENOUS | Status: AC
  Filled 2014-09-30: qty 20

## 2014-09-30 MED ORDER — BUPIVACAINE-EPINEPHRINE 0.25% -1:200000 IJ SOLN
INTRAMUSCULAR | Status: DC | PRN
  Administered 2014-09-30: 5 mL

## 2014-09-30 MED ORDER — FENTANYL CITRATE (PF) 100 MCG/2ML IJ SOLN
INTRAMUSCULAR | Status: DC | PRN
  Administered 2014-09-30: 50 ug via INTRAVENOUS
  Administered 2014-09-30: 25 ug via INTRAVENOUS
  Administered 2014-09-30 (×3): 50 ug via INTRAVENOUS
  Administered 2014-09-30: 25 ug via INTRAVENOUS

## 2014-09-30 MED ORDER — LACTATED RINGERS IV SOLN
INTRAVENOUS | Status: DC
  Administered 2014-09-30: 1000 mL via INTRAVENOUS
  Administered 2014-09-30 (×2): via INTRAVENOUS

## 2014-09-30 SURGICAL SUPPLY — 53 items
APPLIER CLIP 5 13 M/L LIGAMAX5 (MISCELLANEOUS)
APPLIER CLIP ROT 10 11.4 M/L (STAPLE)
BENZOIN TINCTURE PRP APPL 2/3 (GAUZE/BANDAGES/DRESSINGS) ×2 IMPLANT
BLADE EXTENDED COATED 6.5IN (ELECTRODE) IMPLANT
BLADE HEX COATED 2.75 (ELECTRODE) ×2 IMPLANT
CABLE HIGH FREQUENCY MONO STRZ (ELECTRODE) ×2 IMPLANT
CELLS DAT CNTRL 66122 CELL SVR (MISCELLANEOUS) IMPLANT
CLIP APPLIE 5 13 M/L LIGAMAX5 (MISCELLANEOUS) IMPLANT
CLIP APPLIE ROT 10 11.4 M/L (STAPLE) IMPLANT
DECANTER SPIKE VIAL GLASS SM (MISCELLANEOUS) IMPLANT
DRAPE LAPAROSCOPIC ABDOMINAL (DRAPES) ×2 IMPLANT
DRSG PAD ABDOMINAL 8X10 ST (GAUZE/BANDAGES/DRESSINGS) ×2 IMPLANT
ELECT REM PT RETURN 9FT ADLT (ELECTROSURGICAL) ×2
ELECTRODE REM PT RTRN 9FT ADLT (ELECTROSURGICAL) ×1 IMPLANT
ENSEAL DEVICE STD TIP 35CM (ENDOMECHANICALS) IMPLANT
GAUZE SPONGE 4X4 12PLY STRL (GAUZE/BANDAGES/DRESSINGS) ×2 IMPLANT
GLOVE BIO SURGEON STRL SZ7.5 (GLOVE) ×4 IMPLANT
GLOVE BIOGEL PI IND STRL 7.0 (GLOVE) ×4 IMPLANT
GLOVE BIOGEL PI INDICATOR 7.0 (GLOVE) ×4
GOWN STRL REUS W/ TWL XL LVL3 (GOWN DISPOSABLE) IMPLANT
GOWN STRL REUS W/TWL LRG LVL3 (GOWN DISPOSABLE) IMPLANT
GOWN STRL REUS W/TWL XL LVL3 (GOWN DISPOSABLE) ×8 IMPLANT
LEGGING LITHOTOMY PAIR STRL (DRAPES) IMPLANT
LIGASURE IMPACT 36 18CM CVD LR (INSTRUMENTS) ×2 IMPLANT
LIQUID BAND (GAUZE/BANDAGES/DRESSINGS) IMPLANT
NS IRRIG 1000ML POUR BTL (IV SOLUTION) ×4 IMPLANT
PACK COLON (CUSTOM PROCEDURE TRAY) IMPLANT
PACK GENERAL/GYN (CUSTOM PROCEDURE TRAY) ×2 IMPLANT
RELOAD PROXIMATE 75MM BLUE (ENDOMECHANICALS) ×2 IMPLANT
RTRCTR WOUND ALEXIS 18CM MED (MISCELLANEOUS)
SCISSORS LAP 5X35 DISP (ENDOMECHANICALS) ×2 IMPLANT
SET IRRIG TUBING LAPAROSCOPIC (IRRIGATION / IRRIGATOR) IMPLANT
SHEARS HARMONIC ACE PLUS 36CM (ENDOMECHANICALS) IMPLANT
SOLUTION ANTI FOG 6CC (MISCELLANEOUS) ×2 IMPLANT
STAPLER PROXIMATE 75MM BLUE (STAPLE) ×2 IMPLANT
STAPLER VISISTAT 35W (STAPLE) IMPLANT
STRIP CLOSURE SKIN 1/2X4 (GAUZE/BANDAGES/DRESSINGS) ×2 IMPLANT
SUT PDS AB 1 CTX 36 (SUTURE) IMPLANT
SUT PROLENE 2 0 SH DA (SUTURE) ×2 IMPLANT
SUT SILK 2 0 (SUTURE)
SUT SILK 2 0 SH CR/8 (SUTURE) ×8 IMPLANT
SUT SILK 2-0 18XBRD TIE 12 (SUTURE) IMPLANT
SUT SILK 3 0 (SUTURE)
SUT SILK 3 0 SH CR/8 (SUTURE) IMPLANT
SUT SILK 3-0 18XBRD TIE 12 (SUTURE) IMPLANT
TRAY FOLEY W/METER SILVER 14FR (SET/KITS/TRAYS/PACK) IMPLANT
TROCAR BLADELESS OPT 5 75 (ENDOMECHANICALS) IMPLANT
TROCAR XCEL BLUNT TIP 100MML (ENDOMECHANICALS) IMPLANT
TROCAR XCEL NON-BLD 11X100MML (ENDOMECHANICALS) IMPLANT
TROCAR XCEL UNIV SLVE 11M 100M (ENDOMECHANICALS) IMPLANT
TUBING FILTER THERMOFLATOR (ELECTROSURGICAL) ×2 IMPLANT
TUBING INSUFFLATION 10FT LAP (TUBING) ×2 IMPLANT
YANKAUER SUCT BULB TIP NO VENT (SUCTIONS) ×2 IMPLANT

## 2014-09-30 NOTE — Progress Notes (Signed)
Patient came back to unit from PACU at around 1635,lethargic, pain level of 3/10,NGT intact to low cont suction per order,foley inplace, dsg to abdomen clean, dry and intact,. Will continue to monitor the patient.

## 2014-09-30 NOTE — Telephone Encounter (Signed)
-----   Message from Jerene Bears, MD sent at 09/30/2014 10:58 AM EDT ----- Keep both for now Thanks  ----- Message -----    From: Marlon Pel, RN    Sent: 09/30/2014  10:17 AM      To: Jerene Bears, MD  He already has follow up with you on 11/02/14. I also put him on for the 5/16 too early???  Which one should I keep (neither are an overbook)?    Evolette Pendell ----- Message -----    From: Jerene Bears, MD    Sent: 09/30/2014   9:38 AM      To: Marlon Pel, RN, Milus Banister, MD  I do agree.  Thanks Molinda Bailiff,  I can be overbooked to accommodate visit with me JMP  ----- Message -----    From: Milus Banister, MD    Sent: 09/30/2014   9:22 AM      To: Marlon Pel, RN, Jerene Bears, MD  Ulice Dash;  Copy of my note from today.  Want to make sure you agree with plan. THanks    Ina, He needs rov with Ulice Dash or extender or myself in 3-4 weeks.  Thanks    Assessment/Plan: 59 y.o. male with Crohn's related SB stricture, SBO with retained capsule at the site  I agree with x-lap today.  Hopefully he will be recovered from surgery well enough to still make his trip to [place] in 3-4 weeks.  He should be restarted on entocort 28m once daily as soon as he is reliably taking PO after surgery.  He will be set up for follow up at [facility] in 3-4 weeks (just prior to his trip) to see how he has recovered. From there, will likely be scheduled to begin biologics.    Please call with any further questions or concerns.

## 2014-09-30 NOTE — Progress Notes (Signed)
Subjective: Pt with con't SBO  Objective: Vital signs in last 24 hours: Temp:  [98.5 F (36.9 C)] 98.5 F (36.9 C) (05/03 1100) Pulse Rate:  [68-70] 70 (05/03 1100) Resp:  [18] 18 (05/03 1100) BP: (122-139)/(62-75) 139/75 mmHg (05/03 1100) SpO2:  [93 %] 93 % (05/03 1100) Weight:  [85.276 kg (188 lb)] 85.276 kg (188 lb) (05/03 1100) Last BM Date: 09/28/14  Intake/Output from previous day: 05/03 0701 - 05/04 0700 In: 1711.7 [I.V.:1711.7] Out: 1100 [Emesis/NG output:1100] Intake/Output this shift:    General appearance: alert and cooperative GI: soft min distended  Lab Results:   Recent Labs  09/29/14 0809 09/30/14 0429  WBC 9.0 8.3  HGB 16.5 14.3  HCT 49.1 43.4  PLT 208 201   BMET  Recent Labs  09/29/14 0809 09/30/14 0429  NA 137 141  K 4.1 4.1  CL 105 108  CO2 27 28  GLUCOSE 113* 116*  BUN 17 22*  CREATININE 0.97 1.01  CALCIUM 9.1 8.3*   PT/INR No results for input(s): LABPROT, INR in the last 72 hours. ABG No results for input(s): PHART, HCO3 in the last 72 hours.  Invalid input(s): PCO2, PO2  Studies/Results: Abd 1 View (kub)  09/29/2014   CLINICAL DATA:  Small-bowel obstruction  EXAM: ABDOMEN - 1 VIEW  COMPARISON:  CT from earlier in the same day.  FINDINGS: There are changes consistent with small bowel obstruction similar to that seen on the recent CT examination secondary to an endoscopy capsule lodged within an area of narrowing in the mid abdomen. More distal small bowel and colon is decompressed. Contrast material distends the urinary bladder. No bony abnormality is seen.  IMPRESSION: Changes consistent with small bowel obstruction secondary to an endoscopy capsule in an area of small bowel narrowing. The findings are similar to that seen on recent CT examination.   Electronically Signed   By: Inez Catalina M.D.   On: 09/29/2014 16:33   Ct Abdomen Pelvis W Contrast  09/29/2014   CLINICAL DATA:  Abdominal pain, nausea and bloating, endoscopy  capsule 6 weeks ago, did not expelled the capsule.  EXAM: CT ABDOMEN AND PELVIS WITH CONTRAST  TECHNIQUE: Multidetector CT imaging of the abdomen and pelvis was performed using the standard protocol following bolus administration of intravenous contrast.  CONTRAST:  100 cc Omnipaque  COMPARISON:  Abdominal x-ray 09/18/2014  FINDINGS: Lung bases are unremarkable. Sagittal images of the spine shows mild degenerative changes lower thoracic spine. Enhanced liver shows no biliary ductal dilatation. Multiple hepatic cysts are noted. The largest cyst in right hepatic lobe measures 4.3 cm. The largest cyst in left hepatic lobe measures 2.5 cm. No calcified gallstones are noted within gallbladder. The pancreas, spleen and adrenal glands are unremarkable. Kidneys are symmetrical in size and enhancement. No hydronephrosis or hydroureter. There is a cyst in midpole anterior aspect of the left kidney measures 2.2 cm. Cyst in midpole of the left kidney posterior aspect measures 1.2 cm.  Delayed renal images shows bilateral renal symmetrical excretion. Bilateral visualized proximal ureter is unremarkable. There is oral contrast material within stomach and proximal small bowel. There are fluid distended small bowel loops in mid and lower abdomen. Significant fluid distended small bowel loops in mid pelvis with air-fluid levels. Axial image 66 there is markedly distended small bowel loop in mid lower abdomen up to 5 cm. Fecal like material noted in a segment of small bowel which measures about 9 cm length. The obstruction is at the level of endoscopy  capsule which is visualized in axial image 66 measures 1.6 cm. Beyond the capsule the small bowel is small caliber. The terminal ileum is decompressed. No any contrast material is noted within distal small bowel or within right colon. Prostate gland and seminal vesicles are unremarkable. There is tiny amount of mesenteric fluid adjacent to distended small bowel loop in axial image 55.  Small nonspecific mesenteric lymph nodes are noted in right lower quadrant mesentery.  The patient is status post appendectomy. There is no pericecal inflammation. No inguinal adenopathy.  IMPRESSION: 1. There is small bowel obstruction with distended small bowel loops and air-fluid levels in mid lower abdomen and pelvis. The transition point in caliber is at the level of ingested capsule best seen in axial image 66. There is distension of proximal small bowel with fecal like material just above the capsule. The distal small bowel is decompressed small caliber. 2. No pericecal inflammation. The patient is status post appendectomy. 3. No hydronephrosis or hydroureter. 4. Hepatic cysts are noted. No intrahepatic biliary ductal dilatation.  These results were called by telephone at the time of interpretation on 09/29/2014 at 10:30 am to Dr. Joseph Berkshire , who verbally acknowledged these results.   Electronically Signed   By: Lahoma Crocker M.D.   On: 09/29/2014 10:33    Anti-infectives: Anti-infectives    None      Assessment/Plan: SBO due to FB For OR today for dx lap, and possible SB resection. I d/w the pt the risks and benefits of the procedure to include but not limited to: infection, bleeding, damage to surrounding structure, possible bowel resection, possible anastmotic leak or dehiscense.  The patient voices understanding and wishes to proceed.   LOS: 1 day    Rosario Jacks., Anne Hahn 09/30/2014

## 2014-09-30 NOTE — Anesthesia Procedure Notes (Signed)
Procedure Name: Intubation Date/Time: 09/30/2014 1:06 PM Performed by: Ofilia Neas Pre-anesthesia Checklist: Patient identified, Emergency Drugs available, Suction available, Patient being monitored and Timeout performed Patient Re-evaluated:Patient Re-evaluated prior to inductionOxygen Delivery Method: Circle system utilized Preoxygenation: Pre-oxygenation with 100% oxygen Intubation Type: Rapid sequence and Cricoid Pressure applied Laryngoscope Size: Mac and 4 Grade View: Grade III Tube type: Oral Tube size: 7.5 mm Number of attempts: 2 Airway Equipment and Method: Stylet Placement Confirmation: breath sounds checked- equal and bilateral and positive ETCO2 Secured at: 21 cm Tube secured with: Tape Dental Injury: Teeth and Oropharynx as per pre-operative assessment  Difficulty Due To: Difficult Airway- due to anterior larynx, Difficult Airway- due to limited oral opening, Difficult Airway- due to dentition, Difficult Airway- due to reduced neck mobility, Difficult Airway- due to large tongue and Difficulty was anticipated Future Recommendations: Recommend- induction with short-acting agent, and alternative techniques readily available

## 2014-09-30 NOTE — Transfer of Care (Signed)
Immediate Anesthesia Transfer of Care Note  Patient: Terry Kerr  Procedure(s) Performed: Procedure(s): DIAGNOSTIC LAPAROSCOPY WITH EXPLORATORY LAP WITH SMALL BOWEL RESECTION AND ANASTAMOSIS (N/A)  Patient Location: PACU  Anesthesia Type:General  Level of Consciousness: awake, alert , oriented and patient cooperative  Airway & Oxygen Therapy: Patient Spontanous Breathing and Patient connected to face mask oxygen  Post-op Assessment: Report given to RN, Post -op Vital signs reviewed and stable and Patient moving all extremities X 4  Post vital signs: stable  Last Vitals:  Filed Vitals:   09/30/14 1518  BP: 136/83  Pulse: 70  Temp: 36.4 C  Resp: 12    Complications: No apparent anesthesia complications

## 2014-09-30 NOTE — Op Note (Signed)
09/30/2014  3:04 PM  PATIENT:  Terry Kerr  59 y.o. male  PRE-OPERATIVE DIAGNOSIS:  small bowel obstruction  POST-OPERATIVE DIAGNOSIS:  small bowel obstruction caused by SB stricture, creeping fat on multiple sections of the SB.  No further strictures seen.  PROCEDURE:  Procedure(s): DIAGNOSTIC LAPAROSCOPY WITH EXPLORATORY LAP WITH SMALL BOWEL RESECTION AND ANASTAMOSIS (N/A)  SURGEON:  Surgeon(s) and Role:    * Ralene Ok, MD - Primary  ASSISTANTS: Servando Snare, RNFA   ANESTHESIA:   general  EBL: 10cc Total I/O In: 1000 [I.V.:1000] Out: 68 [Urine:100]  BLOOD ADMINISTERED:none  DRAINS: none   LOCAL MEDICATIONS USED:  BUPIVICAINE   SPECIMEN:  Source of Specimen:  Small Bowel resection with stricture x2  DISPOSITION OF SPECIMEN:  PATHOLOGY  COUNTS:  YES  TOURNIQUET:  * No tourniquets in log *  DICTATION: .Dragon Dictation After the patient was consented he was taken back to the operating room and placed in the supine position with bilateral SCDs in place. He was prepped and draped in the usual sterile fashion. After appropriate antibiotic from a timeout was called all facts verified.  At this time a Veress needle technique was used to the left subcostal margin to inspect abdomen to 15 mmHg. Subsequently to this a 5 mm trocar and camera then introduced into the abdomen. There was no injury to any intra-abdominal organs. A 5 mm trocar placed in the left lower quadrant under direct visualization as well as at the umbilicus. At this time I proceeded to run the bowel from the ligament of Treitz proximally. There did appear to be creeping fat along the segments of the distal ileum, however no strictures or acute inflammation was seen. The terminal ileum. At this time while running the bowel in the area of the proximal ileum that appeared to be a clear transition point with dilated proximal bowel and decompressed distal bowel. At this transition point there was 2 areas of  stricture as well as creeping fat.   At this time the bowel was grasped. A midline incision was made just superior to the umbilicus. Electrocautery was used to maintain hemostasis and dissection was taken down to the anterior fascia. The bowel was brought up into the incision site. The area of stricture disease bowel encompassed approximately 26 cm of bowel. This was the amount of bowel excised.  At this time we proceeded to use fluoroscopy to try and visualize the pinpoint the foreign body. The foreign body did appear to be in the sigmoid/rectum. However secondary to the fact that this area of stricture was created a small bowel obstruction we proceeded to resect this portion of bowel.  2 GIA staplers were then used to transect the bowel. Proximally this was done on dilated bowel habit was healthy. Distally there was decompressed bowel as well as been healthy. The mesentery was ligated using the LigaSure device. Bleeding vessels were ligated with 2-0 silk in a figure-of-eight fashion.    Secondary to the history of steroids as well as discrepancy in size of the proximal distal bowel. I proceeded to hand sew the bowel in a 2 layer fashion. 2-0 silk's were used as a posterior layer. 2-0 Vicryl's were used 2 and an inner running fashion. Again the external layer was reapproximated using a 2-0 silk's in interrupted fashion. The anastomosis was clearly patent. The mesentery of this time was reapproximated using 2-0 silk's in a figure-of-eight fashion. The anastomosis was then placed back into the abdomen.  The fascia was reapproximated  using figure-of-eight 0 Novafils.  The skin was left open and irrigated with sterile saline. Abdomen was again insufflated proceeded to visualize the abdominal cavity. There appeared to be no further areas of concern at this time. Omentum was brought over the fresh anastomosis. All the insufflation was evacuated. Trochars were removed. The midline skin was loosely reapproximated  using skin staples. The trocar sites were approximated using skin staples.  The patient tolerated procedure well was taken to the recovery room stable condition.  PLAN OF CARE: Admit to inpatient   PATIENT DISPOSITION:  PACU - hemodynamically stable.   Delay start of Pharmacological VTE agent (>24hrs) due to surgical blood loss or risk of bleeding: not applicable

## 2014-09-30 NOTE — Progress Notes (Signed)
[facility] PROGRESS NOTE  Terry Kerr MBW:[id number] DOB: [birth year] DOA: 09/29/2014 PCP: Marton Redwood, MD  Assessment/Plan: #1 small bowel obstruction secondary to small bowel stricture secondary to Crohn's disease with retained capsule Patient status post diagnostic laparoscopy with exploratory laparoscopy with small bowel resection and anastomosis per Dr.Ramirez on today. Once patient is started on a diet and tolerating enteric-coated need to be resumed per GI recommendations. Continue hydration, supportive care. General surgery and GI following.  #2 Crohn's disease Entocort to be resumed once patient has been started and tolerating a diet. Per GI.  #3 prophylaxis Lovenox for DVT prophylaxis.  Code Status: full Family Communication: updated patient. Family at bedside. Disposition Plan: remaining patient.   Consultants:  Gen. Surgery: Dr. Rosendo Gros 09/29/2014  Gastroenterology: Dr. Ardis Hughs 09/29/2014  Procedures:  Abdominal x-rays 09/29/2014,09/30/2014  Diagnostic laparoscopy with exploratory laparoscopy with small bowel resection and anastomosis per Dr. Rosendo Gros 09/30/2014  Antibiotics:  none  HPI/Subjective: Patient complaining of some abdominal discomfort. Patient denies any nausea or emesis. Patient is postop.  Objective: Filed Vitals:   09/30/14 1518  BP: 136/83  Pulse: 70  Temp: 97.5 F (36.4 C)  Resp: 12    Intake/Output Summary (Last 24 hours) at 09/30/14 1523 Last data filed at 09/30/14 1510  Gross per 24 hour  Intake   3500 ml  Output    500 ml  Net   3000 ml   Filed Weights   09/29/14 1100  Weight: 85.276 kg (188 lb)    Exam:   General:  NAD  Cardiovascular: RRR  Respiratory: CTAB anterior lung fields  Abdomen: soft,hypoactive bowel sounds, bandage over incision site,  Musculoskeletal: no clubbing cyanosis or edema.  Data Reviewed: Basic Metabolic Panel:  Recent Labs Lab 09/29/14 0809 09/30/14 0429  NA 137 141  K 4.1  4.1  CL 105 108  CO2 27 28  GLUCOSE 113* 116*  BUN 17 22*  CREATININE 0.97 1.01  CALCIUM 9.1 8.3*  MG 2.1 2.0   Liver Function Tests:  Recent Labs Lab 09/29/14 0809 09/30/14 0429  AST 14* 14*  ALT 18 14*  ALKPHOS 65 50  BILITOT 1.6* 0.8  PROT 7.2 5.9*  ALBUMIN 4.1 3.3*    Recent Labs Lab 09/29/14 0809  LIPASE 23   No results for input(s): AMMONIA in the last 168 hours. CBC:  Recent Labs Lab 09/29/14 0809 09/30/14 0429  WBC 9.0 8.3  NEUTROABS 7.7  --   HGB 16.5 14.3  HCT 49.1 43.4  MCV 90.6 91.8  PLT 208 201   Cardiac Enzymes: No results for input(s): CKTOTAL, CKMB, CKMBINDEX, TROPONINI in the last 168 hours. BNP (last 3 results) No results for input(s): BNP in the last 8760 hours.  ProBNP (last 3 results) No results for input(s): PROBNP in the last 8760 hours.  CBG: No results for input(s): GLUCAP in the last 168 hours.  Recent Results (from the past 240 hour(s))  Surgical pcr screen     Status: None   Collection Time: 09/30/14 10:01 AM  Result Value Ref Range Status   MRSA, PCR NEGATIVE NEGATIVE Final   Staphylococcus aureus NEGATIVE NEGATIVE Final    Comment:        The Xpert SA Assay (FDA approved for NASAL specimens in patients over 10 years of age), is one component of a comprehensive surveillance program.  Test performance has been validated by [facility] for patients greater than or equal to 11 year old. It is not intended to diagnose infection nor to  guide or monitor treatment.      Studies: Abd 1 View (kub)  09/29/2014   CLINICAL DATA:  Small-bowel obstruction  EXAM: ABDOMEN - 1 VIEW  COMPARISON:  CT from earlier in the same day.  FINDINGS: There are changes consistent with small bowel obstruction similar to that seen on the recent CT examination secondary to an endoscopy capsule lodged within an area of narrowing in the mid abdomen. More distal small bowel and colon is decompressed. Contrast material distends the urinary bladder.  No bony abnormality is seen.  IMPRESSION: Changes consistent with small bowel obstruction secondary to an endoscopy capsule in an area of small bowel narrowing. The findings are similar to that seen on recent CT examination.   Electronically Signed   By: Inez Catalina M.D.   On: 09/29/2014 16:33   Ct Abdomen Pelvis W Contrast  09/29/2014   CLINICAL DATA:  Abdominal pain, nausea and bloating, endoscopy capsule 6 weeks ago, did not expelled the capsule.  EXAM: CT ABDOMEN AND PELVIS WITH CONTRAST  TECHNIQUE: Multidetector CT imaging of the abdomen and pelvis was performed using the standard protocol following bolus administration of intravenous contrast.  CONTRAST:  100 cc Omnipaque  COMPARISON:  Abdominal x-ray 09/18/2014  FINDINGS: Lung bases are unremarkable. Sagittal images of the spine shows mild degenerative changes lower thoracic spine. Enhanced liver shows no biliary ductal dilatation. Multiple hepatic cysts are noted. The largest cyst in right hepatic lobe measures 4.3 cm. The largest cyst in left hepatic lobe measures 2.5 cm. No calcified gallstones are noted within gallbladder. The pancreas, spleen and adrenal glands are unremarkable. Kidneys are symmetrical in size and enhancement. No hydronephrosis or hydroureter. There is a cyst in midpole anterior aspect of the left kidney measures 2.2 cm. Cyst in midpole of the left kidney posterior aspect measures 1.2 cm.  Delayed renal images shows bilateral renal symmetrical excretion. Bilateral visualized proximal ureter is unremarkable. There is oral contrast material within stomach and proximal small bowel. There are fluid distended small bowel loops in mid and lower abdomen. Significant fluid distended small bowel loops in mid pelvis with air-fluid levels. Axial image 66 there is markedly distended small bowel loop in mid lower abdomen up to 5 cm. Fecal like material noted in a segment of small bowel which measures about 9 cm length. The obstruction is at the  level of endoscopy capsule which is visualized in axial image 66 measures 1.6 cm. Beyond the capsule the small bowel is small caliber. The terminal ileum is decompressed. No any contrast material is noted within distal small bowel or within right colon. Prostate gland and seminal vesicles are unremarkable. There is tiny amount of mesenteric fluid adjacent to distended small bowel loop in axial image 55. Small nonspecific mesenteric lymph nodes are noted in right lower quadrant mesentery.  The patient is status post appendectomy. There is no pericecal inflammation. No inguinal adenopathy.  IMPRESSION: 1. There is small bowel obstruction with distended small bowel loops and air-fluid levels in mid lower abdomen and pelvis. The transition point in caliber is at the level of ingested capsule best seen in axial image 66. There is distension of proximal small bowel with fecal like material just above the capsule. The distal small bowel is decompressed small caliber. 2. No pericecal inflammation. The patient is status post appendectomy. 3. No hydronephrosis or hydroureter. 4. Hepatic cysts are noted. No intrahepatic biliary ductal dilatation.  These results were called by telephone at the time of interpretation on 09/29/2014 at  10:30 am to Dr. Joseph Berkshire , who verbally acknowledged these results.   Electronically Signed   By: Lahoma Crocker M.D.   On: 09/29/2014 10:33   Dg C-arm 1-60 Min-no Report  09/30/2014   CLINICAL DATA: surgery   C-ARM 1-60 MINUTES  Fluoroscopy was utilized by the requesting physician.  No radiographic  interpretation.     Scheduled Meds: . [MAR Hold] antiseptic oral rinse  7 mL Mouth Rinse BID  . [MAR Hold] enoxaparin (LOVENOX) injection  40 mg Subcutaneous Q24H   Continuous Infusions: . sodium chloride Stopped (09/30/14 1231)  . lactated ringers 1,000 mL (09/30/14 1222)    Principal Problem:   SBO (small bowel obstruction) Active Problems:   HYPERCHOLESTEROLEMIA   Regional  enteritis (Crohn's disease)   Diverticulosis    Time spent: [place] MD [facility] Pager [phone]. If 7PM-7AM, please contact night-coverage at [email or web address], password [facility] 09/30/2014, 3:23 PM  LOS: 1 day

## 2014-09-30 NOTE — Anesthesia Preprocedure Evaluation (Addendum)
Anesthesia Evaluation  Patient identified by MRN, date of birth, ID band Patient awake    Reviewed: Allergy & Precautions, NPO status , Patient's Chart, lab work & pertinent test results, reviewed documented beta blocker date and time   Airway Mallampati: II   Neck ROM: Full    Dental  (+) Dental Advisory Given, Teeth Intact   Pulmonary neg pulmonary ROS,  breath sounds clear to auscultation        Cardiovascular negative cardio ROS  Rhythm:Regular     Neuro/Psych negative neurological ROS  negative psych ROS   GI/Hepatic Neg liver ROS, Chrons   Endo/Other  negative endocrine ROS  Renal/GU negative Renal ROS     Musculoskeletal   Abdominal (+)  Abdomen: soft.    Peds  Hematology 14/43   Anesthesia Other Findings   Reproductive/Obstetrics                            Anesthesia Physical Anesthesia Plan  ASA: II and emergent  Anesthesia Plan: General   Post-op Pain Management:    Induction: Intravenous, Rapid sequence and Cricoid pressure planned  Airway Management Planned: Oral ETT  Additional Equipment:   Intra-op Plan:   Post-operative Plan: Extubation in OR  Informed Consent: I have reviewed the patients History and Physical, chart, labs and discussed the procedure including the risks, benefits and alternatives for the proposed anesthesia with the patient or authorized representative who has indicated his/her understanding and acceptance.     Plan Discussed with:   Anesthesia Plan Comments: (Multimodal pain rx)        Anesthesia Quick Evaluation

## 2014-09-30 NOTE — Anesthesia Postprocedure Evaluation (Signed)
  Anesthesia Post-op Note  Patient: Terry Kerr  Procedure(s) Performed: Procedure(s): DIAGNOSTIC LAPAROSCOPY WITH EXPLORATORY LAP WITH SMALL BOWEL RESECTION AND ANASTAMOSIS (N/A)  Patient Location: PACU  Anesthesia Type:General  Level of Consciousness: awake  Airway and Oxygen Therapy: Patient Spontanous Breathing and Patient connected to nasal cannula oxygen  Post-op Pain: mild  Post-op Assessment: Post-op Vital signs reviewed, Patient's Cardiovascular Status Stable, Respiratory Function Stable, RESPIRATORY FUNCTION UNSTABLE and No signs of Nausea or vomiting  Post-op Vital Signs: Reviewed and stable  Last Vitals:  Filed Vitals:   09/30/14 1201  BP: 128/74  Pulse: 72  Temp: 36.7 C  Resp: 18    Complications: No apparent anesthesia complications

## 2014-09-30 NOTE — Progress Notes (Signed)
[facility] Progress Note   Subjective: He still has SBO, planning for x-lap today.   Objective: Vital signs in last 24 hours: Temp:  [98.5 F (36.9 C)] 98.5 F (36.9 C) (05/03 1100) Pulse Rate:  [68-70] 70 (05/03 1100) Resp:  [18] 18 (05/03 1100) BP: (122-139)/(62-75) 139/75 mmHg (05/03 1100) SpO2:  [93 %] 93 % (05/03 1100) Weight:  [188 lb (85.276 kg)] 188 lb (85.276 kg) (05/03 1100) Last BM Date: 09/28/14 General: alert and oriented times 3 Heart: regular rate and rythm Abdomen: soft, non-tender, non-distended, normal bowel sounds   Lab Results:  Recent Labs  09/29/14 0809 09/30/14 0429  WBC 9.0 8.3  HGB 16.5 14.3  PLT 208 201  MCV 90.6 91.8    Recent Labs  09/29/14 0809 09/30/14 0429  NA 137 141  K 4.1 4.1  CL 105 108  CO2 27 28  GLUCOSE 113* 116*  BUN 17 22*  CREATININE 0.97 1.01  CALCIUM 9.1 8.3*    Recent Labs  09/29/14 0809 09/30/14 0429  PROT 7.2 5.9*  ALBUMIN 4.1 3.3*  AST 14* 14*  ALT 18 14*  ALKPHOS 65 50  BILITOT 1.6* 0.8   Assessment/Plan: 59 y.o. male with Crohn's related SB stricture, SBO with retained capsule at the site  I agree with x-lap today.  Hopefully he will be recovered from surgery well enough to still make his trip to [place] in 3-4 weeks.  He should be restarted on entocort 75m once daily as soon as he is reliably taking PO after surgery.  [facility] (just prior to his trip) to see how he has recovered. From there, will likely be scheduled to begin biologics.    Please call with any further questions or concerns.   JMilus Banister MD  09/30/2014, 9:14 AM [facility] Pager [phone]

## 2014-09-30 NOTE — Telephone Encounter (Signed)
Patient is notified of the appt by mail

## 2014-09-30 NOTE — Progress Notes (Signed)
NGT came out after patient sneezed hard,paged Dr. Rosendo Gros and said leave the NGT out.

## 2014-10-01 ENCOUNTER — Inpatient Hospital Stay (HOSPITAL_COMMUNITY): Payer: Federal, State, Local not specified - PPO

## 2014-10-01 ENCOUNTER — Encounter (HOSPITAL_COMMUNITY): Payer: Self-pay | Admitting: General Surgery

## 2014-10-01 LAB — BASIC METABOLIC PANEL
Anion gap: 4 — ABNORMAL LOW (ref 5–15)
BUN: 17 mg/dL (ref 6–20)
CALCIUM: 8 mg/dL — AB (ref 8.9–10.3)
CO2: 29 mmol/L (ref 22–32)
CREATININE: 0.92 mg/dL (ref 0.61–1.24)
Chloride: 106 mmol/L (ref 101–111)
GFR calc Af Amer: >60 mL/min (ref >60)
GLUCOSE: 107 mg/dL — AB (ref 70–99)
Potassium: 4.6 mmol/L (ref 3.5–5.1)
Sodium: 139 mmol/L (ref 135–145)

## 2014-10-01 LAB — QUANTIFERON IN TUBE
QFT TB AG MINUS NIL VALUE: 0.02 [IU]/mL
QUANTIFERON MITOGEN VALUE: 0.14 [IU]/mL
QUANTIFERON TB AG VALUE: 0.07 IU/mL
QUANTIFERON TB GOLD: UNDETERMINED
Quantiferon Nil Value: 0.05 IU/mL

## 2014-10-01 LAB — CBC
HEMATOCRIT: 39.7 % (ref 39.0–52.0)
HEMOGLOBIN: 12.7 g/dL — AB (ref 13.0–17.0)
MCH: 29.6 pg (ref 26.0–34.0)
MCHC: 32 g/dL (ref 30.0–36.0)
MCV: 92.5 fL (ref 78.0–100.0)
Platelets: 160 10*3/uL (ref 150–400)
RBC: 4.29 MIL/uL (ref 4.22–5.81)
RDW: 13.2 % (ref 11.5–15.5)
WBC: 6.8 10*3/uL (ref 4.0–10.5)

## 2014-10-01 LAB — QUANTIFERON TB GOLD ASSAY (BLOOD)

## 2014-10-01 LAB — MAGNESIUM: MAGNESIUM: 2.1 mg/dL (ref 1.7–2.4)

## 2014-10-01 MED ORDER — PANTOPRAZOLE SODIUM 40 MG IV SOLR
40.0000 mg | Freq: Two times a day (BID) | INTRAVENOUS | Status: DC
  Administered 2014-10-01 – 2014-10-05 (×9): 40 mg via INTRAVENOUS
  Filled 2014-10-01 (×10): qty 40

## 2014-10-01 NOTE — Progress Notes (Signed)
1 Day Post-Op  Subjective: Pretty sore, drainage from NG is mostly clear and a little reddish in color.  The sump was not working fixed NG, Dressing changed and he had some bleeding from abdominal wall.    Objective: Vital signs in last 24 hours: Temp:  [97.5 F (36.4 C)-98.5 F (36.9 C)] 98 F (36.7 C) (05/05 1610) Pulse Rate:  [56-72] 70 (05/05 0633) Resp:  [12-18] 16 (05/05 0633) BP: (111-140)/(64-83) 124/64 mmHg (05/05 0633) SpO2:  [93 %-100 %] 99 % (05/05 9604) Last BM Date: 09/28/14   1000 from the NG Afebrile, VSS Lab OK Intake/Output from previous day: 05/04 0701 - 05/05 0700 In: 4630 [I.V.:4630] Out: 1830 [Urine:830; Emesis/NG output:1000] Intake/Output this shift:    General appearance: alert, cooperative and no distress Resp: clear to auscultation bilaterally GI: soft, tender, he had some bleeding from the wound with removal of dressing.  Port sites look fine.  No BS,   Lab Results:   Recent Labs  09/30/14 0429 10/01/14 0437  WBC 8.3 6.8  HGB 14.3 12.7*  HCT 43.4 39.7  PLT 201 160    BMET  Recent Labs  09/30/14 0429 10/01/14 0437  NA 141 139  K 4.1 4.6  CL 108 106  CO2 28 29  GLUCOSE 116* 107*  BUN 22* 17  CREATININE 1.01 0.92  CALCIUM 8.3* 8.0*   PT/INR No results for input(s): LABPROT, INR in the last 72 hours.   Recent Labs Lab 09/29/14 0809 09/30/14 0429  AST 14* 14*  ALT 18 14*  ALKPHOS 65 50  BILITOT 1.6* 0.8  PROT 7.2 5.9*  ALBUMIN 4.1 3.3*     Lipase     Component Value Date/Time   LIPASE 23 09/29/2014 0809     Studies/Results: Abd 1 View (kub)  09/29/2014   CLINICAL DATA:  Small-bowel obstruction  EXAM: ABDOMEN - 1 VIEW  COMPARISON:  CT from earlier in the same day.  FINDINGS: There are changes consistent with small bowel obstruction similar to that seen on the recent CT examination secondary to an endoscopy capsule lodged within an area of narrowing in the mid abdomen. More distal small bowel and colon is  decompressed. Contrast material distends the urinary bladder. No bony abnormality is seen.  IMPRESSION: Changes consistent with small bowel obstruction secondary to an endoscopy capsule in an area of small bowel narrowing. The findings are similar to that seen on recent CT examination.   Electronically Signed   By: Inez Catalina M.D.   On: 09/29/2014 16:33   Ct Abdomen Pelvis W Contrast  09/29/2014   CLINICAL DATA:  Abdominal pain, nausea and bloating, endoscopy capsule 6 weeks ago, did not expelled the capsule.  EXAM: CT ABDOMEN AND PELVIS WITH CONTRAST  TECHNIQUE: Multidetector CT imaging of the abdomen and pelvis was performed using the standard protocol following bolus administration of intravenous contrast.  CONTRAST:  100 cc Omnipaque  COMPARISON:  Abdominal x-ray 09/18/2014  FINDINGS: Lung bases are unremarkable. Sagittal images of the spine shows mild degenerative changes lower thoracic spine. Enhanced liver shows no biliary ductal dilatation. Multiple hepatic cysts are noted. The largest cyst in right hepatic lobe measures 4.3 cm. The largest cyst in left hepatic lobe measures 2.5 cm. No calcified gallstones are noted within gallbladder. The pancreas, spleen and adrenal glands are unremarkable. Kidneys are symmetrical in size and enhancement. No hydronephrosis or hydroureter. There is a cyst in midpole anterior aspect of the left kidney measures 2.2 cm. Cyst in midpole of  the left kidney posterior aspect measures 1.2 cm.  Delayed renal images shows bilateral renal symmetrical excretion. Bilateral visualized proximal ureter is unremarkable. There is oral contrast material within stomach and proximal small bowel. There are fluid distended small bowel loops in mid and lower abdomen. Significant fluid distended small bowel loops in mid pelvis with air-fluid levels. Axial image 66 there is markedly distended small bowel loop in mid lower abdomen up to 5 cm. Fecal like material noted in a segment of small bowel  which measures about 9 cm length. The obstruction is at the level of endoscopy capsule which is visualized in axial image 66 measures 1.6 cm. Beyond the capsule the small bowel is small caliber. The terminal ileum is decompressed. No any contrast material is noted within distal small bowel or within right colon. Prostate gland and seminal vesicles are unremarkable. There is tiny amount of mesenteric fluid adjacent to distended small bowel loop in axial image 55. Small nonspecific mesenteric lymph nodes are noted in right lower quadrant mesentery.  The patient is status post appendectomy. There is no pericecal inflammation. No inguinal adenopathy.  IMPRESSION: 1. There is small bowel obstruction with distended small bowel loops and air-fluid levels in mid lower abdomen and pelvis. The transition point in caliber is at the level of ingested capsule best seen in axial image 66. There is distension of proximal small bowel with fecal like material just above the capsule. The distal small bowel is decompressed small caliber. 2. No pericecal inflammation. The patient is status post appendectomy. 3. No hydronephrosis or hydroureter. 4. Hepatic cysts are noted. No intrahepatic biliary ductal dilatation.  These results were called by telephone at the time of interpretation on 09/29/2014 at 10:30 am to Dr. Joseph Berkshire , who verbally acknowledged these results.   Electronically Signed   By: Lahoma Crocker M.D.   On: 09/29/2014 10:33   Dg Abd Portable 1v  10/01/2014   CLINICAL DATA:  Small bowel obstruction  EXAM: PORTABLE ABDOMEN - 1 VIEW  COMPARISON:  09/30/2014  FINDINGS: The nasogastric tube tip is in the stomach. There is enteric contrast throughout the colon. There is mild mid abdominal small bowel dilatation which is unchanged from 09/30/2014 but substantially improved from 09/29/2014. There is no free intraperitoneal air.  IMPRESSION: Mild residual mid abdominal small bowel dilatation, unchanged.   Electronically  Signed   By: Andreas Newport M.D.   On: 10/01/2014 05:38   Dg Abd Portable 1v  09/30/2014   CLINICAL DATA:  Status small bowel resection for obstruction.  EXAM: PORTABLE ABDOMEN - 1 VIEW  COMPARISON:  None.  FINDINGS: Small bowel dilatation seen on the comparison examination has resolved. Contrast material from the patient's CT scan is now seen in a decompressed colon. Surgical staples in the left upper quadrant and midline of the low abdomen are noted. Endoscopy capsule is again seen and projects slightly lower in the pelvis than on the prior study.  IMPRESSION: Small bowel obstruction appears resolved.  NG tube in good position.  Endoscopy capsule appears to have progressed through small bowel somewhat.   Electronically Signed   By: Inge Rise M.D.   On: 09/30/2014 16:07   Dg C-arm 1-60 Min-no Report  09/30/2014   CLINICAL DATA: surgery   C-ARM 1-60 MINUTES  Fluoroscopy was utilized by the requesting physician.  No radiographic  interpretation.     Medications: . antiseptic oral rinse  7 mL Mouth Rinse BID  . enoxaparin (LOVENOX) injection  40 mg  Subcutaneous Q24H    Assessment/Plan Small bowel obstruction caused by SB stricture, creeping fat on multiple sections of the SB. No further strictures seen. DIAGNOSTIC LAPAROSCOPY WITH EXPLORATORY LAP WITH SMALL BOWEL RESECTION AND ANASTAMOSIS, 09/30/14, Dr. Ralene Ok. SBO with endoscopy capsule obstruction at the point of transition Crohn's disease since 1980 Hx of diverticulitis Hyperlipidemia DVT:  Lovenox/SCD Antibiotics:  None Plan:  OOB, continue NG, dressing changes, add PPI, and mobilize.        LOS: 2 days    Terry Kerr 10/01/2014

## 2014-10-01 NOTE — Progress Notes (Signed)
[facility] PROGRESS NOTE  Terry Kerr WUJ:[id number] DOB: July 15, 1955 DOA: 09/29/2014 PCP: Marton Redwood, MD  Assessment/Plan: #1 small bowel obstruction secondary to small bowel stricture secondary to Crohn's disease with retained capsule Patient status post diagnostic laparoscopy with exploratory laparoscopy with small bowel resection and anastomosis per Dr.Ramirez on today. Once patient is started on a diet and tolerating entocort needs to be resumed per GI recommendations. Continue hydration, supportive care. General surgery and GI following.  #2 Crohn's disease Entocort to be resumed once patient has been started and tolerating a diet. Per GI.  #3 prophylaxis Lovenox for DVT prophylaxis.  Code Status: full Family Communication: updated patient. No Family at bedside. Disposition Plan: remaining patient.   Consultants:  Gen. Surgery: Dr. Rosendo Gros 09/29/2014  Gastroenterology: Dr. Ardis Hughs 09/29/2014  Procedures:  Abdominal x-rays 09/29/2014,09/30/2014  Diagnostic laparoscopy with exploratory laparoscopy with small bowel resection and anastomosis per Dr. Rosendo Gros 09/30/2014  Antibiotics:  none  HPI/Subjective: Patient complaining of some abdominal discomfort. Patient denies any nausea or emesis. Patient with NGT.  Objective: Filed Vitals:   10/01/14 0633  BP: 124/64  Pulse: 70  Temp: 98 F (36.7 C)  Resp: 16    Intake/Output Summary (Last 24 hours) at 10/01/14 1147 Last data filed at 10/01/14 0645  Gross per 24 hour  Intake   4630 ml  Output   1830 ml  Net   2800 ml   Filed Weights   09/29/14 1100  Weight: 85.276 kg (188 lb)    Exam:   General:  NAD  Cardiovascular: RRR  Respiratory: CTAB anterior lung fields  Abdomen: soft,hypoactive bowel sounds, bandage over incision site,RLQ TTP.  Musculoskeletal: no clubbing cyanosis or edema.  Data Reviewed: Basic Metabolic Panel:  Recent Labs Lab 09/29/14 0809 09/30/14 0429 10/01/14 0437  NA  137 141 139  K 4.1 4.1 4.6  CL 105 108 106  CO2 27 28 29   GLUCOSE 113* 116* 107*  BUN 17 22* 17  CREATININE 0.97 1.01 0.92  CALCIUM 9.1 8.3* 8.0*  MG 2.1 2.0 2.1   Liver Function Tests:  Recent Labs Lab 09/29/14 0809 09/30/14 0429  AST 14* 14*  ALT 18 14*  ALKPHOS 65 50  BILITOT 1.6* 0.8  PROT 7.2 5.9*  ALBUMIN 4.1 3.3*    Recent Labs Lab 09/29/14 0809  LIPASE 23   No results for input(s): AMMONIA in the last 168 hours. CBC:  Recent Labs Lab 09/29/14 0809 09/30/14 0429 10/01/14 0437  WBC 9.0 8.3 6.8  NEUTROABS 7.7  --   --   HGB 16.5 14.3 12.7*  HCT 49.1 43.4 39.7  MCV 90.6 91.8 92.5  PLT 208 201 160   Cardiac Enzymes: No results for input(s): CKTOTAL, CKMB, CKMBINDEX, TROPONINI in the last 168 hours. BNP (last 3 results) No results for input(s): BNP in the last 8760 hours.  ProBNP (last 3 results) No results for input(s): PROBNP in the last 8760 hours.  CBG: No results for input(s): GLUCAP in the last 168 hours.  Recent Results (from the past 240 hour(s))  Surgical pcr screen     Status: None   Collection Time: 09/30/14 10:01 AM  Result Value Ref Range Status   MRSA, PCR NEGATIVE NEGATIVE Final   Staphylococcus aureus NEGATIVE NEGATIVE Final    Comment:        The Xpert SA Assay (FDA approved for NASAL specimens in patients over 39 years of age), is one component of a comprehensive surveillance program.  Test performance has been validated  by [facility] for patients greater than or equal to 46 year old. It is not intended to diagnose infection nor to guide or monitor treatment.      Studies: Abd 1 View (kub)  09/29/2014   CLINICAL DATA:  Small-bowel obstruction  EXAM: ABDOMEN - 1 VIEW  COMPARISON:  CT from earlier in the same day.  FINDINGS: There are changes consistent with small bowel obstruction similar to that seen on the recent CT examination secondary to an endoscopy capsule lodged within an area of narrowing in the mid abdomen.  More distal small bowel and colon is decompressed. Contrast material distends the urinary bladder. No bony abnormality is seen.  IMPRESSION: Changes consistent with small bowel obstruction secondary to an endoscopy capsule in an area of small bowel narrowing. The findings are similar to that seen on recent CT examination.   Electronically Signed   By: Inez Catalina M.D.   On: 09/29/2014 16:33   Dg Abd Portable 1v  10/01/2014   CLINICAL DATA:  Small bowel obstruction  EXAM: PORTABLE ABDOMEN - 1 VIEW  COMPARISON:  09/30/2014  FINDINGS: The nasogastric tube tip is in the stomach. There is enteric contrast throughout the colon. There is mild mid abdominal small bowel dilatation which is unchanged from 09/30/2014 but substantially improved from 09/29/2014. There is no free intraperitoneal air.  IMPRESSION: Mild residual mid abdominal small bowel dilatation, unchanged.   Electronically Signed   By: Andreas Newport M.D.   On: 10/01/2014 05:38   Dg Abd Portable 1v  09/30/2014   CLINICAL DATA:  Status small bowel resection for obstruction.  EXAM: PORTABLE ABDOMEN - 1 VIEW  COMPARISON:  None.  FINDINGS: Small bowel dilatation seen on the comparison examination has resolved. Contrast material from the patient's CT scan is now seen in a decompressed colon. Surgical staples in the left upper quadrant and midline of the low abdomen are noted. Endoscopy capsule is again seen and projects slightly lower in the pelvis than on the prior study.  IMPRESSION: Small bowel obstruction appears resolved.  NG tube in good position.  Endoscopy capsule appears to have progressed through small bowel somewhat.   Electronically Signed   By: Inge Rise M.D.   On: 09/30/2014 16:07   Dg C-arm 1-60 Min-no Report  09/30/2014   CLINICAL DATA: surgery   C-ARM 1-60 MINUTES  Fluoroscopy was utilized by the requesting physician.  No radiographic  interpretation.     Scheduled Meds: . antiseptic oral rinse  7 mL Mouth Rinse BID  .  enoxaparin (LOVENOX) injection  40 mg Subcutaneous Q24H  . pantoprazole (PROTONIX) IV  40 mg Intravenous Q12H   Continuous Infusions: . sodium chloride 100 mL/hr at 10/01/14 0200    Principal Problem:   SBO (small bowel obstruction) Active Problems:   HYPERCHOLESTEROLEMIA   Regional enteritis (Crohn's disease)   Diverticulosis    Time spent: [place] MD [facility] Pager [phone]. If 7PM-7AM, please contact night-coverage at [email or web address], password [facility] 10/01/2014, 11:47 AM  LOS: 2 days

## 2014-10-01 NOTE — Care Management Note (Signed)
Case Management Note  Patient Details  Name: Terry Kerr MRN: [id number] Date of Birth: 1955/08/24  Subjective/Objective:          59 yo admitted with SBO. Pt underwent DIAGNOSTIC LAPAROSCOPY WITH EXPLORATORY LAP WITH SMALL BOWEL RESECTION AND ANASTAMOSIS on 09/30/14.          Action/Plan: From home with spouse  Expected Discharge Date:   (UNKNOWN)               Expected Discharge Plan:  Home/Self Care  In-House Referral:     Discharge planning Services  CM Consult  Post Acute Care Choice:    Choice offered to:     DME Arranged:    DME Agency:     HH Arranged:    HH Agency:     Status of Service:  In process, will continue to follow  Medicare Important Message Given:    Date Medicare IM Given:    Medicare IM give by:    Date Additional Medicare IM Given:    Additional Medicare Important Message give by:     If discussed at [place] of Stay Meetings, dates discussed:    Additional Comments: Chart reviewed and CM following for DC needs.  Lynnell Catalan, RN 10/01/2014, 2:52 PM

## 2014-10-02 ENCOUNTER — Encounter: Payer: Self-pay | Admitting: *Deleted

## 2014-10-02 DIAGNOSIS — Z9889 Other specified postprocedural states: Secondary | ICD-10-CM

## 2014-10-02 DIAGNOSIS — R1031 Right lower quadrant pain: Secondary | ICD-10-CM

## 2014-10-02 LAB — BASIC METABOLIC PANEL
Anion gap: 7 (ref 5–15)
BUN: 13 mg/dL (ref 6–20)
CO2: 26 mmol/L (ref 22–32)
Calcium: 7.9 mg/dL — ABNORMAL LOW (ref 8.9–10.3)
Chloride: 106 mmol/L (ref 101–111)
Creatinine, Ser: 0.89 mg/dL (ref 0.61–1.24)
GFR calc Af Amer: >60 mL/min (ref >60)
GLUCOSE: 93 mg/dL (ref 70–99)
Potassium: 3.7 mmol/L (ref 3.5–5.1)
Sodium: 139 mmol/L (ref 135–145)

## 2014-10-02 LAB — CBC
HCT: 41.1 % (ref 39.0–52.0)
HEMOGLOBIN: 13.3 g/dL (ref 13.0–17.0)
MCH: 29.8 pg (ref 26.0–34.0)
MCHC: 32.4 g/dL (ref 30.0–36.0)
MCV: 92.2 fL (ref 78.0–100.0)
Platelets: 162 10*3/uL (ref 150–400)
RBC: 4.46 MIL/uL (ref 4.22–5.81)
RDW: 13 % (ref 11.5–15.5)
WBC: 7.5 10*3/uL (ref 4.0–10.5)

## 2014-10-02 LAB — MAGNESIUM: MAGNESIUM: 1.9 mg/dL (ref 1.7–2.4)

## 2014-10-02 MED ORDER — LORAZEPAM 2 MG/ML IJ SOLN
1.0000 mg | Freq: Once | INTRAMUSCULAR | Status: AC
  Administered 2014-10-02: 1 mg via INTRAVENOUS
  Filled 2014-10-02: qty 1

## 2014-10-02 NOTE — Progress Notes (Signed)
OT Cancellation Note  Patient Details Name: Terry Kerr MRN: [id number] DOB: 1955-08-10   Cancelled Treatment:    Reason Eval/Treat Not Completed: OT screened, no needs identified, will sign off. Pt states he has been getting up to the bathroom and donned his own pants. He reports no OT needs.  [place], [place] 10/02/2014, 1:35 PM

## 2014-10-02 NOTE — Progress Notes (Signed)
2 Days Post-Op  Subjective: Doing well.  No changes  Objective: Vital signs in last 24 hours: Temp:  [98.5 F (36.9 C)-99 F (37.2 C)] 99 F (37.2 C) (05/06 0537) Pulse Rate:  [66-76] 76 (05/06 0537) Resp:  [16] 16 (05/06 0537) BP: (119-120)/(54-59) 120/54 mmHg (05/06 0537) SpO2:  [91 %-92 %] 92 % (05/06 0537) Last BM Date: 09/28/14  Intake/Output from previous day: 05/05 0701 - 05/06 0700 In: 2351.7 [I.V.:2351.7] Out: 830 [Urine:300; Emesis/NG output:530] Intake/Output this shift:    General appearance: alert and cooperative GI: soft, approp ttp, wound c/d/i, hypoactive BS  Lab Results:   Recent Labs  10/01/14 0437 10/02/14 0415  WBC 6.8 7.5  HGB 12.7* 13.3  HCT 39.7 41.1  PLT 160 162   BMET  Recent Labs  10/01/14 0437 10/02/14 0415  NA 139 139  K 4.6 3.7  CL 106 106  CO2 29 26  GLUCOSE 107* 93  BUN 17 13  CREATININE 0.92 0.89  CALCIUM 8.0* 7.9*   PT/INR No results for input(s): LABPROT, INR in the last 72 hours. ABG No results for input(s): PHART, HCO3 in the last 72 hours.  Invalid input(s): PCO2, PO2  Studies/Results: Dg Abd Portable 1v  10/01/2014   CLINICAL DATA:  Small bowel obstruction  EXAM: PORTABLE ABDOMEN - 1 VIEW  COMPARISON:  09/30/2014  FINDINGS: The nasogastric tube tip is in the stomach. There is enteric contrast throughout the colon. There is mild mid abdominal small bowel dilatation which is unchanged from 09/30/2014 but substantially improved from 09/29/2014. There is no free intraperitoneal air.  IMPRESSION: Mild residual mid abdominal small bowel dilatation, unchanged.   Electronically Signed   By: Andreas Newport M.D.   On: 10/01/2014 05:38   Dg Abd Portable 1v  09/30/2014   CLINICAL DATA:  Status small bowel resection for obstruction.  EXAM: PORTABLE ABDOMEN - 1 VIEW  COMPARISON:  None.  FINDINGS: Small bowel dilatation seen on the comparison examination has resolved. Contrast material from the patient's CT scan is now seen in a  decompressed colon. Surgical staples in the left upper quadrant and midline of the low abdomen are noted. Endoscopy capsule is again seen and projects slightly lower in the pelvis than on the prior study.  IMPRESSION: Small bowel obstruction appears resolved.  NG tube in good position.  Endoscopy capsule appears to have progressed through small bowel somewhat.   Electronically Signed   By: Inge Rise M.D.   On: 09/30/2014 16:07   Dg C-arm 1-60 Min-no Report  09/30/2014   CLINICAL DATA: surgery   C-ARM 1-60 MINUTES  Fluoroscopy was utilized by the requesting physician.  No radiographic  interpretation.     Anti-infectives: Anti-infectives    Start     Dose/Rate Route Frequency Ordered Stop   09/30/14 1159  cefoTEtan (CEFOTAN) 2 g in dextrose 5 % 50 mL IVPB     2 g 100 mL/hr over 30 Minutes Intravenous 60 min pre-op 09/30/14 1153 09/30/14 1245      Assessment/Plan: s/p Procedure(s): DIAGNOSTIC LAPAROSCOPY WITH EXPLORATORY LAP WITH SMALL BOWEL RESECTION AND ANASTAMOSIS (N/A) Con't NGT for now  Possible claming trials in next 1-2 days MObilize Wound dressing changes  LOS: 3 days    Rosario Jacks., Anne Hahn 10/02/2014

## 2014-10-02 NOTE — Progress Notes (Signed)
PT Cancellation Note  Patient Details Name: Terry Kerr MRN: [id number] DOB: Jul 31, 1955   Cancelled Treatment:    Reason Eval/Treat Not Completed: PT screened, no needs identified, will sign off   [facility] 10/02/2014, 11:33 AM

## 2014-10-02 NOTE — Progress Notes (Signed)
[facility] PROGRESS NOTE  Benecio Kluger ZOX:[id number] DOB: [birth year] DOA: 09/29/2014 PCP: Marton Redwood, MD  Assessment/Plan: #1 small bowel obstruction secondary to small bowel stricture secondary to Crohn's disease with retained capsule Patient status post diagnostic laparoscopy with exploratory laparoscopy with small bowel resection and anastomosis per Dr.Ramirez of general surgery. Per general surgery possible clamping trials this weekend. Continue wound dressing changes per general surgery. Once patient is started on a diet and tolerating entocort needs to be resumed per GI recommendations. Continue hydration, supportive care. General surgery and GI following.  #2 Crohn's disease Entocort to be resumed once patient has been started and tolerating a diet. Per GI.  #3 prophylaxis Lovenox for DVT prophylaxis.  Code Status: full Family Communication: updated patient. No Family at bedside. Disposition Plan: remain in patient.   Consultants:  Gen. Surgery: Dr. Rosendo Gros 09/29/2014  Gastroenterology: Dr. Ardis Hughs 09/29/2014  Procedures:  Abdominal x-rays 09/29/2014,09/30/2014  Diagnostic laparoscopy with exploratory laparoscopy with small bowel resection and anastomosis per Dr. Rosendo Gros 09/30/2014  Antibiotics:  none  HPI/Subjective: Patient denies any nausea or emesis. Patient with NGT. Patient noted to be ambulating in the hallways. Some complaints of abdominal discomfort. Patient denies any bowel movement. Patient denies any flatus.  Objective: Filed Vitals:   10/02/14 1500  BP: 143/75  Pulse: 78  Temp: 98.6 F (37 C)  Resp: 16    Intake/Output Summary (Last 24 hours) at 10/02/14 1929 Last data filed at 10/02/14 1618  Gross per 24 hour  Intake 2351.66 ml  Output    730 ml  Net 1621.66 ml   Filed Weights   09/29/14 1100  Weight: 85.276 kg (188 lb)    Exam:   General:  NAD  Cardiovascular: RRR  Respiratory: CTAB anterior lung fields  Abdomen:  soft,hypoactive bowel sounds, bandage over incision site, RLQ TTP.  Musculoskeletal: no clubbing cyanosis or edema.  Data Reviewed: Basic Metabolic Panel:  Recent Labs Lab 09/29/14 0809 09/30/14 0429 10/01/14 0437 10/02/14 0415  NA 137 141 139 139  K 4.1 4.1 4.6 3.7  CL 105 108 106 106  CO2 27 28 29 26   GLUCOSE 113* 116* 107* 93  BUN 17 22* 17 13  CREATININE 0.97 1.01 0.92 0.89  CALCIUM 9.1 8.3* 8.0* 7.9*  MG 2.1 2.0 2.1 1.9   Liver Function Tests:  Recent Labs Lab 09/29/14 0809 09/30/14 0429  AST 14* 14*  ALT 18 14*  ALKPHOS 65 50  BILITOT 1.6* 0.8  PROT 7.2 5.9*  ALBUMIN 4.1 3.3*    Recent Labs Lab 09/29/14 0809  LIPASE 23   No results for input(s): AMMONIA in the last 168 hours. CBC:  Recent Labs Lab 09/29/14 0809 09/30/14 0429 10/01/14 0437 10/02/14 0415  WBC 9.0 8.3 6.8 7.5  NEUTROABS 7.7  --   --   --   HGB 16.5 14.3 12.7* 13.3  HCT 49.1 43.4 39.7 41.1  MCV 90.6 91.8 92.5 92.2  PLT 208 201 160 162   Cardiac Enzymes: No results for input(s): CKTOTAL, CKMB, CKMBINDEX, TROPONINI in the last 168 hours. BNP (last 3 results) No results for input(s): BNP in the last 8760 hours.  ProBNP (last 3 results) No results for input(s): PROBNP in the last 8760 hours.  CBG: No results for input(s): GLUCAP in the last 168 hours.  Recent Results (from the past 240 hour(s))  Surgical pcr screen     Status: None   Collection Time: 09/30/14 10:01 AM  Result Value Ref Range Status   MRSA,  PCR NEGATIVE NEGATIVE Final   Staphylococcus aureus NEGATIVE NEGATIVE Final    Comment:        The Xpert SA Assay (FDA approved for NASAL specimens in patients over 21 years of age), is one component of a comprehensive surveillance program.  Test performance has been validated by [facility] for patients greater than or equal to 24 year old. It is not intended to diagnose infection nor to guide or monitor treatment.      Studies: Dg Abd Portable 1v  10/01/2014    CLINICAL DATA:  Small bowel obstruction  EXAM: PORTABLE ABDOMEN - 1 VIEW  COMPARISON:  09/30/2014  FINDINGS: The nasogastric tube tip is in the stomach. There is enteric contrast throughout the colon. There is mild mid abdominal small bowel dilatation which is unchanged from 09/30/2014 but substantially improved from 09/29/2014. There is no free intraperitoneal air.  IMPRESSION: Mild residual mid abdominal small bowel dilatation, unchanged.   Electronically Signed   By: Andreas Newport M.D.   On: 10/01/2014 05:38    Scheduled Meds: . antiseptic oral rinse  7 mL Mouth Rinse BID  . enoxaparin (LOVENOX) injection  40 mg Subcutaneous Q24H  . pantoprazole (PROTONIX) IV  40 mg Intravenous Q12H   Continuous Infusions: . sodium chloride 100 mL/hr at 10/02/14 1618    Principal Problem:   SBO (small bowel obstruction) Active Problems:   HYPERCHOLESTEROLEMIA   Regional enteritis (Crohn's disease)   Diverticulosis    Time spent: [place] MD [facility] Pager [phone]. If 7PM-7AM, please contact night-coverage at [email or web address], password [facility] 10/02/2014, 7:29 PM  LOS: 3 days

## 2014-10-03 LAB — COMPREHENSIVE METABOLIC PANEL
ALK PHOS: 40 U/L (ref 38–126)
ALT: 13 U/L — ABNORMAL LOW (ref 17–63)
AST: 11 U/L — AB (ref 15–41)
Albumin: 2.7 g/dL — ABNORMAL LOW (ref 3.5–5.0)
Anion gap: 5 (ref 5–15)
BILIRUBIN TOTAL: 1.3 mg/dL — AB (ref 0.3–1.2)
BUN: 14 mg/dL (ref 6–20)
CO2: 22 mmol/L (ref 22–32)
Calcium: 7.8 mg/dL — ABNORMAL LOW (ref 8.9–10.3)
Chloride: 111 mmol/L (ref 101–111)
Creatinine, Ser: 0.76 mg/dL (ref 0.61–1.24)
GFR calc Af Amer: >60 mL/min (ref >60)
GFR calc non Af Amer: >60 mL/min (ref >60)
Glucose, Bld: 83 mg/dL (ref 70–99)
POTASSIUM: 3.7 mmol/L (ref 3.5–5.1)
Sodium: 138 mmol/L (ref 135–145)
Total Protein: 5.4 g/dL — ABNORMAL LOW (ref 6.5–8.1)

## 2014-10-03 LAB — CBC
HEMATOCRIT: 37.6 % — AB (ref 39.0–52.0)
HEMOGLOBIN: 12.4 g/dL — AB (ref 13.0–17.0)
MCH: 29.7 pg (ref 26.0–34.0)
MCHC: 33 g/dL (ref 30.0–36.0)
MCV: 90.2 fL (ref 78.0–100.0)
Platelets: 156 10*3/uL (ref 150–400)
RBC: 4.17 MIL/uL — ABNORMAL LOW (ref 4.22–5.81)
RDW: 12.6 % (ref 11.5–15.5)
WBC: 6.4 10*3/uL (ref 4.0–10.5)

## 2014-10-03 LAB — MAGNESIUM: MAGNESIUM: 2.1 mg/dL (ref 1.7–2.4)

## 2014-10-03 MED ORDER — POTASSIUM CHLORIDE 10 MEQ/100ML IV SOLN
10.0000 meq | INTRAVENOUS | Status: AC
  Administered 2014-10-03 (×5): 10 meq via INTRAVENOUS
  Filled 2014-10-03 (×5): qty 100

## 2014-10-03 NOTE — Progress Notes (Signed)
During rounds pt was in bed asleep. NGT was found on floor. On call provider notified. No new orders at this time. Will continue to monitor pt.

## 2014-10-03 NOTE — Progress Notes (Signed)
[facility] PROGRESS NOTE  Terry Kerr AOZ:[id number] DOB: May 25, 1956 DOA: 09/29/2014 PCP: Marton Redwood, MD  Assessment/Plan: #1 small bowel obstruction secondary to small bowel stricture secondary to Crohn's disease with retained capsule Patient status post diagnostic laparoscopy with exploratory laparoscopy with small bowel resection and anastomosis per Dr.Ramirez of general surgery. Per general surgery possible clamping trials this weekend. Continue wound dressing changes per general surgery. Patient passing flatus. No bowel movement. Once patient is started on a diet and tolerating entocort needs to be resumed per GI recommendations. Continue hydration, supportive care. General surgery and GI following.  #2 Crohn's disease Entocort to be resumed once patient has been started and tolerating a diet. Per GI.  #3 prophylaxis Lovenox for DVT prophylaxis.  Code Status: full Family Communication: updated patient and family at bedside. Disposition Plan: remain in patient.   Consultants:  Gen. Surgery: Dr. Rosendo Gros 09/29/2014  Gastroenterology: Dr. Ardis Hughs 09/29/2014  Procedures:  Abdominal x-rays 09/29/2014,09/30/2014  Diagnostic laparoscopy with exploratory laparoscopy with small bowel resection and anastomosis per Dr. Rosendo Gros 09/30/2014  Antibiotics:  none  HPI/Subjective: Patient denies any nausea or emesis. Patient's NGT fell out. Patient noted to be ambulating in the hallways. Patient denies any bowel movement. Patient endorses flatus.  Objective: Filed Vitals:   10/03/14 0501  BP: 126/61  Pulse: 65  Temp: 98 F (36.7 C)  Resp: 16    Intake/Output Summary (Last 24 hours) at 10/03/14 1150 Last data filed at 10/03/14 0245  Gross per 24 hour  Intake      0 ml  Output    500 ml  Net   -500 ml   Filed Weights   09/29/14 1100  Weight: 85.276 kg (188 lb)    Exam:   General:  NAD  Cardiovascular: RRR  Respiratory: CTAB anterior lung fields  Abdomen:  soft,hypoactive bowel sounds, bandage over incision site, RLQ less TTP.  Musculoskeletal: no clubbing cyanosis or edema.  Data Reviewed: Basic Metabolic Panel:  Recent Labs Lab 09/29/14 0809 09/30/14 0429 10/01/14 0437 10/02/14 0415 10/03/14 0350  NA 137 141 139 139 138  K 4.1 4.1 4.6 3.7 3.7  CL 105 108 106 106 111  CO2 27 28 29 26 22   GLUCOSE 113* 116* 107* 93 83  BUN 17 22* 17 13 14   CREATININE 0.97 1.01 0.92 0.89 0.76  CALCIUM 9.1 8.3* 8.0* 7.9* 7.8*  MG 2.1 2.0 2.1 1.9 2.1   Liver Function Tests:  Recent Labs Lab 09/29/14 0809 09/30/14 0429 10/03/14 0350  AST 14* 14* 11*  ALT 18 14* 13*  ALKPHOS 65 50 40  BILITOT 1.6* 0.8 1.3*  PROT 7.2 5.9* 5.4*  ALBUMIN 4.1 3.3* 2.7*    Recent Labs Lab 09/29/14 0809  LIPASE 23   No results for input(s): AMMONIA in the last 168 hours. CBC:  Recent Labs Lab 09/29/14 0809 09/30/14 0429 10/01/14 0437 10/02/14 0415 10/03/14 0350  WBC 9.0 8.3 6.8 7.5 6.4  NEUTROABS 7.7  --   --   --   --   HGB 16.5 14.3 12.7* 13.3 12.4*  HCT 49.1 43.4 39.7 41.1 37.6*  MCV 90.6 91.8 92.5 92.2 90.2  PLT 208 201 160 162 156   Cardiac Enzymes: No results for input(s): CKTOTAL, CKMB, CKMBINDEX, TROPONINI in the last 168 hours. BNP (last 3 results) No results for input(s): BNP in the last 8760 hours.  ProBNP (last 3 results) No results for input(s): PROBNP in the last 8760 hours.  CBG: No results for input(s): GLUCAP in  the last 168 hours.  Recent Results (from the past 240 hour(s))  Surgical pcr screen     Status: None   Collection Time: 09/30/14 10:01 AM  Result Value Ref Range Status   MRSA, PCR NEGATIVE NEGATIVE Final   Staphylococcus aureus NEGATIVE NEGATIVE Final    Comment:        The Xpert SA Assay (FDA approved for NASAL specimens in patients over 50 years of age), is one component of a comprehensive surveillance program.  Test performance has been validated by [facility] for patients greater than or equal to  54 year old. It is not intended to diagnose infection nor to guide or monitor treatment.      Studies: No results found.  Scheduled Meds: . antiseptic oral rinse  7 mL Mouth Rinse BID  . enoxaparin (LOVENOX) injection  40 mg Subcutaneous Q24H  . pantoprazole (PROTONIX) IV  40 mg Intravenous Q12H   Continuous Infusions: . sodium chloride 100 mL/hr at 10/03/14 0300    Principal Problem:   SBO (small bowel obstruction) Active Problems:   HYPERCHOLESTEROLEMIA   Regional enteritis (Crohn's disease)   Diverticulosis    Time spent: [place] MD [facility] Pager [phone]. If 7PM-7AM, please contact night-coverage at [email or web address], password [facility] 10/03/2014, 11:50 AM  LOS: 4 days

## 2014-10-03 NOTE — Progress Notes (Signed)
3 Days Post-Op  Subjective: Doing well. NG fell out last night.  Feeling ok.  No nausea.  Having some flatus  Objective: Vital signs in last 24 hours: Temp:  [97.7 F (36.5 C)-98.6 F (37 C)] 98 F (36.7 C) (05/07 0501) Pulse Rate:  [65-78] 65 (05/07 0501) Resp:  [16] 16 (05/07 0501) BP: (126-143)/(61-75) 126/61 mmHg (05/07 0501) SpO2:  [95 %-97 %] 95 % (05/07 0501) Last BM Date: 09/28/14  Intake/Output from previous day: 05/06 0701 - 05/07 0700 In: 0  Out: 500 [Emesis/NG output:500] Intake/Output this shift:    General appearance: alert and cooperative GI: soft, approp ttp, wound c/d/i, hypoactive BS  Lab Results:   Recent Labs  10/02/14 0415 10/03/14 0350  WBC 7.5 6.4  HGB 13.3 12.4*  HCT 41.1 37.6*  PLT 162 156   BMET  Recent Labs  10/02/14 0415 10/03/14 0350  NA 139 138  K 3.7 3.7  CL 106 111  CO2 26 22  GLUCOSE 93 83  BUN 13 14  CREATININE 0.89 0.76  CALCIUM 7.9* 7.8*   PT/INR No results for input(s): LABPROT, INR in the last 72 hours. ABG No results for input(s): PHART, HCO3 in the last 72 hours.  Invalid input(s): PCO2, PO2  Studies/Results: No results found.  Anti-infectives: Anti-infectives    Start     Dose/Rate Route Frequency Ordered Stop   09/30/14 1159  cefoTEtan (CEFOTAN) 2 g in dextrose 5 % 50 mL IVPB     2 g 100 mL/hr over 30 Minutes Intravenous 60 min pre-op 09/30/14 1153 09/30/14 1245      Assessment/Plan: s/p Procedure(s): DIAGNOSTIC LAPAROSCOPY WITH EXPLORATORY LAP WITH SMALL BOWEL RESECTION AND ANASTAMOSIS (N/A)  Will keep NG out for now, ok for sips of clears only, await return of bowel function MObilize Wound dressing changes  LOS: 4 days    Matia Zelada C. 06/29/7469

## 2014-10-04 DIAGNOSIS — Z9049 Acquired absence of other specified parts of digestive tract: Secondary | ICD-10-CM | POA: Insufficient documentation

## 2014-10-04 MED ORDER — OXYCODONE-ACETAMINOPHEN 5-325 MG PO TABS: 1.0000 | ORAL_TABLET | ORAL | Status: DC | PRN

## 2014-10-04 NOTE — Progress Notes (Signed)
4 Days Post-Op  Subjective: Doing well.  Feels hungry.  Had a BM  Objective: Vital signs in last 24 hours: Temp:  [98 F (36.7 C)-98.6 F (37 C)] 98 F (36.7 C) (05/08 0600) Pulse Rate:  [63-73] 63 (05/08 0600) Resp:  [16-18] 16 (05/08 0600) BP: (118-154)/(60-87) 118/60 mmHg (05/08 0600) SpO2:  [96 %-97 %] 96 % (05/08 0600) Last BM Date: 10/04/14  Intake/Output from previous day:   Intake/Output this shift:    General appearance: alert and cooperative GI: soft, approp ttp, wound clean and dry  Lab Results:   Recent Labs  10/02/14 0415 10/03/14 0350  WBC 7.5 6.4  HGB 13.3 12.4*  HCT 41.1 37.6*  PLT 162 156   BMET  Recent Labs  10/02/14 0415 10/03/14 0350  NA 139 138  K 3.7 3.7  CL 106 111  CO2 26 22  GLUCOSE 93 83  BUN 13 14  CREATININE 0.89 0.76  CALCIUM 7.9* 7.8*   PT/INR No results for input(s): LABPROT, INR in the last 72 hours. ABG No results for input(s): PHART, HCO3 in the last 72 hours.  Invalid input(s): PCO2, PO2  Studies/Results: No results found.  Anti-infectives: Anti-infectives    Start     Dose/Rate Route Frequency Ordered Stop   09/30/14 1159  cefoTEtan (CEFOTAN) 2 g in dextrose 5 % 50 mL IVPB     2 g 100 mL/hr over 30 Minutes Intravenous 60 min pre-op 09/30/14 1153 09/30/14 1245      Assessment/Plan: s/p Procedure(s): DIAGNOSTIC LAPAROSCOPY WITH EXPLORATORY LAP WITH SMALL BOWEL RESECTION AND ANASTAMOSIS (N/A)  Will start full liquids today MObilize Wound dressing changes as needed Possible d/c soon  LOS: 5 days    Dashan Chizmar C. 09/26/6999

## 2014-10-04 NOTE — Plan of Care (Signed)
Problem: Phase II Progression Outcomes Goal: Return of bowel function (flatus, BM) IF ABDOMINAL SURGERY:  Outcome: Progressing + flatus, no BM yet

## 2014-10-04 NOTE — Progress Notes (Signed)
Pt said he has been informed he will continue dressing changes to abdominal incision when he goes home. I went thought the process with him tonight during the dressing change and answered his questions. He said "I don't think my wife is going to be able to do this". Wife will be the one doing dressing changes per pt. She was not present tonight, but will need instruction on dressing changes prior to discharge. Hortencia Conradi RN

## 2014-10-04 NOTE — Progress Notes (Signed)
[facility] PROGRESS NOTE  Simpson Paulos GLO:[id number] DOB: March 29, 1956 DOA: 09/29/2014 PCP: Marton Redwood, MD  Assessment/Plan: #1 small bowel obstruction secondary to small bowel stricture secondary to Crohn's disease with retained capsule Patient status post diagnostic laparoscopy with exploratory laparoscopy with small bowel resection and anastomosis per Dr.Ramirez of general surgery. Per general surgery possible clamping trials this weekend. Continue wound dressing changes per general surgery. Patient passing flatus and had a bowel movement today. Patient has been started on clear liquids per general surgery.Once patient is tolerating an advanced solid diet, entocort needs to be resumed per GI recommendations. Continue hydration, supportive care. General surgery and GI following.  #2 Crohn's disease Entocort to be resumed once patient has been started and tolerating a diet. Per GI.  #3 prophylaxis Lovenox for DVT prophylaxis.  Code Status: full Family Communication: updated patient, no family at bedside. Disposition Plan: Per general surgery.   Consultants:  Gen. Surgery: Dr. Rosendo Gros 09/29/2014  Gastroenterology: Dr. Ardis Hughs 09/29/2014  Procedures:  Abdominal x-rays 09/29/2014,09/30/2014  Diagnostic laparoscopy with exploratory laparoscopy with small bowel resection and anastomosis per Dr. Rosendo Gros 09/30/2014  Antibiotics:  none  HPI/Subjective: Patient denies any nausea or emesis. Patient states improvement with abdominal pain. Patient states passing flatus. Patient states had a bowel movement today.  Objective: Filed Vitals:   10/04/14 0600  BP: 118/60  Pulse: 63  Temp: 98 F (36.7 C)  Resp: 16   No intake or output data in the 24 hours ending 10/04/14 1229 Filed Weights   09/29/14 1100  Weight: 85.276 kg (188 lb)    Exam:   General:  NAD  Cardiovascular: RRR  Respiratory: CTAB anterior lung fields  Abdomen: soft,+ bowel sounds, bandage over  incision site, RLQ less TTP.  Musculoskeletal: no clubbing cyanosis or edema.  Data Reviewed: Basic Metabolic Panel:  Recent Labs Lab 09/29/14 0809 09/30/14 0429 10/01/14 0437 10/02/14 0415 10/03/14 0350  NA 137 141 139 139 138  K 4.1 4.1 4.6 3.7 3.7  CL 105 108 106 106 111  CO2 27 28 29 26 22   GLUCOSE 113* 116* 107* 93 83  BUN 17 22* 17 13 14   CREATININE 0.97 1.01 0.92 0.89 0.76  CALCIUM 9.1 8.3* 8.0* 7.9* 7.8*  MG 2.1 2.0 2.1 1.9 2.1   Liver Function Tests:  Recent Labs Lab 09/29/14 0809 09/30/14 0429 10/03/14 0350  AST 14* 14* 11*  ALT 18 14* 13*  ALKPHOS 65 50 40  BILITOT 1.6* 0.8 1.3*  PROT 7.2 5.9* 5.4*  ALBUMIN 4.1 3.3* 2.7*    Recent Labs Lab 09/29/14 0809  LIPASE 23   No results for input(s): AMMONIA in the last 168 hours. CBC:  Recent Labs Lab 09/29/14 0809 09/30/14 0429 10/01/14 0437 10/02/14 0415 10/03/14 0350  WBC 9.0 8.3 6.8 7.5 6.4  NEUTROABS 7.7  --   --   --   --   HGB 16.5 14.3 12.7* 13.3 12.4*  HCT 49.1 43.4 39.7 41.1 37.6*  MCV 90.6 91.8 92.5 92.2 90.2  PLT 208 201 160 162 156   Cardiac Enzymes: No results for input(s): CKTOTAL, CKMB, CKMBINDEX, TROPONINI in the last 168 hours. BNP (last 3 results) No results for input(s): BNP in the last 8760 hours.  ProBNP (last 3 results) No results for input(s): PROBNP in the last 8760 hours.  CBG: No results for input(s): GLUCAP in the last 168 hours.  Recent Results (from the past 240 hour(s))  Surgical pcr screen     Status: None  Collection Time: 09/30/14 10:01 AM  Result Value Ref Range Status   MRSA, PCR NEGATIVE NEGATIVE Final   Staphylococcus aureus NEGATIVE NEGATIVE Final    Comment:        The Xpert SA Assay (FDA approved for NASAL specimens in patients over 59 years of age), is one component of a comprehensive surveillance program.  Test performance has been validated by [facility] for patients greater than or equal to 59 year old. It is not intended to  diagnose infection nor to guide or monitor treatment.      Studies: No results found.  Scheduled Meds: . antiseptic oral rinse  7 mL Mouth Rinse BID  . enoxaparin (LOVENOX) injection  40 mg Subcutaneous Q24H  . pantoprazole (PROTONIX) IV  40 mg Intravenous Q12H   Continuous Infusions:    Principal Problem:   SBO (small bowel obstruction) Active Problems:   HYPERCHOLESTEROLEMIA   Regional enteritis (Crohn's disease)   Diverticulosis    Time spent: [place] MD [facility] Pager [phone]. If 7PM-7AM, please contact night-coverage at [email or web address], password [facility] 10/04/2014, 12:29 PM  LOS: 59 days

## 2014-10-05 DIAGNOSIS — R109 Unspecified abdominal pain: Secondary | ICD-10-CM | POA: Insufficient documentation

## 2014-10-05 LAB — BASIC METABOLIC PANEL
Anion gap: 3 — ABNORMAL LOW (ref 5–15)
BUN: 9 mg/dL (ref 6–20)
CALCIUM: 8.4 mg/dL — AB (ref 8.9–10.3)
CO2: 25 mmol/L (ref 22–32)
Chloride: 111 mmol/L (ref 101–111)
Creatinine, Ser: 0.86 mg/dL (ref 0.61–1.24)
GFR calc Af Amer: >60 mL/min (ref >60)
GFR calc non Af Amer: >60 mL/min (ref >60)
GLUCOSE: 100 mg/dL — AB (ref 70–99)
POTASSIUM: 3.8 mmol/L (ref 3.5–5.1)
SODIUM: 139 mmol/L (ref 135–145)

## 2014-10-05 LAB — CBC
HEMATOCRIT: 38.8 % — AB (ref 39.0–52.0)
HEMOGLOBIN: 13.3 g/dL (ref 13.0–17.0)
MCH: 30.4 pg (ref 26.0–34.0)
MCHC: 34.3 g/dL (ref 30.0–36.0)
MCV: 88.8 fL (ref 78.0–100.0)
Platelets: 218 10*3/uL (ref 150–400)
RBC: 4.37 MIL/uL (ref 4.22–5.81)
RDW: 13 % (ref 11.5–15.5)
WBC: 5.5 10*3/uL (ref 4.0–10.5)

## 2014-10-05 MED ORDER — BUDESONIDE 3 MG PO CPEP: 9.0000 mg | ORAL_CAPSULE | Freq: Every day | ORAL | Status: DC

## 2014-10-05 MED ORDER — PANTOPRAZOLE SODIUM 40 MG PO TBEC
40.0000 mg | DELAYED_RELEASE_TABLET | Freq: Two times a day (BID) | ORAL | Status: DC
Start: 2014-10-05 — End: 2014-10-05
  Filled 2014-10-05: qty 1

## 2014-10-05 MED ORDER — OXYCODONE-ACETAMINOPHEN 5-325 MG PO TABS: 1.0000 | ORAL_TABLET | ORAL | Status: DC | PRN

## 2014-10-05 MED ORDER — BUDESONIDE 3 MG PO CPEP
9.0000 mg | ORAL_CAPSULE | Freq: Every day | ORAL | Status: DC
  Administered 2014-10-05: 9 mg via ORAL
  Filled 2014-10-05: qty 3

## 2014-10-05 NOTE — Progress Notes (Signed)
5 Days Post-Op  Subjective: He looks great and wants to eat and go home.  Open site looks good.  Objective: Vital signs in last 24 hours: Temp:  [98.2 F (36.8 C)-98.3 F (36.8 C)] 98.3 F (36.8 C) (05/09 0505) Pulse Rate:  [64-78] 64 (05/09 0505) Resp:  [16] 16 (05/09 0505) BP: (125-133)/(60-77) 125/77 mmHg (05/09 0505) SpO2:  [94 %-97 %] 95 % (05/09 0505) Last BM Date: 10/04/14 Afebrile, VSS Labs OK Pt reports BM  And doing well with full liquids Intake/Output from previous day: 05/08 0701 - 05/09 0700 In: 240 [P.O.:240] Out: -  Intake/Output this shift:    General appearance: alert, cooperative and no distress Resp: clear to auscultation bilaterally GI: soft, non-tender; bowel sounds normal; no masses,  no organomegaly and open site looks good.  Lab Results:   Recent Labs  10/03/14 0350 10/05/14 0428  WBC 6.4 5.5  HGB 12.4* 13.3  HCT 37.6* 38.8*  PLT 156 218    BMET  Recent Labs  10/03/14 0350 10/05/14 0428  NA 138 139  K 3.7 3.8  CL 111 111  CO2 22 25  GLUCOSE 83 100*  BUN 14 9  CREATININE 0.76 0.86  CALCIUM 7.8* 8.4*   PT/INR No results for input(s): LABPROT, INR in the last 72 hours.   Recent Labs Lab 09/29/14 0809 09/30/14 0429 10/03/14 0350  AST 14* 14* 11*  ALT 18 14* 13*  ALKPHOS 65 50 40  BILITOT 1.6* 0.8 1.3*  PROT 7.2 5.9* 5.4*  ALBUMIN 4.1 3.3* 2.7*     Lipase     Component Value Date/Time   LIPASE 23 09/29/2014 0809     Studies/Results: No results found.  Medications: . antiseptic oral rinse  7 mL Mouth Rinse BID  . enoxaparin (LOVENOX) injection  40 mg Subcutaneous Q24H  . pantoprazole  40 mg Oral BID    Assessment/Plan Small bowel obstruction caused by SB stricture, creeping fat on multiple sections of the SB. No further strictures seen. DIAGNOSTIC LAPAROSCOPY WITH EXPLORATORY LAP WITH SMALL BOWEL RESECTION AND ANASTAMOSIS, 09/30/14, Dr. Ralene Ok. SBO with endoscopy capsule obstruction at the point of  transition Crohn's disease since 1980 Hx of diverticulitis Hyperlipidemia DVT: Lovenox/SCD Antibiotics: None    Plan;  If he does well with soft food,l I think he could go after supper.  He is very motivated to go home. I have a follow up appointment for him next week, he has an open wound with some 2 x 2's in between the 3 staples.  I will just keep him with the wet to dry for now.  GI needs to see and decide what to put him on for his Crohn's.       LOS: 6 days    Terry Kerr 10/05/2014

## 2014-10-05 NOTE — Progress Notes (Signed)
[facility] PROGRESS NOTE  Terry Kerr ALP:[id number] DOB: 1956/02/27 DOA: 09/29/2014 PCP: Marton Redwood, MD  Assessment/Plan: #1 small bowel obstruction secondary to small bowel stricture secondary to Crohn's disease with retained capsule Patient status post diagnostic laparoscopy with exploratory laparoscopy with small bowel resection and anastomosis per Dr.Ramirez of general surgery. Patient with clinical improvement. Patient tolerating full liquids and diet has been advanced to soft diet per surgery. Will resume entocort. Continue wound dressing changes per general surgery. Patient passing flatus and had a bowel movement today. General surgery following.  #2 Crohn's disease Patient's diet has been advanced, will resume Entocort at 69m daily per GI rxcs. Outpatient f/u with GI.  #3 prophylaxis Lovenox for DVT prophylaxis.  Code Status: full Family Communication: updated patient, no family at bedside. Disposition Plan: Per general surgery.   Consultants:  Gen. Surgery: Dr. RRosendo Gros05/07/2014  Gastroenterology: Dr. JArdis Hughs05/07/2014  Procedures:  Abdominal x-rays 09/29/2014,09/30/2014  Diagnostic laparoscopy with exploratory laparoscopy with small bowel resection and anastomosis per Dr. RRosendo Gros05/08/2014  Antibiotics:  none  HPI/Subjective: Patient denies any nausea or emesis. Patient started soft diet. Patient tolerated full liquids. Patient states passing flatus. Patient states had a bowel movement.  Objective: Filed Vitals:   10/05/14 1353  BP: 133/72  Pulse: 70  Temp: 98.2 F (36.8 C)  Resp: 16    Intake/Output Summary (Last 24 hours) at 10/05/14 1705 Last data filed at 10/05/14 1300  Gross per 24 hour  Intake    480 ml  Output      0 ml  Net    480 ml   Filed Weights   09/29/14 1100  Weight: 85.276 kg (188 lb)    Exam:   General:  NAD  Cardiovascular: RRR  Respiratory: CTAB anterior lung fields  Abdomen: soft,+ bowel sounds, bandage  over incision site, RLQ  NTTP.  Musculoskeletal: no clubbing cyanosis or edema.  Data Reviewed: Basic Metabolic Panel:  Recent Labs Lab 09/29/14 0809 09/30/14 0429 10/01/14 0437 10/02/14 0415 10/03/14 0350 10/05/14 0428  NA 137 141 139 139 138 139  K 4.1 4.1 4.6 3.7 3.7 3.8  CL 105 108 106 106 111 111  CO2 27 28 29 26 22 25   GLUCOSE 113* 116* 107* 93 83 100*  BUN 17 22* 17 13 14 9   CREATININE 0.97 1.01 0.92 0.89 0.76 0.86  CALCIUM 9.1 8.3* 8.0* 7.9* 7.8* 8.4*  MG 2.1 2.0 2.1 1.9 2.1  --    Liver Function Tests:  Recent Labs Lab 09/29/14 0809 09/30/14 0429 10/03/14 0350  AST 14* 14* 11*  ALT 18 14* 13*  ALKPHOS 65 50 40  BILITOT 1.6* 0.8 1.3*  PROT 7.2 5.9* 5.4*  ALBUMIN 4.1 3.3* 2.7*    Recent Labs Lab 09/29/14 0809  LIPASE 23   No results for input(s): AMMONIA in the last 168 hours. CBC:  Recent Labs Lab 09/29/14 0809 09/30/14 0429 10/01/14 0437 10/02/14 0415 10/03/14 0350 10/05/14 0428  WBC 9.0 8.3 6.8 7.5 6.4 5.5  NEUTROABS 7.7  --   --   --   --   --   HGB 16.5 14.3 12.7* 13.3 12.4* 13.3  HCT 49.1 43.4 39.7 41.1 37.6* 38.8*  MCV 90.6 91.8 92.5 92.2 90.2 88.8  PLT 208 201 160 162 156 218   Cardiac Enzymes: No results for input(s): CKTOTAL, CKMB, CKMBINDEX, TROPONINI in the last 168 hours. BNP (last 3 results) No results for input(s): BNP in the last 8760 hours.  ProBNP (last 3 results)  No results for input(s): PROBNP in the last 8760 hours.  CBG: No results for input(s): GLUCAP in the last 168 hours.  Recent Results (from the past 240 hour(s))  Surgical pcr screen     Status: None   Collection Time: 09/30/14 10:01 AM  Result Value Ref Range Status   MRSA, PCR NEGATIVE NEGATIVE Final   Staphylococcus aureus NEGATIVE NEGATIVE Final    Comment:        The Xpert SA Assay (FDA approved for NASAL specimens in patients over 59 years of age), is one component of a comprehensive surveillance program.  Test performance has been validated  by [facility] for patients greater than or equal to 59 year old. It is not intended to diagnose infection nor to guide or monitor treatment.      Studies: No results found.  Scheduled Meds: . antiseptic oral rinse  7 mL Mouth Rinse BID  . budesonide  9 mg Oral Daily  . enoxaparin (LOVENOX) injection  40 mg Subcutaneous Q24H  . pantoprazole  40 mg Oral BID   Continuous Infusions:    Principal Problem:   SBO (small bowel obstruction) Active Problems:   HYPERCHOLESTEROLEMIA   Regional enteritis (Crohn's disease)   Diverticulosis   S/P small bowel resection    Time spent: [place] MD [facility] Pager [phone]. If 7PM-7AM, please contact night-coverage at [email or web address], password [facility] 10/05/2014, 5:05 PM  LOS: 6 days

## 2014-10-05 NOTE — Progress Notes (Signed)
Patient discharged home, all discharge medications and instructions reviewed and questions answered.  Patient declined wheelchair assistance to vehicle, states will ambulate.

## 2014-10-05 NOTE — Progress Notes (Signed)
The patient is receiving Protonix by the intravenous route.  Based on criteria approved by the Pharmacy and [place], the medication is being converted to the equivalent oral dose form.  These criteria include: -No Active GI bleeding -Able to tolerate diet of full liquids (or better) or tube feeding -Able to tolerate other medications by the oral or enteral route  If you have any questions about this conversion, please contact the Pharmacy Department (phone [phone]).  Thank you.  Minda Ditto PharmD Pager [phone] 10/05/2014, 11:54 AM

## 2014-10-05 NOTE — Discharge Summary (Signed)
Physician Discharge Summary  Terry Kerr QHU:[id number] DOB: 11-04-1955 DOA: 09/29/2014  PCP: Marton Redwood, MD  Admit date: 09/29/2014 Discharge date: 10/05/2014  Time spent: 65 minutes  Recommendations for Outpatient Follow-up:  1. Follow-up with Dr. Hilarie Fredrickson of gastroenterology as outpatient. 2. Follow-up with Dr. Rosendo Gros of general surgery. 3. Follow up with Marton Redwood, MD, in 1 week. Will need BMET on follow up.  Discharge Diagnoses:  Principal Problem:   SBO (small bowel obstruction) Active Problems:   HYPERCHOLESTEROLEMIA   Regional enteritis (Crohn's disease)   Diverticulosis   S/P small bowel resection   Abdominal pain   Discharge Condition: Stable and improved.  Diet recommendation: Regular soft diet.  Filed Weights   09/29/14 1100  Weight: 85.276 kg (188 lb)    History of present illness:  59 y/o with history of Crohn's since 1980,He was recently evaluated by Dr. Hilarie Fredrickson in January 2016 to establish care. His last colonoscopy at that time had been in 2010 by Dr. Sharlett Iles.06/26/2014 he had another colonoscopy which noted granular mucosa at the cecal base .Capsule endoscopy was performed on 09/10/2014 and revealed mild active Crohn's enteritis with ulcerations, erosions, and edema, and Biologics were recommended. Patient was seen in the office of Dr Hilarie Fredrickson on April 7 which time he reported that he was feeling unwell with the symptoms of abdominal discomfort. He had no nausea or vomiting. One day prior to admission, the patient had roasted beets and sweet potatoes for lunch and shortly thereafter began to experience abdominal distention and pain. His last bowel movement was the day prior to admission. He vomited the night prior to admission. Patient called the GI office and was advised to go to the [facility] long emergency room for evaluation. CT performed showed small bowel obstruction with distended small bowel loops and air-fluid levels in the mid lower abdomen and pelvis the  transition point was at the level of the ingested capsule. Small bowel distal to the capsule was small in caliber.  Hospital Course:  #1 small bowel obstruction secondary to small bowel stricture secondary to Crohn's disease with retained capsule Patient was admitted with small bowel obstruction noted on CT of the abdomen and pelvis felt to be secondary to small bowel stricture secondary to Crohn's disease with retained capsule. Patient was admitted placed on bowel rest placed on IV fluids pain management and supportive care. GI consultation was obtained. Patient was also seen in consultation by general surgery. Patient had no improvement by conservative means and subsequently underwent laparoscopy with exploratory laparoscopy with small bowel resection and anastomosis per Dr.Ramirez of general surgery. Patient was subsequently placed on the bowel rest and NG tube put in. Patient had some abdominal pain which improved. NG tube was removed. Patient was started on clear liquids which he tolerated his diet was subsequently advanced to full liquids. Patient started having flatus and also bowel movement. Patient's diet was further advanced and was tolerating a soft diet by day of discharge. Patient improved clinically and was started on Entocort which she tolerated. Patient will be discharged home in stable and improved condition and will follow-up with general surgery and gastroenterology as outpatient. with clinical improvement. Patient tolerating full liquids and diet has been advanced to soft diet per surgery.   #2 Crohn's disease Patient was admitted with a small bowel obstruction secondary to small bowel stricture secondary to Crohn's disease with a retained capsule. Patient's medications were held. Patient was kept nothing by mouth. Patient subsequently underwent surgery. Patient improved clinically diet was  advanced and was tolerating a soft diet without any abdominal pain nausea vomiting. Patient was  passing gas and having a bowel movement. Patient was started back on his Entocort 9 mg daily will follow-up with GI as outpatient.    Procedures:  Abdominal x-rays 09/29/2014,09/30/2014  Diagnostic laparoscopy with exploratory laparoscopy with small bowel resection and anastomosis per Dr. Rosendo Gros 09/30/2014  Consultations:  Gen. Surgery: Dr. Rosendo Gros 09/29/2014  Gastroenterology: Dr.  in Isabel 09/29/2014  Discharge Exam: Filed Vitals:   10/05/14 1353  BP: 133/72  Pulse: 70  Temp: 98.2 F (36.8 C)  Resp: 16    General: NAD Cardiovascular: RRR Respiratory: CTAB  Discharge Instructions   Discharge Instructions    Diet general    Complete by:  As directed      Discharge instructions    Complete by:  As directed   Follow up with Marton Redwood, MD in 1 week. Follow up with Dr Hilarie Fredrickson as scheduled. Follow up with Dr Rosendo Gros     Increase activity slowly    Complete by:  As directed           Current Discharge Medication List    START taking these medications   Details  budesonide (ENTOCORT EC) 3 MG 24 hr capsule Take 3 capsules (9 mg total) by mouth daily. Qty: 90 capsule, Refills: 0    oxyCODONE-acetaminophen (PERCOCET/ROXICET) 5-325 MG per tablet Take 1-2 tablets by mouth every 4 (four) hours as needed for moderate pain. Qty: 20 tablet, Refills: 0      CONTINUE these medications which have NOT CHANGED   Details  atorvastatin (LIPITOR) 40 MG tablet Take 40 mg by mouth daily.    Cholecalciferol (VITAMIN D) 2000 UNITS tablet Take 2,000 Units by mouth daily.    fexofenadine (ALLEGRA) 30 MG tablet Take 30 mg by mouth daily as needed (for allergies).     hyoscyamine (LEVSIN SL) 0.125 MG SL tablet Place 0.125 mg under the tongue 3 (three) times daily as needed for cramping.      STOP taking these medications     budesonide (ENTOCORT EC) 3 MG 24 hr capsule        No Known Allergies Follow-up Information    Follow up with PYRTLE, Lajuan Lines, MD. Schedule an  appointment as soon as possible for a visit in 3 weeks.   Specialty:  Gastroenterology   Contact information:   [address] [place] [address] [phone]       Follow up with Reyes Ivan, MD On 10/14/2014.   Specialty:  General Surgery   Why:  f/u at 1015am for check in.   Contact information:   [place] [address] [phone]       Follow up with Marton Redwood, MD. Schedule an appointment as soon as possible for a visit in 1 week.   Specialty:  Internal Medicine   Contact information:   [place] [address] [phone]       Follow up with Jerene Bears, MD On 10/12/2014.   Specialty:  Gastroenterology   Why:  Pt has a f/u appt on Wed May 16 at 09:45 previously scheduled   Contact information:   [address] [place] [address] [phone]       Please follow up.   Why:  Was not able to make a f/u appt. Was prompted to a voice mail at 14:30 on Monday May 9. Left a message for the  scheduler to return the call  to the nursing secretary on second shift.       The results of significant diagnostics from this hospitalization (including imaging, microbiology, ancillary and laboratory) are listed below for reference.    Significant Diagnostic Studies: Abd 1 View (kub)  09/29/2014   CLINICAL DATA:  Small-bowel obstruction  EXAM: ABDOMEN - 1 VIEW  COMPARISON:  CT from earlier in the same day.  FINDINGS: There are changes consistent with small bowel obstruction similar to that seen on the recent CT examination secondary to an endoscopy capsule lodged within an area of narrowing in the mid abdomen. More distal small bowel and colon is decompressed. Contrast material distends the urinary bladder. No bony abnormality is seen.  IMPRESSION: Changes consistent with small bowel obstruction secondary to an endoscopy capsule in an area of small bowel narrowing. The findings are similar to that seen on recent CT examination.    Electronically Signed   By: Inez Catalina M.D.   On: 09/29/2014 16:33   Dg Abd 1 View  09/18/2014   ADDENDUM REPORT: 09/18/2014 11:31  ADDENDUM: The impression in the report above is erroneous. In fact the electronic capsule is evident and lies over the T12 vertebral body likely in the transverse portion of the colon. It was inapparent initially due to technical factors and inadequate windowing on my part. I apologize for the confusion this has caused.   Electronically Signed   By: David  Martinique M.D.   On: 09/18/2014 11:31   09/18/2014   CLINICAL DATA:  Follow-up Givens capsule ingestion  EXAM: ABDOMEN - 1 VIEW  COMPARISON:  Abdominal series of September 03, 2014  FINDINGS: The previously demonstrated electronic capsule in the right upper quadrant is no longer evident. It is not visible elsewhere. There is a stable 3 mm diameter calcification over the lower pole of the left kidney. The bony structures exhibit no acute abnormalities.  IMPRESSION: The electronic capsule is not evident on today's study. A small portion of the ascending colon is excluded from the study however.  Electronically Signed: By: David  Martinique M.D. On: 09/18/2014 10:17   Ct Abdomen Pelvis W Contrast  09/29/2014   CLINICAL DATA:  Abdominal pain, nausea and bloating, endoscopy capsule 6 weeks ago, did not expelled the capsule.  EXAM: CT ABDOMEN AND PELVIS WITH CONTRAST  TECHNIQUE: Multidetector CT imaging of the abdomen and pelvis was performed using the standard protocol following bolus administration of intravenous contrast.  CONTRAST:  100 cc Omnipaque  COMPARISON:  Abdominal x-ray 09/18/2014  FINDINGS: Lung bases are unremarkable. Sagittal images of the spine shows mild degenerative changes lower thoracic spine. Enhanced liver shows no biliary ductal dilatation. Multiple hepatic cysts are noted. The largest cyst in right hepatic lobe measures 4.3 cm. The largest cyst in left hepatic lobe measures 2.5 cm. No calcified gallstones are noted  within gallbladder. The pancreas, spleen and adrenal glands are unremarkable. Kidneys are symmetrical in size and enhancement. No hydronephrosis or hydroureter. There is a cyst in midpole anterior aspect of the left kidney measures 2.2 cm. Cyst in midpole of the left kidney posterior aspect measures 1.2 cm.  Delayed renal images shows bilateral renal symmetrical excretion. Bilateral visualized proximal ureter is unremarkable. There is oral contrast material within stomach and proximal small bowel. There are fluid distended small bowel loops in mid and lower abdomen. Significant fluid distended small bowel loops in mid pelvis with air-fluid levels. Axial image 66 there is markedly distended small bowel loop in  mid lower abdomen up to 5 cm. Fecal like material noted in a segment of small bowel which measures about 9 cm length. The obstruction is at the level of endoscopy capsule which is visualized in axial image 66 measures 1.6 cm. Beyond the capsule the small bowel is small caliber. The terminal ileum is decompressed. No any contrast material is noted within distal small bowel or within right colon. Prostate gland and seminal vesicles are unremarkable. There is tiny amount of mesenteric fluid adjacent to distended small bowel loop in axial image 55. Small nonspecific mesenteric lymph nodes are noted in right lower quadrant mesentery.  The patient is status post appendectomy. There is no pericecal inflammation. No inguinal adenopathy.  IMPRESSION: 1. There is small bowel obstruction with distended small bowel loops and air-fluid levels in mid lower abdomen and pelvis. The transition point in caliber is at the level of ingested capsule best seen in axial image 66. There is distension of proximal small bowel with fecal like material just above the capsule. The distal small bowel is decompressed small caliber. 2. No pericecal inflammation. The patient is status post appendectomy. 3. No hydronephrosis or hydroureter. 4.  Hepatic cysts are noted. No intrahepatic biliary ductal dilatation.  These results were called by telephone at the time of interpretation on 09/29/2014 at 10:30 am to Dr. Joseph Berkshire , who verbally acknowledged these results.   Electronically Signed   By: Lahoma Crocker M.D.   On: 09/29/2014 10:33   Dg Abd Portable 1v  10/01/2014   CLINICAL DATA:  Small bowel obstruction  EXAM: PORTABLE ABDOMEN - 1 VIEW  COMPARISON:  09/30/2014  FINDINGS: The nasogastric tube tip is in the stomach. There is enteric contrast throughout the colon. There is mild mid abdominal small bowel dilatation which is unchanged from 09/30/2014 but substantially improved from 09/29/2014. There is no free intraperitoneal air.  IMPRESSION: Mild residual mid abdominal small bowel dilatation, unchanged.   Electronically Signed   By: Andreas Newport M.D.   On: 10/01/2014 05:38   Dg Abd Portable 1v  09/30/2014   CLINICAL DATA:  Status small bowel resection for obstruction.  EXAM: PORTABLE ABDOMEN - 1 VIEW  COMPARISON:  None.  FINDINGS: Small bowel dilatation seen on the comparison examination has resolved. Contrast material from the patient's CT scan is now seen in a decompressed colon. Surgical staples in the left upper quadrant and midline of the low abdomen are noted. Endoscopy capsule is again seen and projects slightly lower in the pelvis than on the prior study.  IMPRESSION: Small bowel obstruction appears resolved.  NG tube in good position.  Endoscopy capsule appears to have progressed through small bowel somewhat.   Electronically Signed   By: Inge Rise M.D.   On: 09/30/2014 16:07   Dg C-arm 1-60 Min-no Report  09/30/2014   CLINICAL DATA: surgery   C-ARM 1-60 MINUTES  Fluoroscopy was utilized by the requesting physician.  No radiographic  interpretation.     Microbiology: Recent Results (from the past 240 hour(s))  Surgical pcr screen     Status: None   Collection Time: 09/30/14 10:01 AM  Result Value Ref Range Status    MRSA, PCR NEGATIVE NEGATIVE Final   Staphylococcus aureus NEGATIVE NEGATIVE Final    Comment:        The Xpert SA Assay (FDA approved for NASAL specimens in patients over 50 years of age), is one component of a comprehensive surveillance program.  Test performance has been validated by [facility] for patients greater than or equal to 51 year old. It is not intended to diagnose infection nor to guide or monitor treatment.      Labs: Basic Metabolic Panel:  Recent Labs Lab 09/29/14 0809 09/30/14 0429 10/01/14 0437 10/02/14 0415 10/03/14 0350 10/05/14 0428  NA 137 141 139 139 138 139  K 4.1 4.1 4.6 3.7 3.7 3.8  CL 105 108 106 106 111 111  CO2 27 28 29 26 22 25   GLUCOSE 113* 116* 107* 93 83 100*  BUN 17 22* 17 13 14 9   CREATININE 0.97 1.01 0.92 0.89 0.76 0.86  CALCIUM 9.1 8.3* 8.0* 7.9* 7.8* 8.4*  MG 2.1 2.0 2.1 1.9 2.1  --    Liver Function Tests:  Recent Labs Lab 09/29/14 0809 09/30/14 0429 10/03/14 0350  AST 14* 14* 11*  ALT 18 14* 13*  ALKPHOS 65 50 40  BILITOT 1.6* 0.8 1.3*  PROT 7.2 5.9* 5.4*  ALBUMIN 4.1 3.3* 2.7*    Recent Labs Lab 09/29/14 0809  LIPASE 23   No results for input(s): AMMONIA in the last 168 hours. CBC:  Recent Labs Lab 09/29/14 0809 09/30/14 0429 10/01/14 0437 10/02/14 0415 10/03/14 0350 10/05/14 0428  WBC 9.0 8.3 6.8 7.5 6.4 5.5  NEUTROABS 7.7  --   --   --   --   --   HGB 16.5 14.3 12.7* 13.3 12.4* 13.3  HCT 49.1 43.4 39.7 41.1 37.6* 38.8*  MCV 90.6 91.8 92.5 92.2 90.2 88.8  PLT 208 201 160 162 156 218   Cardiac Enzymes: No results for input(s): CKTOTAL, CKMB, CKMBINDEX, TROPONINI in the last 168 hours. BNP: BNP (last 3 results) No results for input(s): BNP in the last 8760 hours.  ProBNP (last 3 results) No results for input(s): PROBNP in the last 8760 hours.  CBG: No results for input(s): GLUCAP in the last 168 hours.     SignedIrine Seal MD [facility] 10/05/2014, 6:00 PM

## 2014-10-12 ENCOUNTER — Telehealth: Payer: Self-pay | Admitting: Internal Medicine

## 2014-10-12 ENCOUNTER — Encounter: Payer: Self-pay | Admitting: Internal Medicine

## 2014-10-12 ENCOUNTER — Ambulatory Visit (INDEPENDENT_AMBULATORY_CARE_PROVIDER_SITE_OTHER): Payer: Federal, State, Local not specified - PPO | Admitting: Internal Medicine

## 2014-10-12 VITALS — BP 110/70 | HR 68 | Ht 67.75 in | Wt 172.0 lb

## 2014-10-12 DIAGNOSIS — K50912 Crohn's disease, unspecified, with intestinal obstruction: Secondary | ICD-10-CM | POA: Diagnosis not present

## 2014-10-12 DIAGNOSIS — Z79899 Other long term (current) drug therapy: Secondary | ICD-10-CM | POA: Diagnosis not present

## 2014-10-12 MED ORDER — HYOSCYAMINE SULFATE 0.125 MG SL SUBL: 0.1250 mg | SUBLINGUAL_TABLET | SUBLINGUAL | Status: DC | PRN

## 2014-10-12 NOTE — Progress Notes (Signed)
Subjective:    Patient ID: Terry Kerr, male    DOB: 11-17-1955, 59 y.o.   MRN: [id number]  HPI Gerhard Rappaport is a 59 year old male with past medical history of Crohn's (primarily small bowel with some right colon involvement) diagnosed in 1980 with recent admission for small bowel obstruction requiring segmental small bowel resection who seen for follow-up. He is here today with his wife. He recently presented to the emergency department with small bowel obstruction, felt secondary to retained video capsule. He was treated initially conservatively but eventually had 26 cm of small bowel removed laparoscopically. He was discharged from hospital 1 week ago. 2 strictures were seen on pathology with creeping fat consistent with Crohn's disease. He had primary small bowel anastomosis.  He has had no pain since discharge from the hospital. He is not using pain medications. He has been eating small low residue meals. He is experiencing fatigue and decreased stamina. Bowel movements a been regular without blood or melena. Appetite has been coming back slowly. No nausea or vomiting. He has been continuing on budesonide 9 mg daily. He has Levsin which he rarely uses for cramping. He reports one day he felt a "twinge" of right lower discomfort but this lasted only a few minutes and resolved.  He was planning a trip to [place] along with a [place] cruise in late May which they have canceled. He has paperwork today for trip insurance and also try to recuperate frequent flyer miles.  No fevers or chills. He has follow-up with primary care tomorrow and his surgeon on Wednesday  Review of Systems As per history of present illness, otherwise negative  Current Medications, Allergies, Past Medical History, Past Surgical History, Family History and Social History were reviewed in [employer] record.     Objective:   Physical Exam BP 110/70 mmHg  Pulse 68  Ht 5' 7.75" (1.721 m)  Wt 172  lb (78.019 kg)  BMI 26.34 kg/m2 Constitutional: Well-developed and well-nourished. No distress. HEENT: Normocephalic and atraumatic.Conjunctivae are normal.  No scleral icterus. Neck: Neck supple. Trachea midline. Cardiovascular: Normal rate, regular rhythm and intact distal pulses. No M/R/G Pulmonary/chest: Effort normal and breath sounds normal. No wheezing, rales or rhonchi. Abdominal: Soft, 2 left upper quadrant incisions with one staple each well healing, midline above the umbilicus scar with packing gauze and intact staples without significant surrounding erythema or tenderness, nontender, nondistended. Bowel sounds active throughout.  Extremities: no clubbing, cyanosis, or edema Neurological: Alert and oriented to person place and time. Psychiatric: Normal mood and affect. Behavior is normal.  CBC    Component Value Date/Time   WBC 5.5 10/05/2014 0428   RBC 4.37 10/05/2014 0428   HGB 13.3 10/05/2014 0428   HCT 38.8* 10/05/2014 0428   PLT 218 10/05/2014 0428   MCV 88.8 10/05/2014 0428   MCH 30.4 10/05/2014 0428   MCHC 34.3 10/05/2014 0428   RDW 13.0 10/05/2014 0428   LYMPHSABS 0.5* 09/29/2014 0809   MONOABS 0.8 09/29/2014 0809   EOSABS 0.0 09/29/2014 0809   BASOSABS 0.0 09/29/2014 0809   Surgical pathology from recent small bowel resection reviewed TB and viral hep panels negative    Assessment & Plan:  59 year old male with past medical history of Crohn's (primarily small bowel with some right colon involvement) diagnosed in 1980 with recent admission for small bowel obstruction requiring segmental small bowel resection who seen for follow-up.  1. Crohn's disease with obstruction status post resection -- he is recovering well after  small bowel resection for active Crohn's disease with strictures. I expect more time as needed for his stamina and energy levels to return to normal. Discontinue budesonide. We had another discussion today with both he and his wife regarding  initiation of biologic therapy, likely with Humira, once he is cleared by Dr. Rosendo Gros. We discussed how patients to our started on biologic therapy after resection have lower risk of recurrence over time. We again discussed the risks, benefits and alternatives to biologic therapy, and he agrees to proceed. Prior to initiating biologic therapy I have recommended vaccination with both Pneumovax if not already given and the shingles vaccine. Will plan Humira initiation once his wounds have healed completely and released from surgery. He can slowly advance his diet to regular foods. --Time spent today completing insurance form for travel and a letter to his airline --25 minutes spent with the patient and his wife today --He has follow-up on 11/02/2014, though this may be delayed or rescheduled depending on Humira dosing

## 2014-10-12 NOTE — Patient Instructions (Signed)
Please discontinue Entocort.  We have sent the following medications to your pharmacy for you to pick up at your convenience: Levsin 0.125 mg-Take 1 tablet every 4 hours as needed  Please have your primary care doctor to fax [place] labwork that is done tomorrow. Our fax number is [phone] (attn: Dottie/Linda).  Please discuss shingles vaccine and pneumovax vaccine with Dr Brigitte Pulse.  Keep your appointment with Dr Hilarie Fredrickson on 11/02/14 for now. We may cancel this appointment in the future if indicated.  Please notify our office once Dr Rosendo Gros has released you. At that time, we will discuss starting your Humira.

## 2014-10-12 NOTE — Telephone Encounter (Signed)
Pt wants to know when forms will be ready to pick up, he dropped them off today. Please advise.

## 2014-10-13 ENCOUNTER — Encounter: Payer: Self-pay | Admitting: Internal Medicine

## 2014-10-13 NOTE — Telephone Encounter (Signed)
Should have it completed by 5pm today

## 2014-10-13 NOTE — Telephone Encounter (Signed)
Spoke with pt and he is aware.

## 2014-10-14 ENCOUNTER — Ambulatory Visit: Payer: Federal, State, Local not specified - PPO | Admitting: Internal Medicine

## 2014-11-02 ENCOUNTER — Ambulatory Visit: Payer: Federal, State, Local not specified - PPO | Admitting: Internal Medicine

## 2014-12-03 ENCOUNTER — Encounter: Payer: Self-pay | Admitting: Internal Medicine

## 2014-12-07 ENCOUNTER — Encounter: Payer: Self-pay | Admitting: Gastroenterology

## 2014-12-09 ENCOUNTER — Telehealth: Payer: Self-pay | Admitting: Internal Medicine

## 2014-12-09 NOTE — Telephone Encounter (Signed)
Discussed 5.00 copay card with pts wife, she was given phone number to call and make sure this will work with their insurance.

## 2014-12-22 ENCOUNTER — Telehealth: Payer: Self-pay | Admitting: Internal Medicine

## 2014-12-22 DIAGNOSIS — Z111 Encounter for screening for respiratory tuberculosis: Secondary | ICD-10-CM

## 2014-12-22 NOTE — Telephone Encounter (Signed)
Left message for patient to call back  

## 2014-12-23 NOTE — Telephone Encounter (Signed)
Ok to resume Humira and then follow-up with me I am okay restarting Humira before OV

## 2014-12-23 NOTE — Telephone Encounter (Signed)
Pts wife called and states that pt is ready to start on Humira. Paperwork initiated and [facility]ed to [facility]. Does pt need an OV with you prior to starting Humira? Please advise.

## 2014-12-24 NOTE — Telephone Encounter (Signed)
He has not started the Humira yet his wife called and states that he is ready to start.

## 2014-12-24 NOTE — Telephone Encounter (Signed)
We have had long discussion about Biologics previously I'm okay for him to start before seeing me again as long as we have documentation of negative TB testing and viral hepatitis serologies

## 2014-12-24 NOTE — Telephone Encounter (Signed)
Labs were done at hospital 09/29/14. Results in epic.

## 2014-12-25 ENCOUNTER — Ambulatory Visit (INDEPENDENT_AMBULATORY_CARE_PROVIDER_SITE_OTHER): Payer: Federal, State, Local not specified - PPO | Admitting: Internal Medicine

## 2014-12-25 ENCOUNTER — Telehealth: Payer: Self-pay | Admitting: *Deleted

## 2014-12-25 DIAGNOSIS — Z111 Encounter for screening for respiratory tuberculosis: Secondary | ICD-10-CM | POA: Diagnosis not present

## 2014-12-25 NOTE — Telephone Encounter (Signed)
Patient will come today for tb skin test.

## 2014-12-25 NOTE — Telephone Encounter (Signed)
Needs to come in for ppd, document negative, before starting Humira

## 2014-12-25 NOTE — Telephone Encounter (Signed)
Error

## 2014-12-25 NOTE — Telephone Encounter (Signed)
See 09/29/14 quantiferon gold and advise.... We need to send rx to [facility] once advised.Marland KitchenMarland Kitchen

## 2014-12-25 NOTE — Telephone Encounter (Signed)
Left message for patient to call back  

## 2014-12-28 LAB — TB SKIN TEST
INDURATION: 0 mm
TB Skin Test: NEGATIVE

## 2014-12-28 MED ORDER — HUMIRA-CD/UC/HS STARTER 40 MG/0.8ML ~~LOC~~ PNKT: 160.0000 mg | PEN_INJECTOR | Freq: Once | SUBCUTANEOUS | Status: DC

## 2014-12-28 MED ORDER — ADALIMUMAB 40 MG/0.8ML ~~LOC~~ AJKT: 40.0000 mg | AUTO-INJECTOR | SUBCUTANEOUS | Status: DC

## 2014-12-28 NOTE — Addendum Note (Signed)
Addended by: Larina Bras on: 12/28/2014 04:56 PM   Modules accepted: Orders

## 2014-12-28 NOTE — Telephone Encounter (Signed)
TB test was negative. I have sent Humira to patient's pharmacy.

## 2015-02-19 ENCOUNTER — Telehealth: Payer: Self-pay | Admitting: Internal Medicine

## 2015-02-22 NOTE — Telephone Encounter (Signed)
Spoke with pt and let him know taking the Humira a day or 2 late was no problem and that he can have the flu shot. Pt verbalized understanding.

## 2015-04-01 ENCOUNTER — Other Ambulatory Visit: Payer: Self-pay | Admitting: Internal Medicine

## 2015-04-29 ENCOUNTER — Other Ambulatory Visit: Payer: Self-pay | Admitting: Internal Medicine

## 2015-06-01 ENCOUNTER — Other Ambulatory Visit: Payer: Self-pay | Admitting: Internal Medicine

## 2015-07-23 ENCOUNTER — Ambulatory Visit (INDEPENDENT_AMBULATORY_CARE_PROVIDER_SITE_OTHER): Payer: Federal, State, Local not specified - PPO | Admitting: Internal Medicine

## 2015-07-23 ENCOUNTER — Encounter: Payer: Self-pay | Admitting: Internal Medicine

## 2015-07-23 VITALS — BP 126/74 | HR 68 | Ht 67.75 in | Wt 194.1 lb

## 2015-07-23 DIAGNOSIS — Z09 Encounter for follow-up examination after completed treatment for conditions other than malignant neoplasm: Secondary | ICD-10-CM

## 2015-07-23 DIAGNOSIS — K50812 Crohn's disease of both small and large intestine with intestinal obstruction: Secondary | ICD-10-CM | POA: Diagnosis not present

## 2015-07-23 DIAGNOSIS — Z9229 Personal history of other drug therapy: Secondary | ICD-10-CM

## 2015-07-23 NOTE — Progress Notes (Signed)
   Subjective:    Patient ID: Terry Kerr, male    DOB: 12/18/1955, 60 y.o.   MRN: [id number]  HPI Terry Kerr is a 60 year old male with a past medical history of primarily ileal Crohn's with some very mild cecal involvement associated with small bowel stricture status post segmental small bowel resection in May 2016 who is here for follow-up. He is here today with his daughter. He has been maintained on Humira since summer 2016 after recovering from segmental small bowel resection. He was having quite frequent partial obstructive symptoms but now is feeling "great". He reports he feels better than he has in decades. This is reiterated by his daughter today. He is tolerating Humira very well with last dose 7 days ago. No injection site reactions or problems with obtaining the medication. Great appetite. He's gained about 22 pounds which he states he needs to watch. No abdominal pain. No diarrhea. No constipation. No blood in stool. No upper GI or hepatobiliary complaint.  Unfortunately his position was terminated in June 2016 but he was able to get a job quickly with a good company. 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   Review of Systems As per history of present illness, otherwise negative  Current Medications, Allergies, Past Medical History, Past Surgical History, Family History and Social History were reviewed in [employer] record.     Objective:   Physical Exam BP 126/74 mmHg  Pulse 68  Ht 5' 7.75" (1.721 m)  Wt 194 lb 2 oz (88.055 kg)  BMI 29.73 kg/m2 Constitutional: Well-developed and well-nourished. No distress. HEENT: Normocephalic and atraumatic.  Conjunctivae are normal.  No scleral icterus. Neck: Neck supple. Trachea midline. Cardiovascular: Normal rate, regular rhythm and intact distal pulses. No M/R/G Pulmonary/chest: Effort normal and breath sounds normal. No wheezing, rales or rhonchi. Abdominal: Soft, nontender,  nondistended. Bowel sounds active throughout. There are no masses palpable. No hepatosplenomegaly. Well-healed incision  Extremities: no clubbing, cyanosis, or edema Neurological: Alert and oriented to person place and time. Skin: Skin is warm and dry.  Psychiatric: Normal mood and affect. Behavior is normal.  Labs per primary care Annual quantiferon Gold    Assessment & Plan:   61 year old male with a past medical history of primarily ileal Crohn's with some very mild cecal involvement associated with small bowel stricture status post segmental small bowel resection in May 2016 who is here for follow-up.  1. Stricturing ileal Crohn's disease status post small bowel resection -- doing great after segmental resection in May 2016. Humira started postoperatively to try to prevent recurrence. He is tolerating the medication very well. No evidence of recurrence of Crohn's disease at this time.  IBD Health Care Maintenance: Annual Flu Vaccine - fall 2016  Pneumococcal Vaccine -- given 2016 Zoster vaccine -- given prior to Humira Skin check / derm eva; -- recommended TB testing -- skin test neg Aug 2016  One year follow-up, sooner if necessary 25 minutes spent with the patient today. Greater than 50% was spent in counseling and coordination of care with the patient

## 2015-07-23 NOTE — Patient Instructions (Signed)
Please continue Humira.  Follow up with Dr Hilarie Fredrickson in 1 year.

## 2015-07-26 ENCOUNTER — Other Ambulatory Visit: Payer: Self-pay | Admitting: Internal Medicine

## 2015-11-16 ENCOUNTER — Telehealth: Payer: Self-pay | Admitting: Internal Medicine

## 2015-11-22 NOTE — Telephone Encounter (Signed)
Left message to call back  

## 2015-11-23 NOTE — Telephone Encounter (Signed)
Prior auth obtained for Humira through 05/21/17, pt aware.

## 2015-11-23 NOTE — Telephone Encounter (Signed)
Left message to call back  

## 2015-11-25 NOTE — Telephone Encounter (Signed)
Humira has been approved from 09/24/15-05/21/2017

## 2016-01-07 ENCOUNTER — Other Ambulatory Visit: Payer: Self-pay | Admitting: Internal Medicine

## 2016-03-27 ENCOUNTER — Telehealth: Payer: Self-pay | Admitting: *Deleted

## 2016-03-27 ENCOUNTER — Other Ambulatory Visit: Payer: Federal, State, Local not specified - PPO

## 2016-03-27 ENCOUNTER — Other Ambulatory Visit: Payer: Self-pay

## 2016-03-27 DIAGNOSIS — Z79899 Other long term (current) drug therapy: Secondary | ICD-10-CM | POA: Diagnosis not present

## 2016-03-27 DIAGNOSIS — Z9189 Other specified personal risk factors, not elsewhere classified: Secondary | ICD-10-CM

## 2016-03-27 DIAGNOSIS — K5090 Crohn's disease, unspecified, without complications: Secondary | ICD-10-CM | POA: Diagnosis not present

## 2016-03-27 NOTE — Telephone Encounter (Signed)
Patient due for TB testing since he is on Humira. Patient states he will come today for testing.

## 2016-03-27 NOTE — Telephone Encounter (Signed)
Patient agrees to go for [employer] testing instead. Tubersol is expired.

## 2016-03-29 LAB — QUANTIFERON TB GOLD ASSAY (BLOOD)
INTERFERON GAMMA RELEASE ASSAY: NEGATIVE
Mitogen-Nil: >10 IU/mL
QUANTIFERON NIL VALUE: 0.01 [IU]/mL
Quantiferon Tb Ag Minus Nil Value: 0.01 IU/mL

## 2016-03-30 ENCOUNTER — Encounter: Payer: Self-pay | Admitting: *Deleted

## 2016-03-31 ENCOUNTER — Other Ambulatory Visit: Payer: Self-pay | Admitting: Internal Medicine

## 2016-04-18 ENCOUNTER — Telehealth: Payer: Self-pay | Admitting: Internal Medicine

## 2016-04-25 NOTE — Telephone Encounter (Signed)
I have spoken to [facility] who has added a primary diagnosis code of K50.90 (Crohns) and a secondary code of Z79.899 (long term immunosuppressive therapy). Brayton Layman states that she will run it back through [employer] carrier.

## 2016-05-03 DIAGNOSIS — Z0000 Encounter for general adult medical examination without abnormal findings: Secondary | ICD-10-CM | POA: Diagnosis not present

## 2016-05-03 DIAGNOSIS — R7301 Impaired fasting glucose: Secondary | ICD-10-CM | POA: Diagnosis not present

## 2016-05-03 DIAGNOSIS — Z125 Encounter for screening for malignant neoplasm of prostate: Secondary | ICD-10-CM | POA: Diagnosis not present

## 2016-05-05 ENCOUNTER — Telehealth: Payer: Self-pay | Admitting: Internal Medicine

## 2016-05-05 NOTE — Telephone Encounter (Signed)
Spoke with pt and let him know he did not have to stop his Humira for the flu shot.

## 2016-05-10 DIAGNOSIS — E784 Other hyperlipidemia: Secondary | ICD-10-CM | POA: Diagnosis not present

## 2016-05-10 DIAGNOSIS — Z6829 Body mass index (BMI) 29.0-29.9, adult: Secondary | ICD-10-CM | POA: Diagnosis not present

## 2016-05-10 DIAGNOSIS — R7301 Impaired fasting glucose: Secondary | ICD-10-CM | POA: Diagnosis not present

## 2016-05-10 DIAGNOSIS — Z1389 Encounter for screening for other disorder: Secondary | ICD-10-CM | POA: Diagnosis not present

## 2016-05-10 DIAGNOSIS — K5090 Crohn's disease, unspecified, without complications: Secondary | ICD-10-CM | POA: Diagnosis not present

## 2016-05-10 DIAGNOSIS — Z0000 Encounter for general adult medical examination without abnormal findings: Secondary | ICD-10-CM | POA: Diagnosis not present

## 2016-05-10 DIAGNOSIS — L98499 Non-pressure chronic ulcer of skin of other sites with unspecified severity: Secondary | ICD-10-CM | POA: Diagnosis not present

## 2016-05-16 DIAGNOSIS — Z1212 Encounter for screening for malignant neoplasm of rectum: Secondary | ICD-10-CM | POA: Diagnosis not present

## 2016-06-19 DIAGNOSIS — C44519 Basal cell carcinoma of skin of other part of trunk: Secondary | ICD-10-CM | POA: Diagnosis not present

## 2016-06-19 DIAGNOSIS — C4441 Basal cell carcinoma of skin of scalp and neck: Secondary | ICD-10-CM | POA: Diagnosis not present

## 2016-06-19 DIAGNOSIS — C44529 Squamous cell carcinoma of skin of other part of trunk: Secondary | ICD-10-CM | POA: Diagnosis not present

## 2016-06-19 DIAGNOSIS — C44212 Basal cell carcinoma of skin of right ear and external auricular canal: Secondary | ICD-10-CM | POA: Diagnosis not present

## 2016-06-23 ENCOUNTER — Other Ambulatory Visit: Payer: Self-pay | Admitting: Internal Medicine

## 2016-06-23 MED ORDER — ADALIMUMAB 40 MG/0.8ML ~~LOC~~ AJKT: 40.0000 mg | AUTO-INJECTOR | SUBCUTANEOUS | 2 refills | Status: DC

## 2016-06-23 MED ORDER — ADALIMUMAB 40 MG/0.8ML ~~LOC~~ AJKT: 40.0000 mg | AUTO-INJECTOR | SUBCUTANEOUS | 3 refills | Status: DC

## 2016-06-23 NOTE — Telephone Encounter (Signed)
Patient notified rx sent He states that this needs to go to [facility]

## 2016-06-26 ENCOUNTER — Other Ambulatory Visit: Payer: Self-pay

## 2016-06-26 ENCOUNTER — Telehealth: Payer: Self-pay | Admitting: Internal Medicine

## 2016-06-26 MED ORDER — ADALIMUMAB 40 MG/0.8ML ~~LOC~~ AJKT: 40.0000 mg | AUTO-INJECTOR | SUBCUTANEOUS | 3 refills | Status: DC

## 2016-06-26 NOTE — Telephone Encounter (Signed)
Humira script faxed to [facility] prime per pt request. Copay assistance card information and activation phone number given to pt over the phone.

## 2016-06-29 ENCOUNTER — Ambulatory Visit (INDEPENDENT_AMBULATORY_CARE_PROVIDER_SITE_OTHER): Payer: Federal, State, Local not specified - PPO | Admitting: Internal Medicine

## 2016-06-29 ENCOUNTER — Encounter: Payer: Self-pay | Admitting: Internal Medicine

## 2016-06-29 ENCOUNTER — Encounter (INDEPENDENT_AMBULATORY_CARE_PROVIDER_SITE_OTHER): Payer: Self-pay

## 2016-06-29 VITALS — BP 110/68 | HR 61 | Ht 67.75 in | Wt 192.0 lb

## 2016-06-29 DIAGNOSIS — K50812 Crohn's disease of both small and large intestine with intestinal obstruction: Secondary | ICD-10-CM | POA: Diagnosis not present

## 2016-06-29 NOTE — Progress Notes (Signed)
   Subjective:    Patient ID: Terry Kerr, male    DOB: 1956-05-21, 61 y.o.   MRN: [id number]  HPI Terry Kerr is a 61 year old male with a past medical history of ileocolonic Crohn's disease with stricture status post segmental small bowel resection in May 2016 is here for follow-up. He is here alone today. He's been maintained on Humira 40 mg every 14 days since summer 2016 after recovering from segmental small bowel resection.  He reports that he has been doing and feeling very well. He feels that he may have had one flare in the last 6-8 months which for him was some mild abdominal discomfort and fatigue. No obstructive symptoms. Bowel habits been regular occurring 1-2 times per day without blood or melena. He denies diarrhea and constipation. No trouble with Humira injection reaction. Appetite is good. No upper GI or hepatobiliary complaint. Work is been stressful for him. 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  He has been seen by dermatology and diagnosed with basal cell carcinoma behind his right ear and in the left neck region. He has Mohs micrographic surgery planned.  Review of Systems As per history of present illness, otherwise negative  Current Medications, Allergies, Past Medical History, Past Surgical History, Family History and Social History were reviewed in [employer] record.     Objective:   Physical Exam BP 110/68   Pulse 61   Ht 5' 7.75" (1.721 m)   Wt 192 lb (87.1 kg)   BMI 29.41 kg/m  Constitutional: Well-developed and well-nourished. No distress. HEENT: Normocephalic and atraumatic. Conjunctivae are normal.  No scleral icterus. Neck: Neck supple. Trachea midline. Cardiovascular: Normal rate, regular rhythm and intact distal pulses. No M/R/G Pulmonary/chest: Effort normal and breath sounds normal. No wheezing, rales or rhonchi. Abdominal: Soft, nontender, nondistended. Bowel sounds active throughout. There are no masses  palpable. No hepatosplenomegaly. Extremities: no clubbing, cyanosis, or edema Neurological: Alert and oriented to person place and time. Skin: Skin is warm and dry.  Psychiatric: Normal mood and affect. Behavior is normal.     Assessment & Plan:  61 year old male with a past medical history of ileocolonic Crohn's disease with stricture status post segmental small bowel resection in May 2016 is here for follow-up.  1. Crohn's ileocolitis status post segmental small bowel resection -- doing well clinically on Humira. We will continue Humira 40 mg every 14 days. I recommended surveillance colonoscopy this year however he would like to get through his Mohs surgery before considering surveillance colonoscopy. His colonic involvement has been minimal and there was no evidence of adenomatous polyps or dysplasia 2 years ago. After our discussion I am okay waiting an additional year prior to surveillance colonoscopy.  IBD Health Care Maintenance: Annual Flu Vaccine - fall 2017 Pneumococcal Vaccine -- given 2016 Zoster vaccine -- given prior to Humira Skin check / derm eva; -- ongoing TB testing -- quantiferon gold negative October 2017  15 minutes spent with the patient today. Greater than 50% was spent in counseling and coordination of care with the patient

## 2016-06-29 NOTE — Patient Instructions (Signed)
Follow up with Dr Hilarie Fredrickson in 1 year.  Continue Humira.  If you are age 61 or older, your body mass index should be between 23-30. Your Body mass index is 29.41 kg/m. If this is out of the aforementioned range listed, please consider follow up with your Primary Care Provider.  If you are age 67 or younger, your body mass index should be between 19-25. Your Body mass index is 29.41 kg/m. If this is out of the aformentioned range listed, please consider follow up with your Primary Care Provider.

## 2016-07-12 DIAGNOSIS — C44212 Basal cell carcinoma of skin of right ear and external auricular canal: Secondary | ICD-10-CM | POA: Diagnosis not present

## 2016-07-20 DIAGNOSIS — C44529 Squamous cell carcinoma of skin of other part of trunk: Secondary | ICD-10-CM | POA: Diagnosis not present

## 2016-08-14 DIAGNOSIS — C44519 Basal cell carcinoma of skin of other part of trunk: Secondary | ICD-10-CM | POA: Diagnosis not present

## 2016-08-14 DIAGNOSIS — C44612 Basal cell carcinoma of skin of right upper limb, including shoulder: Secondary | ICD-10-CM | POA: Diagnosis not present

## 2016-09-19 ENCOUNTER — Other Ambulatory Visit: Payer: Self-pay | Admitting: Internal Medicine

## 2016-10-27 DIAGNOSIS — C44619 Basal cell carcinoma of skin of left upper limb, including shoulder: Secondary | ICD-10-CM | POA: Diagnosis not present

## 2016-10-27 DIAGNOSIS — C4441 Basal cell carcinoma of skin of scalp and neck: Secondary | ICD-10-CM | POA: Diagnosis not present

## 2016-10-27 DIAGNOSIS — D2239 Melanocytic nevi of other parts of face: Secondary | ICD-10-CM | POA: Diagnosis not present

## 2016-10-27 DIAGNOSIS — C44519 Basal cell carcinoma of skin of other part of trunk: Secondary | ICD-10-CM | POA: Diagnosis not present

## 2016-10-27 DIAGNOSIS — C44612 Basal cell carcinoma of skin of right upper limb, including shoulder: Secondary | ICD-10-CM | POA: Diagnosis not present

## 2016-10-27 DIAGNOSIS — D485 Neoplasm of uncertain behavior of skin: Secondary | ICD-10-CM | POA: Diagnosis not present

## 2016-10-31 DIAGNOSIS — Z111 Encounter for screening for respiratory tuberculosis: Secondary | ICD-10-CM | POA: Diagnosis not present

## 2016-10-31 DIAGNOSIS — Z0000 Encounter for general adult medical examination without abnormal findings: Secondary | ICD-10-CM | POA: Diagnosis not present

## 2016-11-15 DIAGNOSIS — C4441 Basal cell carcinoma of skin of scalp and neck: Secondary | ICD-10-CM | POA: Diagnosis not present

## 2016-11-15 DIAGNOSIS — C44519 Basal cell carcinoma of skin of other part of trunk: Secondary | ICD-10-CM | POA: Diagnosis not present

## 2016-11-15 DIAGNOSIS — D225 Melanocytic nevi of trunk: Secondary | ICD-10-CM | POA: Diagnosis not present

## 2016-11-15 DIAGNOSIS — C44619 Basal cell carcinoma of skin of left upper limb, including shoulder: Secondary | ICD-10-CM | POA: Diagnosis not present

## 2016-12-11 IMAGING — CR DG ABDOMEN 1V
1 series · 1 of 1 positions shown · non-contrast
Comparison: Abdominal series of September 03, 2014

ADDENDUM:
The impression in the report above is erroneous. In fact the
electronic capsule is evident and lies over the T12 vertebral body
likely in the transverse portion of the colon. It was inapparent
initially due to technical factors and inadequate windowing on my
part. I apologize for the confusion this has caused.
CLINICAL DATA: Follow-up [facility] capsule ingestion

EXAM:
ABDOMEN - 1 VIEW

[view not recorded]
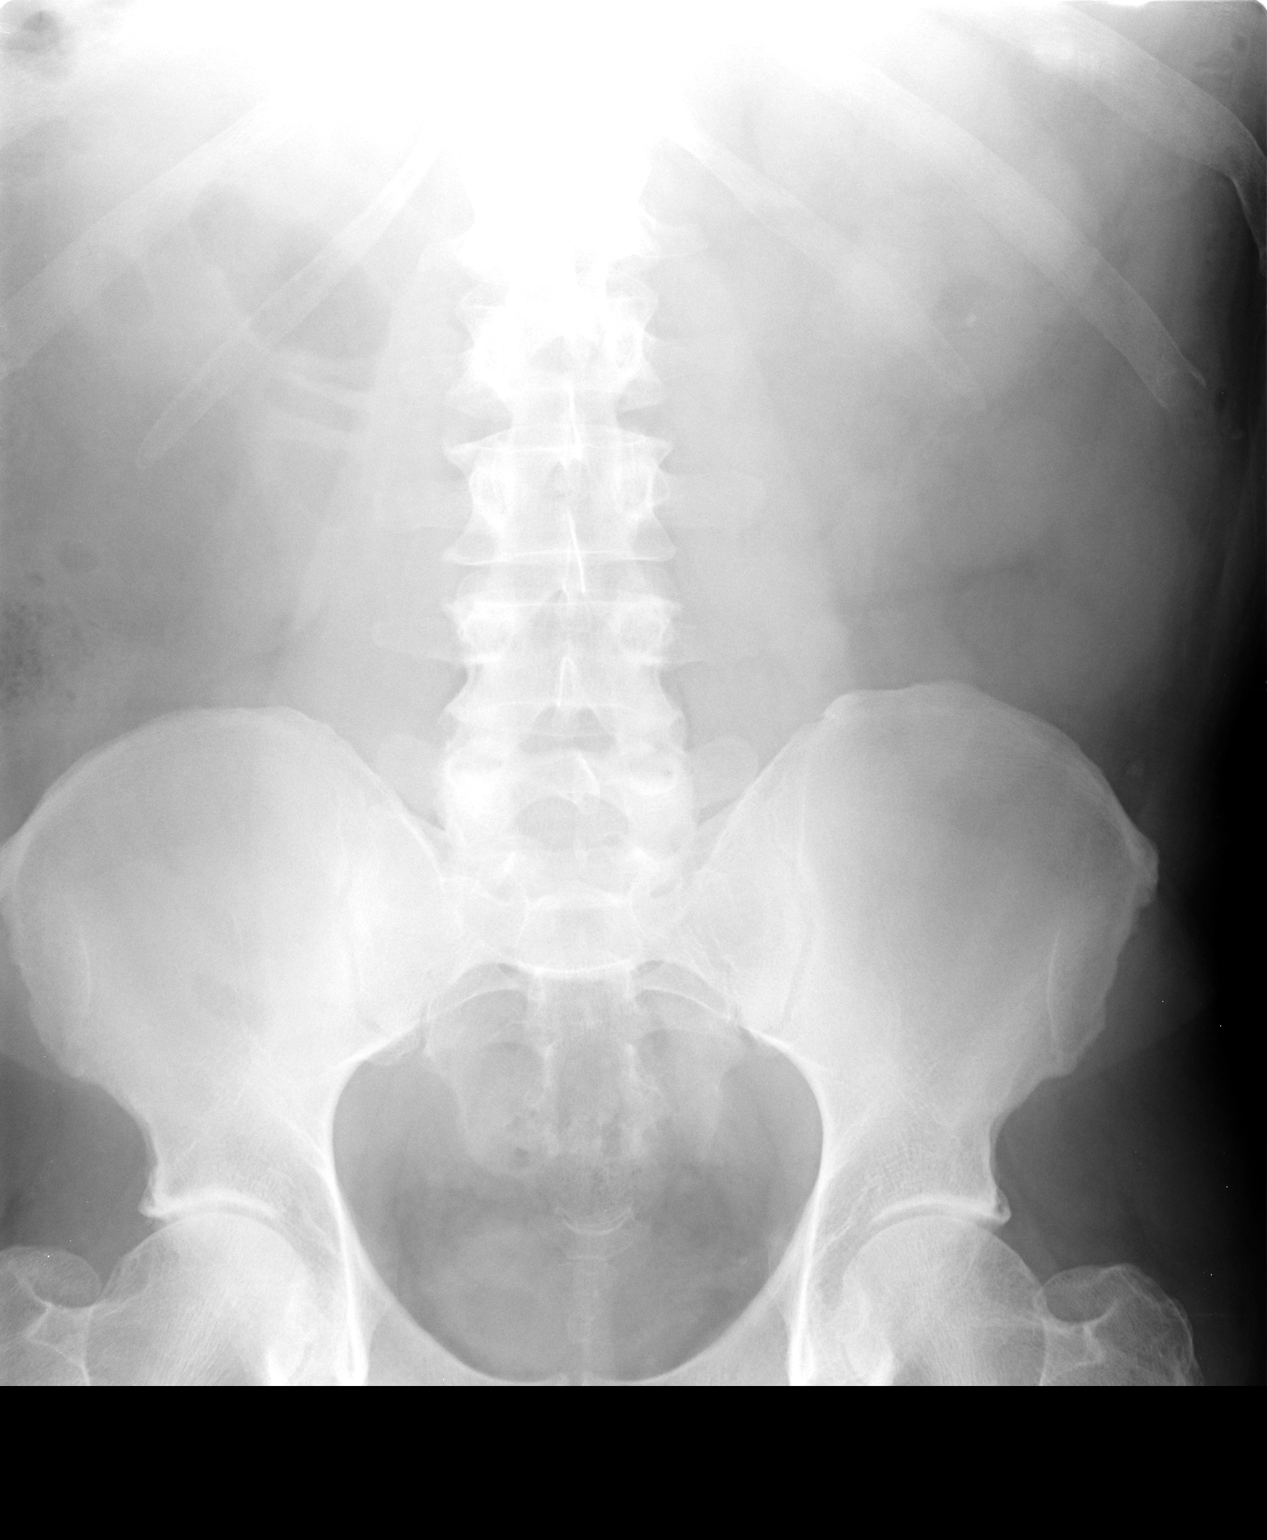

[1 of 1 positions shown; findings below may reference images not displayed]

FINDINGS: The previously demonstrated electronic capsule in the right upper
quadrant is no longer evident. It is not visible elsewhere. There is
a stable 3 mm diameter calcification over the lower pole of the left
kidney. The bony structures exhibit no acute abnormalities.
IMPRESSION: The electronic capsule is not evident on today's study. A small
portion of the ascending colon is excluded from the study however.

## 2017-01-23 ENCOUNTER — Other Ambulatory Visit: Payer: Self-pay | Admitting: Internal Medicine

## 2017-01-23 ENCOUNTER — Other Ambulatory Visit: Payer: Self-pay

## 2017-01-23 MED ORDER — ADALIMUMAB 40 MG/0.8ML ~~LOC~~ AJKT: 40.0000 mg | AUTO-INJECTOR | SUBCUTANEOUS | 6 refills | Status: DC

## 2017-01-24 ENCOUNTER — Other Ambulatory Visit: Payer: Self-pay

## 2017-01-24 MED ORDER — ADALIMUMAB 40 MG/0.8ML ~~LOC~~ AJKT: 40.0000 mg | AUTO-INJECTOR | SUBCUTANEOUS | 6 refills | Status: DC

## 2017-02-12 ENCOUNTER — Other Ambulatory Visit: Payer: Self-pay | Admitting: Internal Medicine

## 2017-03-14 ENCOUNTER — Telehealth: Payer: Self-pay | Admitting: Internal Medicine

## 2017-03-14 NOTE — Telephone Encounter (Signed)
ok 

## 2017-03-19 NOTE — Telephone Encounter (Signed)
Left message for patient that Dr.Pyrtle okayed both procedures and he could call back to schedule.

## 2017-03-30 ENCOUNTER — Other Ambulatory Visit: Payer: Self-pay

## 2017-03-30 DIAGNOSIS — K50918 Crohn's disease, unspecified, with other complication: Secondary | ICD-10-CM

## 2017-04-13 ENCOUNTER — Encounter: Payer: Self-pay | Admitting: Internal Medicine

## 2017-04-13 ENCOUNTER — Other Ambulatory Visit: Payer: Federal, State, Local not specified - PPO

## 2017-04-13 DIAGNOSIS — K50918 Crohn's disease, unspecified, with other complication: Secondary | ICD-10-CM | POA: Diagnosis not present

## 2017-04-17 LAB — QUANTIFERON TB GOLD ASSAY (BLOOD)
Mitogen-Nil: >10 IU/mL
QUANTIFERON TB AG MINUS NIL: 0.01 [IU]/mL
QUANTIFERON(R)-TB GOLD: NEGATIVE
Quantiferon Nil Value: 0.03 IU/mL

## 2017-05-11 ENCOUNTER — Telehealth: Payer: Self-pay | Admitting: Internal Medicine

## 2017-05-11 NOTE — Telephone Encounter (Signed)
Patient states ins needs prior auth for medication humira. Number for [facility] [phone].

## 2017-05-15 DIAGNOSIS — R7301 Impaired fasting glucose: Secondary | ICD-10-CM | POA: Diagnosis not present

## 2017-05-15 DIAGNOSIS — Z125 Encounter for screening for malignant neoplasm of prostate: Secondary | ICD-10-CM | POA: Diagnosis not present

## 2017-05-15 DIAGNOSIS — E7849 Other hyperlipidemia: Secondary | ICD-10-CM | POA: Diagnosis not present

## 2017-05-15 NOTE — Telephone Encounter (Signed)
Prior auth obtained for Humira, good until 11/11/2018. Pt aware.

## 2017-05-30 DIAGNOSIS — Z1389 Encounter for screening for other disorder: Secondary | ICD-10-CM | POA: Diagnosis not present

## 2017-05-30 DIAGNOSIS — K5090 Crohn's disease, unspecified, without complications: Secondary | ICD-10-CM | POA: Diagnosis not present

## 2017-05-30 DIAGNOSIS — R7301 Impaired fasting glucose: Secondary | ICD-10-CM | POA: Diagnosis not present

## 2017-05-30 DIAGNOSIS — Z8719 Personal history of other diseases of the digestive system: Secondary | ICD-10-CM | POA: Diagnosis not present

## 2017-05-30 DIAGNOSIS — Z0000 Encounter for general adult medical examination without abnormal findings: Secondary | ICD-10-CM | POA: Diagnosis not present

## 2017-05-30 DIAGNOSIS — E7849 Other hyperlipidemia: Secondary | ICD-10-CM | POA: Diagnosis not present

## 2017-06-13 ENCOUNTER — Ambulatory Visit (AMBULATORY_SURGERY_CENTER): Payer: Self-pay | Admitting: *Deleted

## 2017-06-13 ENCOUNTER — Other Ambulatory Visit: Payer: Self-pay

## 2017-06-13 VITALS — Ht 68.0 in | Wt 195.0 lb

## 2017-06-13 DIAGNOSIS — K50812 Crohn's disease of both small and large intestine with intestinal obstruction: Secondary | ICD-10-CM

## 2017-06-13 MED ORDER — NA SULFATE-K SULFATE-MG SULF 17.5-3.13-1.6 GM/177ML PO SOLN: 1.0000 | Freq: Once | ORAL | 0 refills | Status: AC

## 2017-06-13 NOTE — Progress Notes (Signed)
No egg or soy allergy known to patient  No issues with past sedation with any surgeries  or procedures, no intubation problems  No diet pills per patient No home 02 use per patient  No blood thinners per patient  Pt denies issues with constipation  No A fib or A flutter  EMMI video sent to pt's e mail - pt declined  suprep coupon $15

## 2017-06-15 ENCOUNTER — Encounter: Payer: Self-pay | Admitting: Internal Medicine

## 2017-06-27 ENCOUNTER — Ambulatory Visit (AMBULATORY_SURGERY_CENTER): Payer: Federal, State, Local not specified - PPO | Admitting: Internal Medicine

## 2017-06-27 ENCOUNTER — Other Ambulatory Visit: Payer: Self-pay

## 2017-06-27 ENCOUNTER — Encounter: Payer: Self-pay | Admitting: Internal Medicine

## 2017-06-27 VITALS — BP 130/70 | HR 56 | Temp 96.2°F | Resp 15 | Ht 68.0 in | Wt 195.0 lb

## 2017-06-27 DIAGNOSIS — K2980 Duodenitis without bleeding: Secondary | ICD-10-CM | POA: Diagnosis not present

## 2017-06-27 DIAGNOSIS — D125 Benign neoplasm of sigmoid colon: Secondary | ICD-10-CM

## 2017-06-27 DIAGNOSIS — K635 Polyp of colon: Secondary | ICD-10-CM | POA: Diagnosis not present

## 2017-06-27 DIAGNOSIS — K259 Gastric ulcer, unspecified as acute or chronic, without hemorrhage or perforation: Secondary | ICD-10-CM

## 2017-06-27 DIAGNOSIS — D123 Benign neoplasm of transverse colon: Secondary | ICD-10-CM

## 2017-06-27 DIAGNOSIS — Z86010 Personal history of colonic polyps: Secondary | ICD-10-CM | POA: Diagnosis not present

## 2017-06-27 DIAGNOSIS — K5080 Crohn's disease of both small and large intestine without complications: Secondary | ICD-10-CM | POA: Diagnosis present

## 2017-06-27 DIAGNOSIS — K2950 Unspecified chronic gastritis without bleeding: Secondary | ICD-10-CM | POA: Diagnosis not present

## 2017-06-27 DIAGNOSIS — D124 Benign neoplasm of descending colon: Secondary | ICD-10-CM

## 2017-06-27 MED ORDER — SODIUM CHLORIDE 0.9 % IV SOLN: 500.0000 mL | Freq: Once | INTRAVENOUS | Status: DC

## 2017-06-27 NOTE — Progress Notes (Signed)
Report given to PACU, vss 

## 2017-06-27 NOTE — Op Note (Signed)
[facility] Patient Name: Terry Kerr Procedure Date: 06/27/2017 8:28 AM MRN: [id number] Endoscopist: Jerene Bears , MD Age: 62 Referring MD:  Date of Birth: [birth year] Gender: Male Account #: [id number] Procedure:                Colonoscopy Indications:              High risk colon cancer surveillance: Crohn's small                            and large intestine, Last colonoscopy: January 2016 Medicines:                Monitored Anesthesia Care Procedure:                Pre-Anesthesia Assessment:                           - Prior to the procedure, a History and Physical                            was performed, and patient medications and                            allergies were reviewed. The patient's tolerance of                            previous anesthesia was also reviewed. The risks                            and benefits of the procedure and the sedation                            options and risks were discussed with the patient.                            All questions were answered, and informed consent                            was obtained. Prior Anticoagulants: The patient has                            taken no previous anticoagulant or antiplatelet                            agents. ASA Grade Assessment: II - A patient with                            mild systemic disease. After reviewing the risks                            and benefits, the patient was deemed in                            satisfactory condition to undergo the procedure.  After obtaining informed consent, the colonoscope                            was passed under direct vision. Throughout the                            procedure, the patient's blood pressure, pulse, and                            oxygen saturations were monitored continuously. The                            Colonoscope was introduced through the anus and                            advanced to  the 10 cm into the ileum. The                            colonoscopy was performed without difficulty. The                            patient tolerated the procedure well. The quality                            of the bowel preparation was adequate to identify                            polyps. The terminal ileum, ileocecal valve,                            appendiceal orifice, and rectum were photographed.                            The quality of the bowel preparation was [Prep                            Quality]. [Anatomical Structures]. Scope In: 8:45:04 AM Scope Out: 9:10:42 AM Scope Withdrawal Time: 0 hours 20 minutes 27 seconds  Total Procedure Duration: 0 hours 25 minutes 38 seconds  Findings:                 The digital rectal exam was normal.                           The terminal ileum appeared normal.                           Grossly mucosa was found in the entire colon,                            though there are area of flat pseudopolyps                            primarily in the left colon. No erosions or  ulcerations were seen to suggest active colitis.                            Four biopsies were taken every 10 cm with a cold                            forceps from the entire colon for Crohn's disease                            surveillance. These biopsy specimens from the right                            colon and left colon were sent to pathology.                           Two sessile polyps were found in the descending                            colon and transverse colon. The polyps were 4 to 6                            mm in size. These polyps were removed with a cold                            snare. Resection and retrieval were complete.                           A 5 mm polyp was found in the sigmoid colon. The                            polyp was sessile. The polyp was removed with a                            cold snare. Resection  and retrieval were complete.                           Scattered small and large-mouthed diverticula were                            found in the sigmoid colon, descending colon and                            ascending colon.                           The retroflexed view of the distal rectum and anal                            verge was normal and showed no anal or rectal                            abnormalities. Complications:  No immediate complications. Estimated Blood Loss:     Estimated blood loss was minimal. Impression:               - The examined portion of the ileum was normal.                           - No evidence of active colitis. Surveillance                            biopsies performed throughout the colon.                           - Two 4 to 6 mm polyps in the descending colon and                            in the transverse colon, removed with a cold snare.                            Resected and retrieved.                           - One 5 mm polyp in the sigmoid colon, removed with                            a cold snare. Resected and retrieved.                           - Mild diverticulosis in the sigmoid colon, in the                            descending colon and in the ascending colon.                           - The distal rectum and anal verge are normal on                            retroflexion view. Recommendation:           - Patient has a contact number available for                            emergencies. The signs and symptoms of potential                            delayed complications were discussed with the                            patient. Return to normal activities tomorrow.                            Written discharge instructions were provided to the                            patient.                           -  Resume previous diet.                           - Continue present medications including Humira 40                             mg every 14 days.                           - Await pathology results.                           - Repeat colonoscopy is recommended for                            surveillance. The colonoscopy date will be                            determined after pathology results from today's                            exam become available for review. Jerene Bears, MD 06/27/2017 9:24:30 AM This report has been signed electronically.

## 2017-06-27 NOTE — Progress Notes (Signed)
Called to room to assist during endoscopic procedure.  Patient ID and intended procedure confirmed with present staff. Received instructions for my participation in the procedure from the performing physician.  

## 2017-06-27 NOTE — Patient Instructions (Signed)
YOU HAD AN ENDOSCOPIC PROCEDURE TODAY AT [facility]:   Refer to the procedure report that was given to you for any specific questions about what was found during the examination.  If the procedure report does not answer your questions, please call your gastroenterologist to clarify.  If you requested that your care partner not be given the details of your procedure findings, then the procedure report has been included in a sealed envelope for you to review at your convenience later.  YOU SHOULD EXPECT: Some feelings of bloating in the abdomen. Passage of more gas than usual.  Walking can help get rid of the air that was put into your GI tract during the procedure and reduce the bloating. If you had a lower endoscopy (such as a colonoscopy or flexible sigmoidoscopy) you may notice spotting of blood in your stool or on the toilet paper. If you underwent a bowel prep for your procedure, you may not have a normal bowel movement for a few days.  Please Note:  You might notice some irritation and congestion in your nose or some drainage.  This is from the oxygen used during your procedure.  There is no need for concern and it should clear up in a day or so.  SYMPTOMS TO REPORT IMMEDIATELY:   Following lower endoscopy (colonoscopy or flexible sigmoidoscopy):  Excessive amounts of blood in the stool  Significant tenderness or worsening of abdominal pains  Swelling of the abdomen that is new, acute  Fever of 100F or higher   Following upper endoscopy (EGD)  Vomiting of blood or coffee ground material  New chest pain or pain under the shoulder blades  Painful or persistently difficult swallowing  New shortness of breath  Fever of 100F or higher  Black, tarry-looking stools  For urgent or emergent issues, a gastroenterologist can be reached at any hour by calling [phone].   DIET:  We do recommend a small meal at first, but then you may proceed to your regular diet.  Drink  plenty of fluids but you should avoid alcoholic beverages for 24 hours.  ACTIVITY:  You should plan to take it easy for the rest of today and you should NOT DRIVE or use heavy machinery until tomorrow (because of the sedation medicines used during the test).    FOLLOW UP: Our staff will call the number listed on your records the next business day following your procedure to check on you and address any questions or concerns that you may have regarding the information given to you following your procedure. If we do not reach you, we will leave a message.  However, if you are feeling well and you are not experiencing any problems, there is no need to return our call.  We will assume that you have returned to your regular daily activities without incident.  If any biopsies were taken you will be contacted by phone or by letter within the next 1-3 weeks.  Please call us at [phone] if you have not heard about the biopsies in 3 weeks.    SIGNATURES/CONFIDENTIALITY: You and/or your care partner have signed paperwork which will be entered into your electronic medical record.  These signatures attest to the fact that that the information above on your After Visit Summary has been reviewed and is understood.  Full responsibility of the confidentiality of this discharge information lies with you and/or your care-partner.   Avoid NSAIDS. Resume medications. Information given on polyps and diverticulosis.

## 2017-06-27 NOTE — Progress Notes (Signed)
Pt's states no medical or surgical changes since previsit or office visit. 

## 2017-06-27 NOTE — Op Note (Signed)
[facility] Patient Name: [place] Procedure Date: 06/27/2017 8:29 AM MRN: [id number] Endoscopist: Jerene Bears , MD Age: 62 Referring MD:  Date of Birth: 02-21-1956 Gender: Male Account #: [id number] Procedure:                Upper GI endoscopy Indications:              Crohn's disease Medicines:                Monitored Anesthesia Care Procedure:                Pre-Anesthesia Assessment:                           - Prior to the procedure, a History and Physical                            was performed, and patient medications and                            allergies were reviewed. The patient's tolerance of                            previous anesthesia was also reviewed. The risks                            and benefits of the procedure and the sedation                            options and risks were discussed with the patient.                            All questions were answered, and informed consent                            was obtained. Prior Anticoagulants: The patient has                            taken no previous anticoagulant or antiplatelet                            agents. ASA Grade Assessment: II - A patient with                            mild systemic disease. After reviewing the risks                            and benefits, the patient was deemed in                            satisfactory condition to undergo the procedure.                           After obtaining informed consent, the endoscope was  passed under direct vision. Throughout the                            procedure, the patient's blood pressure, pulse, and                            oxygen saturations were monitored continuously. The                            Endoscope was introduced through the mouth, and                            advanced to the second part of duodenum. The upper                            GI endoscopy was accomplished without  difficulty.                            The patient tolerated the procedure well. Scope In: Scope Out: Findings:                 The examined esophagus was normal. Z-line regular                            at 40 cm.                           Two non-bleeding localized erosions were found in                            the gastric antrum. Biopsies were taken with a cold                            forceps for histology and Helicobacter pylori                            testing. There was mild erythema at the pylorus                            without stricture.                           The exam of the stomach was otherwise normal                            including retroflexed views.                           Localized mild inflammation characterized by                            erythema and shallow ulcerations was found in the                            duodenal bulb near the duodenal sweep. Biopsies  were taken with a cold forceps for histology.                           The second portion of the duodenum was normal. Complications:            No immediate complications. Estimated Blood Loss:     Estimated blood loss was minimal. Impression:               - Normal esophagus.                           - Gastric erosions without bleeding. Biopsied.                           - Duodenitis with shallow ulcerations. Biopsied.                           - Normal second portion of the duodenum. Recommendation:           - Patient has a contact number available for                            emergencies. The signs and symptoms of potential                            delayed complications were discussed with the                            patient. Return to normal activities tomorrow.                            Written discharge instructions were provided to the                            patient.                           - Resume previous diet.                            - Continue present medications.                           - Await pathology results.                           - Avoid NSAIDs. Jerene Bears, MD 06/27/2017 9:17:24 AM This report has been signed electronically.

## 2017-06-28 ENCOUNTER — Telehealth: Payer: Self-pay

## 2017-06-28 NOTE — Telephone Encounter (Signed)
  Follow up Call-  Call back number 06/27/2017  Post procedure Call Back phone  # [phone]  Permission to leave phone message Yes  Some recent data might be hidden     Patient questions:  Do you have a fever, pain , or abdominal swelling? No. Pain Score  0 *  Have you tolerated food without any problems? Yes.    Have you been able to return to your normal activities? Yes.    Do you have any questions about your discharge instructions: Diet   No. Medications  No. Follow up visit  No.  Do you have questions or concerns about your Care? No.  Actions: * If pain score is 4 or above: No action needed, pain <4.

## 2017-07-03 ENCOUNTER — Encounter: Payer: Self-pay | Admitting: Internal Medicine

## 2017-07-05 ENCOUNTER — Telehealth: Payer: Self-pay | Admitting: Internal Medicine

## 2017-07-05 ENCOUNTER — Other Ambulatory Visit: Payer: Self-pay

## 2017-07-05 MED ORDER — ADALIMUMAB 40 MG/0.4ML ~~LOC~~ PSKT: 40.0000 mg | PREFILLED_SYRINGE | SUBCUTANEOUS | 11 refills | Status: DC

## 2017-07-05 NOTE — Telephone Encounter (Signed)
Citrate free Humira script sent to pharmacy for pt. Pt wants to know if Dr. Hilarie Fredrickson wants him to have a scan or something to check out the small bowel. Please advise.

## 2017-07-06 ENCOUNTER — Other Ambulatory Visit: Payer: Self-pay

## 2017-07-06 DIAGNOSIS — K50919 Crohn's disease, unspecified, with unspecified complications: Secondary | ICD-10-CM

## 2017-07-06 DIAGNOSIS — K5090 Crohn's disease, unspecified, without complications: Secondary | ICD-10-CM

## 2017-07-06 NOTE — Telephone Encounter (Signed)
Spoke with pt and he is aware. Pt scheduled for MRI enterography of abd and pelvis at [facility] 07/13/17@3pm , pt to arrive there at 2pm. Pt to be NPO after 11am. Pt aware.

## 2017-07-06 NOTE — Telephone Encounter (Signed)
MRI enterography to assessment small bowel Crohn's disease can be performed at this time. Hx of small bowel stricture requiring surgery for his Crohn's enterocolitis

## 2017-07-13 ENCOUNTER — Ambulatory Visit (HOSPITAL_COMMUNITY)
Admission: RE | Admit: 2017-07-13 | Discharge: 2017-07-13 | Disposition: A | Discharge status: Home / Self Care | Payer: Federal, State, Local not specified - PPO | Source: Ambulatory Visit | Attending: Internal Medicine | Admitting: Internal Medicine

## 2017-07-13 ENCOUNTER — Other Ambulatory Visit (HOSPITAL_COMMUNITY): Payer: Federal, State, Local not specified - PPO

## 2017-07-13 DIAGNOSIS — K5730 Diverticulosis of large intestine without perforation or abscess without bleeding: Secondary | ICD-10-CM | POA: Insufficient documentation

## 2017-07-13 DIAGNOSIS — K50919 Crohn's disease, unspecified, with unspecified complications: Secondary | ICD-10-CM | POA: Diagnosis not present

## 2017-07-13 DIAGNOSIS — K50918 Crohn's disease, unspecified, with other complication: Secondary | ICD-10-CM | POA: Diagnosis not present

## 2017-07-13 DIAGNOSIS — K5090 Crohn's disease, unspecified, without complications: Secondary | ICD-10-CM

## 2017-07-13 DIAGNOSIS — I700 Atherosclerosis of aorta: Secondary | ICD-10-CM | POA: Insufficient documentation

## 2017-07-13 DIAGNOSIS — K7689 Other specified diseases of liver: Secondary | ICD-10-CM | POA: Insufficient documentation

## 2017-07-13 MED ORDER — GADOBENATE DIMEGLUMINE 529 MG/ML IV SOLN
20.0000 mL | Freq: Once | INTRAVENOUS | Status: AC | PRN
  Administered 2017-07-13: 18 mL via INTRAVENOUS

## 2017-07-16 LAB — POCT I-STAT CREATININE: CREATININE: 0.9 mg/dL (ref 0.61–1.24)

## 2017-07-17 ENCOUNTER — Other Ambulatory Visit: Payer: Self-pay

## 2017-07-17 DIAGNOSIS — K5090 Crohn's disease, unspecified, without complications: Secondary | ICD-10-CM

## 2017-07-27 ENCOUNTER — Other Ambulatory Visit: Payer: Federal, State, Local not specified - PPO

## 2017-07-27 DIAGNOSIS — K5090 Crohn's disease, unspecified, without complications: Secondary | ICD-10-CM

## 2017-07-27 NOTE — Addendum Note (Signed)
Addended by: Kathlyn Sacramento on: 07/27/2017 10:37 AM   Modules accepted: Orders

## 2017-08-04 LAB — ADALIMUMAB LEVEL AND ANTIBODY
Adalimumab Drug Level: 10 ug/mL
Anti-Adalimumab Antibody: <25 ng/mL

## 2017-10-03 DIAGNOSIS — Z85828 Personal history of other malignant neoplasm of skin: Secondary | ICD-10-CM | POA: Diagnosis not present

## 2017-10-03 DIAGNOSIS — L218 Other seborrheic dermatitis: Secondary | ICD-10-CM | POA: Diagnosis not present

## 2017-10-03 DIAGNOSIS — L0889 Other specified local infections of the skin and subcutaneous tissue: Secondary | ICD-10-CM | POA: Diagnosis not present

## 2017-10-03 DIAGNOSIS — C44519 Basal cell carcinoma of skin of other part of trunk: Secondary | ICD-10-CM | POA: Diagnosis not present

## 2017-10-03 DIAGNOSIS — L304 Erythema intertrigo: Secondary | ICD-10-CM | POA: Diagnosis not present

## 2017-10-19 DIAGNOSIS — H2513 Age-related nuclear cataract, bilateral: Secondary | ICD-10-CM | POA: Diagnosis not present

## 2017-10-19 DIAGNOSIS — H5213 Myopia, bilateral: Secondary | ICD-10-CM | POA: Diagnosis not present

## 2017-10-23 ENCOUNTER — Ambulatory Visit: Payer: Federal, State, Local not specified - PPO | Admitting: Internal Medicine

## 2017-10-23 ENCOUNTER — Encounter: Payer: Self-pay | Admitting: Internal Medicine

## 2017-10-23 ENCOUNTER — Telehealth: Payer: Self-pay | Admitting: *Deleted

## 2017-10-23 ENCOUNTER — Other Ambulatory Visit (INDEPENDENT_AMBULATORY_CARE_PROVIDER_SITE_OTHER): Payer: Federal, State, Local not specified - PPO

## 2017-10-23 VITALS — BP 132/74 | HR 76 | Ht 68.0 in | Wt 196.0 lb

## 2017-10-23 DIAGNOSIS — K5080 Crohn's disease of both small and large intestine without complications: Secondary | ICD-10-CM

## 2017-10-23 DIAGNOSIS — Z79899 Other long term (current) drug therapy: Secondary | ICD-10-CM

## 2017-10-23 LAB — COMPREHENSIVE METABOLIC PANEL
ALBUMIN: 4 g/dL (ref 3.5–5.2)
ALK PHOS: 58 U/L (ref 39–117)
ALT: 33 U/L (ref 0–53)
AST: 16 U/L (ref 0–37)
BUN: 20 mg/dL (ref 6–23)
CO2: 30 mEq/L (ref 19–32)
Calcium: 9.6 mg/dL (ref 8.4–10.5)
Chloride: 106 mEq/L (ref 96–112)
Creatinine, Ser: 0.99 mg/dL (ref 0.40–1.50)
GFR: 81.37 mL/min (ref >60.00)
GLUCOSE: 92 mg/dL (ref 70–99)
POTASSIUM: 4.5 meq/L (ref 3.5–5.1)
Sodium: 142 mEq/L (ref 135–145)
TOTAL PROTEIN: 6.9 g/dL (ref 6.0–8.3)
Total Bilirubin: 0.8 mg/dL (ref 0.2–1.2)

## 2017-10-23 LAB — CBC WITH DIFFERENTIAL/PLATELET
BASOS PCT: 0.4 % (ref 0.0–3.0)
Basophils Absolute: 0 10*3/uL (ref 0.0–0.1)
EOS PCT: 1.6 % (ref 0.0–5.0)
Eosinophils Absolute: 0.1 10*3/uL (ref 0.0–0.7)
HCT: 42.6 % (ref 39.0–52.0)
HEMOGLOBIN: 15 g/dL (ref 13.0–17.0)
Lymphocytes Relative: 27.5 % (ref 12.0–46.0)
Lymphs Abs: 1.4 10*3/uL (ref 0.7–4.0)
MCHC: 35.2 g/dL (ref 30.0–36.0)
MCV: 90.7 fl (ref 78.0–100.0)
MONOS PCT: 9.9 % (ref 3.0–12.0)
Monocytes Absolute: 0.5 10*3/uL (ref 0.1–1.0)
Neutro Abs: 3.1 10*3/uL (ref 1.4–7.7)
Neutrophils Relative %: 60.6 % (ref 43.0–77.0)
Platelets: 209 10*3/uL (ref 150.0–400.0)
RBC: 4.7 Mil/uL (ref 4.22–5.81)
RDW: 13.4 % (ref 11.5–15.5)
WBC: 5.1 10*3/uL (ref 4.0–10.5)

## 2017-10-23 LAB — HIGH SENSITIVITY CRP

## 2017-10-23 NOTE — Patient Instructions (Signed)
Your provider has requested that you go to the basement level for lab work before leaving today. Press "B" on the elevator. The lab is located at the first door on the left as you exit the elevator.  Please continue Humira every 14 days.  Follow up with Dr Hilarie Fredrickson in 1 year.  If you are age 62 or older, your body mass index should be between 23-30. Your Body mass index is 29.8 kg/m. If this is out of the aforementioned range listed, please consider follow up with your Primary Care Provider.  If you are age 55 or younger, your body mass index should be between 19-25. Your Body mass index is 29.8 kg/m. If this is out of the aformentioned range listed, please consider follow up with your Primary Care Provider.

## 2017-10-23 NOTE — Progress Notes (Signed)
Subjective:    Patient ID: [place], male    DOB: 25-Aug-1955, 61 y.o.   MRN: [id number]  HPI Tao Satz is a 62 yo male with PMH of ileocolonic Crohn's disease with ileal stricture s/p segmental small bowel resection in May 2016 who is here for follow-up.  He is here alone today.  He was last seen in the office in February 2018 and for upper endoscopy and colonoscopy performed in January 2019.  His upper endoscopy revealed normal esophagus, 2 nonbleeding erosions in the gastric antrum and localized mild inflammation in the duodenal bulb.  Biopsies from the duodenum showed peptic duodenitis.  Stomach biopsy showed mild chronic gastritis without H. pylori, metaplasia or dysplasia.  The colonoscopy revealed a normal examined terminal ileum, grossly normal mucosa in the entire colon with flat pseudopolyps primarily in the left colon.  Four-quadrant biopsies were performed.  3 polyps were removed from the transverse, descending and sigmoid the largest being 6 mm.  There was diverticulosis in the left colon and ascending colon.  Pathology results showed hyperplastic colon polyps and otherwise benign colonic mucosa with no evidence of active Crohn's disease or inflammation.  Was no dysplasia.  He has been maintained on Humira 40 mg every 14 days.  He reports that several months ago he developed a mild flare of his Crohn's disease which for him was overall feeling poorly mild abdominal distention, low energy levels and at times mild abdominal pain.  During this time he back soft with his regular diet and symptoms improved without medication.  He is tolerating Humira.  No recent symptoms at all of active Crohn's disease.  He is eating a completely regular diet without pain, nausea or vomiting.  No change in bowel habits, frequent diarrhea or constipation.  He has had stress in his life but is nearing retirement.  He is also going on a vacation tomorrow to [facility] for 1 week with his wife.  His wife has  had a health scare in the last year being diagnosed with a gastrointestinal stromal tumor and is status post resection but requiring Gleevec and frequent surveillance imaging.  They also have a great friend who is near the end of her life with metastatic thyroid cancer.  He did have an MR enterography which was performed in February which showed a 7 cm segment of mild bowel wall thickening suggestive of ileitis without obstruction  Review of Systems As per HPI, otherwise negative  Current Medications, Allergies, Past Medical History, Past Surgical History, Family History and Social History were reviewed in [employer] record.     Objective:   Physical Exam BP 132/74   Pulse 76   Ht 5' 8"  (1.727 m)   Wt 196 lb (88.9 kg)   BMI 29.80 kg/m  Constitutional: Well-developed and well-nourished. No distress. HEENT: Normocephalic and atraumatic. Conjunctivae are normal.  No scleral icterus. Neck: Neck supple. Trachea midline. Cardiovascular: Normal rate, regular rhythm and intact distal pulses. No M/R/G Pulmonary/chest: Effort normal and breath sounds normal. No wheezing, rales or rhonchi. Abdominal: Soft, mild tenderness right middle abd and left mid abd in mid-axillary line, nondistended. Bowel sounds active throughout. There are no masses palpable. No hepatosplenomegaly. Extremities: no clubbing, cyanosis, or edema Neurological: Alert and oriented to person place and time. Skin: Skin is warm and dry. Psychiatric: Normal mood and affect. Behavior is normal.  MR ABDOMEN AND PELVIS WITHOUT AND WITH CONTRAST (MR ENTEROGRAPHY)   TECHNIQUE: Multiplanar, multisequence MRI of the  abdomen and pelvis was performed both before and during bolus administration of intravenous contrast. Negative oral contrast VoLumen was given.   CONTRAST:  37m MULTIHANCE GADOBENATE DIMEGLUMINE 529 MG/ML IV SOLN   COMPARISON:  09/29/2014 CT scan   FINDINGS: COMBINED FINDINGS FOR BOTH MR  ABDOMEN AND PELVIS   Lower chest: Unremarkable where included   Hepatobiliary: Multiple hepatic cysts are present, some with small septations, as on the prior CT from 09/29/2014. The largest of these measures 5.8 by 5.8 cm on image 9/3, and previously measured 4.0 by 3.9 cm. No appreciable mural nodularity or soft tissue enhancing component.   Gallbladder unremarkable.  No biliary dilatation.   Pancreas:  Unremarkable   Spleen:  Unremarkable   Adrenals/Urinary Tract: The adrenal glands appear normal. 2 Bosniak category 1 cysts of the left mid kidney are present.   There is wall thickening of the left renal collecting system along with a small amount of edema around the collecting system, best shown on images 31 through 35 of series 4 and also on images 85-88 of series 704. The cause is uncertain, but is but the patient did have left-sided renal calculi back on 09/29/2014, and a local calculus might potentially cause inflammation and a similar appearance. There is no current hydronephrosis or hydroureter to suggest obstruction.   Stomach/Bowel: The stomach and duodenum appear normal. Jejunal fold pattern unremarkable. The small bowel caliber is normal.   Sigmoid diverticulosis is present without active diverticulitis.   In the distal ileum although not including the most terminal ileum, there is an approximately 7 cm segment of bowel which is collapsed on multiple sequences, but on sequences where this bowel does not appear collapsed there appears to be some mild differentially accentuated enhancement in the bowel wall, for example on images 38 through 51 of series 10704, which could indicate low-level active inflammation.   Vascular/Lymphatic: There is evidence of mild aortic atherosclerotic vascular disease. No pathologic adenopathy.   Reproductive: Unremarkable   Other:  No supplemental non-categorized findings.   Musculoskeletal: Anterior abdominal wall laxity along  the umbilicus with a small amount of bowel in this region, but no overt hernia.   IMPRESSION: 1. There is potentially low-grade inflammation in a 7 cm segment of distal small bowel, although the finding is subtle. Otherwise unremarkable MR enterography appearance of the small bowel. 2. Sigmoid colon diverticulosis. Around the left collecting system there is abnormal edema and some mild wall thickening. The patient did have left-sided renal calculi on the prior 29518CT exam, and certainly inflammation related to an otherwise occult collecting system stone might occasionally cause this type of appearance. There is no current hydronephrosis or hydroureter. 3.  Aortic Atherosclerosis (ICD10-I70.0). 4. Multiple hepatic cysts are again noted, some with several internal septations. No mural thrombus or specific worrisome characteristics.     Electronically Signed   By: WVan ClinesM.D.   On: 07/13/2017 17:50   Quant gold - neg Nov 2018       Assessment & Plan:  62yo male with PMH of ileocolonic Crohn's disease with ileal stricture s/p segmental small bowel resection in May 2016 who is here for follow-up.   1.  Crohn's ileocolitis status post segmental bowel resection --MR enterography reviewed which showed short segment of probable active Crohn's which did fit with symptoms from several months ago.  We checked his Humira level which was adequate at 10 and antibodies were negative.  He is currently symptom-free.  He may require budesonide therapy  in the event of recurrent symptoms.  He is asked to notify me if he develops abdominal pain, bloating or distention, nausea.  He voices understanding.  I am hesitant to escalate therapy given how well he is doing now. --Continue Humira 40 mg every 14 days (interferon gold negative in November.  This test was not covered by his insurance but certainly should be with a diagnosis of Crohn's disease and high risk medication/chronic  immunosuppressive medication.  We will refile this) --Regular diet but low residue diet in the event of any active small bowel Crohn's symptoms --Check CBC, CMP and CRP today --Annual follow-up --He is previously had zoster vaccine, Pneumovax in 2016.  Annual flu vaccine recommended  Follow-up in 1 year, sooner if needed 25 minutes spent with the patient today. Greater than 50% was spent in counseling and coordination of care with the patient

## 2017-10-23 NOTE — Telephone Encounter (Signed)
Patient came for office visit today and brought along with him an invoice from [facility] for $336.34 for service date 04/13/17 for "TB Cell Med Gamma Inter". Invoice is dated 09/19/17, invoice #[id number]. ICD Codes given for test were K50.918.   I contacted [place] today at phone [phone] and spoke to April to inquire about this fee and to give additional codes to be submitted to insurance (codes Z79.899, K50.80). She indicates that patient's insurance actually paid the entire $336.34 in full on 09/19/17, the date of the invoice. She also indicates that they will send patient a letter with the $0.00 balance.   I have spoken to patient to advise him of the information above. He verbalizes understanding and expresses his thanks for our follow up.

## 2017-11-22 DIAGNOSIS — L0109 Other impetigo: Secondary | ICD-10-CM | POA: Diagnosis not present

## 2017-11-22 DIAGNOSIS — C44519 Basal cell carcinoma of skin of other part of trunk: Secondary | ICD-10-CM | POA: Diagnosis not present

## 2017-11-22 DIAGNOSIS — L0101 Non-bullous impetigo: Secondary | ICD-10-CM | POA: Diagnosis not present

## 2017-12-28 ENCOUNTER — Encounter: Payer: Self-pay | Admitting: Internal Medicine

## 2018-04-05 DIAGNOSIS — Z23 Encounter for immunization: Secondary | ICD-10-CM | POA: Diagnosis not present

## 2018-05-07 DIAGNOSIS — D485 Neoplasm of uncertain behavior of skin: Secondary | ICD-10-CM | POA: Diagnosis not present

## 2018-05-07 DIAGNOSIS — C44519 Basal cell carcinoma of skin of other part of trunk: Secondary | ICD-10-CM | POA: Diagnosis not present

## 2018-05-07 DIAGNOSIS — L2089 Other atopic dermatitis: Secondary | ICD-10-CM | POA: Diagnosis not present

## 2018-05-07 DIAGNOSIS — Z85828 Personal history of other malignant neoplasm of skin: Secondary | ICD-10-CM | POA: Diagnosis not present

## 2018-05-07 DIAGNOSIS — L0102 Bockhart's impetigo: Secondary | ICD-10-CM | POA: Diagnosis not present

## 2018-05-07 DIAGNOSIS — L570 Actinic keratosis: Secondary | ICD-10-CM | POA: Diagnosis not present

## 2018-05-07 DIAGNOSIS — D225 Melanocytic nevi of trunk: Secondary | ICD-10-CM | POA: Diagnosis not present

## 2018-05-07 DIAGNOSIS — L218 Other seborrheic dermatitis: Secondary | ICD-10-CM | POA: Diagnosis not present

## 2018-05-27 ENCOUNTER — Other Ambulatory Visit: Payer: Self-pay | Admitting: Internal Medicine

## 2018-06-11 ENCOUNTER — Ambulatory Visit: Payer: Federal, State, Local not specified - PPO | Admitting: Internal Medicine

## 2018-06-11 ENCOUNTER — Encounter: Payer: Self-pay | Admitting: Internal Medicine

## 2018-06-11 ENCOUNTER — Encounter (INDEPENDENT_AMBULATORY_CARE_PROVIDER_SITE_OTHER): Payer: Self-pay

## 2018-06-11 VITALS — BP 140/68 | HR 76 | Ht 68.0 in | Wt 198.2 lb

## 2018-06-11 DIAGNOSIS — K5080 Crohn's disease of both small and large intestine without complications: Secondary | ICD-10-CM | POA: Diagnosis not present

## 2018-06-11 DIAGNOSIS — R82998 Other abnormal findings in urine: Secondary | ICD-10-CM | POA: Diagnosis not present

## 2018-06-11 DIAGNOSIS — Z79899 Other long term (current) drug therapy: Secondary | ICD-10-CM | POA: Diagnosis not present

## 2018-06-11 DIAGNOSIS — Z0000 Encounter for general adult medical examination without abnormal findings: Secondary | ICD-10-CM | POA: Diagnosis not present

## 2018-06-11 DIAGNOSIS — R7301 Impaired fasting glucose: Secondary | ICD-10-CM | POA: Diagnosis not present

## 2018-06-11 DIAGNOSIS — Z125 Encounter for screening for malignant neoplasm of prostate: Secondary | ICD-10-CM | POA: Diagnosis not present

## 2018-06-11 DIAGNOSIS — E7849 Other hyperlipidemia: Secondary | ICD-10-CM | POA: Diagnosis not present

## 2018-06-11 MED ORDER — TUBERCULIN PPD 5 UNIT/0.1ML ID SOLN
5.0000 [IU] | Freq: Once | INTRADERMAL | Status: AC
  Administered 2018-06-11: 5 [IU] via INTRADERMAL

## 2018-06-11 NOTE — Progress Notes (Signed)
Subjective:    Patient ID: UNIQUE SEARFOSS, male    DOB: [birth year], 63 y.o.   MRN: [id number]  HPI Terry Kerr is a 63 yo male with PMH of ileocolonic Crohn's disease with ileal stricture status post segmental small bowel resection in May 2016 who is here for follow-up.  He is here alone today and was last seen on 10/23/2017.  He has been maintained on Humira 40 mg every 14 days.  Physically he reports he has been doing quite well.  He has had no episodes of abdominal pain, nausea or vomiting.  No change in bowel habit.  No blood in his stool or melena.  He does live with a low-level fear of future attacks.  He reports that he has had a very stressful year.  His wife was diagnosed and treated for a gastrointestinal stromal cell tumor and took [place] for a short time with resultant liver enzyme elevation.  She then required a steroid taper.  His wife lost a very close and dear friend to breast cancer.  His mother died a week ago of pancreatic cancer.  Despite the stressors his disease/Crohn's has not flared.  His daughter is a bright spot and is doing very well with her job and is living in [place].  He continues to work with thoughts of retirement but no definite plans.  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  Review of Systems As per HPI, otherwise negative  Current Medications, Allergies, Past Medical History, Past Surgical History, Family History and Social History were reviewed in [employer] record.     Objective:   Physical Exam BP 140/68   Pulse 76   Ht 5' 8"  (1.727 m)   Wt 198 lb 4 oz (89.9 kg)   BMI 30.14 kg/m  Gen: awake, alert, NAD HEENT: anicteric, op clear CV: RRR, no mrg Pulm: CTA b/l Abd: soft, NT/ND, +BS throughout Ext: no c/c/e Neuro: nonfocal  CBC    Component Value Date/Time   WBC 5.1 10/23/2017 0917   RBC  4.70 10/23/2017 0917   HGB 15.0 10/23/2017 0917   HCT 42.6 10/23/2017 0917   PLT 209.0 10/23/2017 0917   MCV 90.7 10/23/2017 0917   MCH 30.4 10/05/2014 0428   MCHC 35.2 10/23/2017 0917   RDW 13.4 10/23/2017 0917   LYMPHSABS 1.4 10/23/2017 0917   MONOABS 0.5 10/23/2017 0917   EOSABS 0.1 10/23/2017 0917   BASOSABS 0.0 10/23/2017 0917   CMP     Component Value Date/Time   NA 142 10/23/2017 0917   K 4.5 10/23/2017 0917   CL 106 10/23/2017 0917   CO2 30 10/23/2017 0917   GLUCOSE 92 10/23/2017 0917   BUN 20 10/23/2017 0917   CREATININE 0.99 10/23/2017 0917   CALCIUM 9.6 10/23/2017 0917   PROT 6.9 10/23/2017 0917   ALBUMIN 4.0 10/23/2017 0917   AST 16 10/23/2017 0917   ALT 33 10/23/2017 0917   ALKPHOS 58 10/23/2017 0917   BILITOT 0.8 10/23/2017 0917   GFRNONAA >60 10/05/2014 0428   GFRAA >60 10/05/2014 0428       Assessment & Plan:  63 yo male with PMH of ileocolonic Crohn's disease with ileal stricture status post segmental small bowel resection in May 2016 who is here for follow-up.    1.  Colitis status post segmental bowel resection --he has been  doing clinically well on Humira.  No symptoms of active disease and no partial obstructive symptoms.  We will continue him on Humira 40 mg every 14 days. --Humira 40 mg every 14 days --PPD today, QuantiFERON gold was not covered by his insurance last year --He recently had blood counts and metabolic panel by Dr. Brigitte Pulse --He has had Pneumovax and zoster vaccine.  He had flu vaccine this year  2.  CRC screening --repeat colonoscopy is recommended 3 years from previous which would be February 2022  Annual follow-up, sooner if needed  25 minutes spent with the patient today. Greater than 50% was spent in counseling and coordination of care with the patient

## 2018-06-11 NOTE — Patient Instructions (Addendum)
Continue Humira 40 mg every 14 days.  We have given you a TB skin test today. Please make certain to come back to the office for a reading between 48-72 hours from now to avoid requiring repeat testing.  If you are age 63 or older, your body mass index should be between 23-30. Your Body mass index is 30.14 kg/m. If this is out of the aforementioned range listed, please consider follow up with your Primary Care Provider.  If you are age 22 or younger, your body mass index should be between 19-25. Your Body mass index is 30.14 kg/m. If this is out of the aformentioned range listed, please consider follow up with your Primary Care Provider.

## 2018-06-13 ENCOUNTER — Other Ambulatory Visit: Payer: Self-pay | Admitting: *Deleted

## 2018-06-13 DIAGNOSIS — K5080 Crohn's disease of both small and large intestine without complications: Secondary | ICD-10-CM

## 2018-06-13 LAB — READ PPD: TB SKIN TEST: NEGATIVE

## 2018-06-13 NOTE — Addendum Note (Signed)
Addended by: Larina Bras on: 06/13/2018 04:35 PM   Modules accepted: Orders

## 2018-06-18 DIAGNOSIS — E7849 Other hyperlipidemia: Secondary | ICD-10-CM | POA: Diagnosis not present

## 2018-06-18 DIAGNOSIS — Z0000 Encounter for general adult medical examination without abnormal findings: Secondary | ICD-10-CM | POA: Diagnosis not present

## 2018-06-18 DIAGNOSIS — K5090 Crohn's disease, unspecified, without complications: Secondary | ICD-10-CM | POA: Diagnosis not present

## 2018-06-18 DIAGNOSIS — Z1331 Encounter for screening for depression: Secondary | ICD-10-CM | POA: Diagnosis not present

## 2018-06-18 DIAGNOSIS — Z1389 Encounter for screening for other disorder: Secondary | ICD-10-CM | POA: Diagnosis not present

## 2018-06-18 DIAGNOSIS — Z1339 Encounter for screening examination for other mental health and behavioral disorders: Secondary | ICD-10-CM | POA: Diagnosis not present

## 2018-06-18 DIAGNOSIS — R7301 Impaired fasting glucose: Secondary | ICD-10-CM | POA: Diagnosis not present

## 2018-06-18 DIAGNOSIS — R030 Elevated blood-pressure reading, without diagnosis of hypertension: Secondary | ICD-10-CM | POA: Diagnosis not present

## 2018-06-24 ENCOUNTER — Other Ambulatory Visit: Payer: Self-pay | Admitting: Internal Medicine

## 2018-06-28 ENCOUNTER — Other Ambulatory Visit (INDEPENDENT_AMBULATORY_CARE_PROVIDER_SITE_OTHER): Payer: Federal, State, Local not specified - PPO

## 2018-06-28 ENCOUNTER — Telehealth: Payer: Self-pay | Admitting: Internal Medicine

## 2018-06-28 DIAGNOSIS — R1032 Left lower quadrant pain: Secondary | ICD-10-CM | POA: Diagnosis not present

## 2018-06-28 LAB — CBC WITH DIFFERENTIAL/PLATELET
BASOS ABS: 0.1 10*3/uL (ref 0.0–0.1)
Basophils Relative: 1.2 % (ref 0.0–3.0)
EOS ABS: 0.1 10*3/uL (ref 0.0–0.7)
Eosinophils Relative: 2 % (ref 0.0–5.0)
HCT: 46.5 % (ref 39.0–52.0)
Hemoglobin: 15.8 g/dL (ref 13.0–17.0)
LYMPHS ABS: 1.9 10*3/uL (ref 0.7–4.0)
LYMPHS PCT: 25.6 % (ref 12.0–46.0)
MCHC: 33.9 g/dL (ref 30.0–36.0)
MCV: 91.7 fl (ref 78.0–100.0)
Monocytes Absolute: 0.7 10*3/uL (ref 0.1–1.0)
Monocytes Relative: 9.8 % (ref 3.0–12.0)
NEUTROS ABS: 4.6 10*3/uL (ref 1.4–7.7)
NEUTROS PCT: 61.4 % (ref 43.0–77.0)
PLATELETS: 198 10*3/uL (ref 150.0–400.0)
RBC: 5.07 Mil/uL (ref 4.22–5.81)
RDW: 13.1 % (ref 11.5–15.5)
WBC: 7.5 10*3/uL (ref 4.0–10.5)

## 2018-06-28 LAB — COMPREHENSIVE METABOLIC PANEL
ALK PHOS: 58 U/L (ref 39–117)
ALT: 34 U/L (ref 0–53)
AST: 16 U/L (ref 0–37)
Albumin: 4 g/dL (ref 3.5–5.2)
BILIRUBIN TOTAL: 0.9 mg/dL (ref 0.2–1.2)
BUN: 18 mg/dL (ref 6–23)
CALCIUM: 9.3 mg/dL (ref 8.4–10.5)
CO2: 28 meq/L (ref 19–32)
CREATININE: 0.95 mg/dL (ref 0.40–1.50)
Chloride: 107 mEq/L (ref 96–112)
GFR: 80.11 mL/min (ref >60.00)
Glucose, Bld: 92 mg/dL (ref 70–99)
Potassium: 4.4 mEq/L (ref 3.5–5.1)
Sodium: 141 mEq/L (ref 135–145)
TOTAL PROTEIN: 6.2 g/dL (ref 6.0–8.3)

## 2018-06-28 MED ORDER — METRONIDAZOLE 250 MG PO TABS: 250.0000 mg | ORAL_TABLET | Freq: Three times a day (TID) | ORAL | 0 refills | Status: DC

## 2018-06-28 MED ORDER — CIPROFLOXACIN HCL 500 MG PO TABS: 500.0000 mg | ORAL_TABLET | Freq: Two times a day (BID) | ORAL | 0 refills | Status: DC

## 2018-06-28 NOTE — Telephone Encounter (Signed)
While Crohn's disease is possible, my suspicion is that this is diverticulitis given his history of recently controlled Crohn's disease, acute symptoms Would have him come in for CBC and CMP Would start ciprofloxacin 500 mg twice daily and metronidazole 250 mg 3 times daily x7 days Would hold Humira for 48 hours to ensure improvement and then take as normal if improving If worsening over the weekend he will need the ER [place], low residue diet while in pain

## 2018-06-28 NOTE — Telephone Encounter (Signed)
Pt called in and stated that he was advised to call in when he is having a crohns episode and let the nurse know just incase Prednisone Is needed to be ordered.

## 2018-06-28 NOTE — Telephone Encounter (Signed)
Spoke with pt and he is aware.

## 2018-06-28 NOTE — Telephone Encounter (Signed)
Pt calling stating he has been having pain on the left side of his abdomen starting at his belly button all over the the left side, reports the area is tender to touch. Pt states is almost feels like colitis. Pain was bad last night and states he almost went to the hospital but is eased up a little. He is due for Humira today. Pt isn't sure if he is having a flare or not. Please advise.

## 2018-06-28 NOTE — Telephone Encounter (Signed)
Left message for pt to call back, scripts sent to pharmacy, orders in epic.

## 2018-07-02 DIAGNOSIS — L988 Other specified disorders of the skin and subcutaneous tissue: Secondary | ICD-10-CM | POA: Diagnosis not present

## 2018-07-02 DIAGNOSIS — D485 Neoplasm of uncertain behavior of skin: Secondary | ICD-10-CM | POA: Diagnosis not present

## 2018-09-11 DIAGNOSIS — L821 Other seborrheic keratosis: Secondary | ICD-10-CM | POA: Diagnosis not present

## 2018-09-11 DIAGNOSIS — Z85828 Personal history of other malignant neoplasm of skin: Secondary | ICD-10-CM | POA: Diagnosis not present

## 2018-09-11 DIAGNOSIS — L814 Other melanin hyperpigmentation: Secondary | ICD-10-CM | POA: Diagnosis not present

## 2018-09-11 DIAGNOSIS — L570 Actinic keratosis: Secondary | ICD-10-CM | POA: Diagnosis not present

## 2018-09-11 DIAGNOSIS — C44719 Basal cell carcinoma of skin of left lower limb, including hip: Secondary | ICD-10-CM | POA: Diagnosis not present

## 2018-09-11 DIAGNOSIS — D485 Neoplasm of uncertain behavior of skin: Secondary | ICD-10-CM | POA: Diagnosis not present

## 2018-09-11 DIAGNOSIS — L309 Dermatitis, unspecified: Secondary | ICD-10-CM | POA: Diagnosis not present

## 2018-10-15 ENCOUNTER — Telehealth: Payer: Self-pay | Admitting: Internal Medicine

## 2018-10-15 DIAGNOSIS — K5080 Crohn's disease of both small and large intestine without complications: Secondary | ICD-10-CM

## 2018-10-15 MED ORDER — BUDESONIDE 3 MG PO CPEP: 9.0000 mg | ORAL_CAPSULE | Freq: Every day | ORAL | 0 refills | Status: DC

## 2018-10-15 NOTE — Telephone Encounter (Signed)
I recommend the following  CBC, CMP, CRP Budesonide, ileal release in the form of Entocort, 9 mg daily x8 weeks --please ask him if he has taken budesonide in the past.  This is a steroid which works at the level of the ileum where his Crohn's disease is without a significant steroid side effect.  If previously taken and nonresponsive then I may use prednisone Please schedule him a virtual office visit, next available

## 2018-10-15 NOTE — Telephone Encounter (Signed)
Left message on machine to call back  

## 2018-10-15 NOTE — Telephone Encounter (Signed)
Pt reports sharp pain on the lower right side started yesterday. Has not worsened but is consistent.  Has a history of crohn's.  Feels fatigued.  Feels like previous crohn's flare.  Please advise

## 2018-10-15 NOTE — Telephone Encounter (Signed)
The pt has been advised and prescription sent to the pharmacy.  He states he has not tried entocort in the past.  Telehealth appt scheduled. Prescription sent. The pt will have labs tomorrow.

## 2018-10-15 NOTE — Telephone Encounter (Signed)
Pt reported that he is having another Crohn's flare up.  He stated that he is having pain in his lower right side. Please advise.

## 2018-10-16 ENCOUNTER — Other Ambulatory Visit (INDEPENDENT_AMBULATORY_CARE_PROVIDER_SITE_OTHER): Payer: Federal, State, Local not specified - PPO

## 2018-10-16 DIAGNOSIS — K5080 Crohn's disease of both small and large intestine without complications: Secondary | ICD-10-CM | POA: Diagnosis not present

## 2018-10-16 LAB — CBC WITH DIFFERENTIAL/PLATELET
Basophils Absolute: 0 10*3/uL (ref 0.0–0.1)
Basophils Relative: 0.2 % (ref 0.0–3.0)
Eosinophils Absolute: 0.1 10*3/uL (ref 0.0–0.7)
Eosinophils Relative: 0.7 % (ref 0.0–5.0)
HCT: 44.9 % (ref 39.0–52.0)
Hemoglobin: 15.6 g/dL (ref 13.0–17.0)
Lymphocytes Relative: 23.8 % (ref 12.0–46.0)
Lymphs Abs: 1.9 10*3/uL (ref 0.7–4.0)
MCHC: 34.8 g/dL (ref 30.0–36.0)
MCV: 91.6 fl (ref 78.0–100.0)
Monocytes Absolute: 0.6 10*3/uL (ref 0.1–1.0)
Monocytes Relative: 7.9 % (ref 3.0–12.0)
Neutro Abs: 5.4 10*3/uL (ref 1.4–7.7)
Neutrophils Relative %: 67.4 % (ref 43.0–77.0)
Platelets: 218 10*3/uL (ref 150.0–400.0)
RBC: 4.9 Mil/uL (ref 4.22–5.81)
RDW: 13.3 % (ref 11.5–15.5)
WBC: 8 10*3/uL (ref 4.0–10.5)

## 2018-10-16 LAB — COMPREHENSIVE METABOLIC PANEL
ALT: 23 U/L (ref 0–53)
AST: 15 U/L (ref 0–37)
Albumin: 4 g/dL (ref 3.5–5.2)
Alkaline Phosphatase: 55 U/L (ref 39–117)
BUN: 15 mg/dL (ref 6–23)
CO2: 27 mEq/L (ref 19–32)
Calcium: 9 mg/dL (ref 8.4–10.5)
Chloride: 105 mEq/L (ref 96–112)
Creatinine, Ser: 0.91 mg/dL (ref 0.40–1.50)
GFR: 84.1 mL/min (ref >60.00)
Glucose, Bld: 97 mg/dL (ref 70–99)
Potassium: 4.3 mEq/L (ref 3.5–5.1)
Sodium: 140 mEq/L (ref 135–145)
Total Bilirubin: 0.7 mg/dL (ref 0.2–1.2)
Total Protein: 6.7 g/dL (ref 6.0–8.3)

## 2018-10-16 LAB — HIGH SENSITIVITY CRP: CRP, High Sensitivity: 0.73 mg/L (ref 0.000–5.000)

## 2018-10-29 ENCOUNTER — Encounter: Payer: Self-pay | Admitting: Internal Medicine

## 2018-10-29 ENCOUNTER — Other Ambulatory Visit: Payer: Self-pay

## 2018-10-29 ENCOUNTER — Ambulatory Visit (INDEPENDENT_AMBULATORY_CARE_PROVIDER_SITE_OTHER): Payer: Federal, State, Local not specified - PPO | Admitting: Internal Medicine

## 2018-10-29 VITALS — Ht 68.0 in | Wt 190.0 lb

## 2018-10-29 DIAGNOSIS — R1031 Right lower quadrant pain: Secondary | ICD-10-CM

## 2018-10-29 DIAGNOSIS — K5080 Crohn's disease of both small and large intestine without complications: Secondary | ICD-10-CM | POA: Diagnosis not present

## 2018-10-29 MED ORDER — HYOSCYAMINE SULFATE 0.125 MG SL SUBL: SUBLINGUAL_TABLET | SUBLINGUAL | 3 refills | Status: DC

## 2018-10-29 NOTE — Patient Instructions (Addendum)
Complete Entocort 9 mg daily x 8 weeks and then stop.  Continue Humira 40 mg every 14 days.  Please follow up with Dr Hilarie Fredrickson in 6 months.  We have sent the following medications to your pharmacy for you to pick up at your convenience: Levsin 0.125 mg 1 to 2 tablets every 4-6 hours as needed abdominal pain/spasm  You will be due for a recall colonoscopy in 06/2020. We will send you a reminder in the mail when it gets closer to that time.

## 2018-10-29 NOTE — Progress Notes (Signed)
Subjective:    Patient ID: Terry Kerr, male    DOB: 1956-04-07, 63 y.o.   MRN: [id number]  This service was provided via telemedicine.  Doximity app with A/V communication    The patient was located at home The provider was located in provider's GI office. The patient did consent to this telephone visit and is aware of possible charges through their insurance for this visit.   The persons participating in this telemedicine service were the patient and I. Time spent on call: 14 minutes   HPI Terry Kerr is a 63 year old male with a history of ileocolonic Crohn's disease with ileal stricture status post segmental small bowel resection in May 2016 who is here for follow-up.  He was last seen in the office on 06/11/2018 but called with Crohn's flare symptoms on 10/15/2018.  He reports that he developed right lower quadrant abdominal pain initially as a soreness.  This felt like a milder version of his more remote Crohn's flares.  This was associated with fatigue and just general malaise.  He did not have diarrhea or blood in his stool.  No nausea or vomiting.  We started him on ileal release budesonide 9 mg a day he reduced his diet and symptoms have improved significantly.  He has continued Humira 40 mg every 14 days.  He requests a refill on Levsin as he has not had this in many years but he does find it helpful when needed.  He had been having some sciatica pain and also plantar fasciitis which has completely relieved itself on the budesonide, he is unsure if this is related.  He is also been doing yoga with his wife.  He is unsure why had a flare but reports again stress in his life.  His mother died in 26-Jun-2022.  He also retired on 08/27/2018.  He has enjoyed retirement but having to get used to the new lifestyle and schedule.  His 25-year anniversary trip to [place] was canceled due to COVID-19.  Review of Systems As per HPI, otherwise negative  Current Medications, Allergies, Past Medical  History, Past Surgical History, Family History and Social History were reviewed in [employer] record.      Objective:   Physical Exam No PE, virtual visit  CBC    Component Value Date/Time   WBC 8.0 10/16/2018 0927   RBC 4.90 10/16/2018 0927   HGB 15.6 10/16/2018 0927   HCT 44.9 10/16/2018 0927   PLT 218.0 10/16/2018 0927   MCV 91.6 10/16/2018 0927   MCH 30.4 10/05/2014 0428   MCHC 34.8 10/16/2018 0927   RDW 13.3 10/16/2018 0927   LYMPHSABS 1.9 10/16/2018 0927   MONOABS 0.6 10/16/2018 0927   EOSABS 0.1 10/16/2018 0927   BASOSABS 0.0 10/16/2018 0927   CMP     Component Value Date/Time   NA 140 10/16/2018 0927   K 4.3 10/16/2018 0927   CL 105 10/16/2018 0927   CO2 27 10/16/2018 0927   GLUCOSE 97 10/16/2018 0927   BUN 15 10/16/2018 0927   CREATININE 0.91 10/16/2018 0927   CALCIUM 9.0 10/16/2018 0927   PROT 6.7 10/16/2018 0927   ALBUMIN 4.0 10/16/2018 0927   AST 15 10/16/2018 0927   ALT 23 10/16/2018 0927   ALKPHOS 55 10/16/2018 0927   BILITOT 0.7 10/16/2018 0927   GFRNONAA >60 10/05/2014 0428   GFRAA >60 10/05/2014 0428       Assessment & Plan:  63 year old male with a history of ileocolonic  Crohn's disease with ileal stricture status post segmental small bowel resection in May 2016 who is here for follow-up.   1.  Ileocolonic Crohn's disease status post small bowel resection --he had been doing very well on Humira with no symptoms of active disease though now with recent flare responding to budesonide.  We will continue Humira 40 mg every 14 days, complete Entocort 9 mg a day x8 weeks. --Complete Entocort 9 mg daily x8 weeks and then stop --Continue Humira 40 mg every 14 days --22-monthfollow-up --If flare returns before follow-up we need to check a Humira level and antibody --Recent lab work reviewed which was reassuring --Refill Levsin 0.125 mg 1 to 2 tablets every 4-6 hours as needed abdominal pain/spasm  2.  CRC  screening/surveillance --repeat colonoscopy February 2022

## 2018-10-29 NOTE — Addendum Note (Signed)
Addended by: Larina Bras on: 10/29/2018 05:19 PM   Modules accepted: Orders

## 2018-11-28 ENCOUNTER — Telehealth: Payer: Self-pay | Admitting: Internal Medicine

## 2018-11-28 NOTE — Telephone Encounter (Signed)
Recall is in epic for colon in 2022. Please see note below regarding his abd discomfort, budesonide and advise.

## 2018-11-28 NOTE — Telephone Encounter (Signed)
Any change from budesonide or just continue that until lab and scan are completed?

## 2018-11-28 NOTE — Telephone Encounter (Signed)
Rather than colonoscopy I would recommend that we check Humira level plus antibody at trough and repeat MR enterography to assess for active Crohn's disease

## 2018-11-28 NOTE — Telephone Encounter (Signed)
patient states that he is still having pain underneath his navel. He doesn't think that budesonide is working for him. He is also wanting to know if he needs to have a colonoscopy? pt states that he will be away from a phone from 11:45am on. He says that if you don't reach him today that Monday morning is fine.

## 2018-12-02 ENCOUNTER — Other Ambulatory Visit: Payer: Self-pay

## 2018-12-02 DIAGNOSIS — K5080 Crohn's disease of both small and large intestine without complications: Secondary | ICD-10-CM

## 2018-12-02 NOTE — Telephone Encounter (Signed)
Spoke with pt and he is aware of recommendations. States he is feeling much better and wants to hold off on the CT entero for now but will come in for labs. Orders in epic.

## 2018-12-02 NOTE — Telephone Encounter (Signed)
Would continue with budesonide and proceed with labs and CT scan/enterography If pain is worsening prior to these results then he should let me know and I would likely change to prednisone

## 2018-12-03 ENCOUNTER — Other Ambulatory Visit: Payer: Federal, State, Local not specified - PPO

## 2018-12-03 DIAGNOSIS — K5080 Crohn's disease of both small and large intestine without complications: Secondary | ICD-10-CM

## 2018-12-10 LAB — ADALIMUMAB LEVEL AND ANTIBODY
Adalimumab Drug Level: 8.4 ug/mL
Anti-Adalimumab Antibody: <25 ng/mL

## 2019-02-07 ENCOUNTER — Telehealth: Payer: Self-pay | Admitting: Cardiology

## 2019-02-07 NOTE — Telephone Encounter (Signed)
New message   Patient is a new patient, he is a neighbor of Dr. Percival Spanish and wants to be worked into his schedule. He states that he has discussed with Dr. Percival Spanish. Please call.

## 2019-02-07 NOTE — Telephone Encounter (Signed)
Spoke to pt and scheduled appt on Oct 6th @ 2:40pm.

## 2019-03-03 DIAGNOSIS — E785 Hyperlipidemia, unspecified: Secondary | ICD-10-CM | POA: Insufficient documentation

## 2019-03-03 DIAGNOSIS — Z8249 Family history of ischemic heart disease and other diseases of the circulatory system: Secondary | ICD-10-CM | POA: Insufficient documentation

## 2019-03-03 NOTE — Progress Notes (Signed)
Cardiology Office Note   Date:  03/04/2019   ID:  Dacotah, Cabello 10-09-1955, MRN [id number]  PCP:  Marton Redwood, MD  Cardiologist:   Minus Breeding, MD Referring:  Marton Redwood, MD  Chief Complaint  Patient presents with  . Shoulder Pain      History of Present Illness: Terry Kerr is a 63 y.o. male who is referred by Marton Redwood, MD for evaluation of shoulder pain and dyspnea.  The patient has no past cardiac history.  He does have a family history with his father having early heart disease.  He denies any previous cardiac work-up.  He does get some shortness of breath with physical activity like riding his bike.  He has some occasional right arm discomfort in his right shoulder.  This is sporadic.  He cannot reproduce this.  It is mild.  He does not describe associated substernal discomfort, neck or left arm discomfort.  He does not have any resting shortness of breath, PND or orthopnea.  Is not any palpitations, presyncope or syncope.  He has no weight gain or edema.  Past Medical History:  Diagnosis Date  . Allergic rhinitis    pollen, animal hair, dust mites  . Allergy   . Crohn disease ([place])   . Diverticulosis   . Hemorrhoids, internal, thrombosed s/p resection/pexy 12/06/2012 12/04/2012  . HLD (hyperlipidemia)   . Obesity   . Regional enteritis (Crohn's disease) ([place]) 08/01/2007   Qualifier: Diagnosis of  By: Council Mechanic MD, Hilaria Ota   . Small bowel obstruction [facility])     Past Surgical History:  Procedure Laterality Date  . APPENDECTOMY    . COLON RESECTION N/A 09/30/2014   Procedure: DIAGNOSTIC LAPAROSCOPY WITH EXPLORATORY LAP WITH SMALL BOWEL RESECTION AND ANASTAMOSIS;  Surgeon: Ralene Ok, MD;  Location: [facility];  Service: General;  Laterality: N/A;  . COLONOSCOPY    . HEMORROIDECTOMY    . LAMINECTOMY    . RETINAL TEAR REPAIR CRYOTHERAPY Right 06/09/14   repaired with laser  . TONSILLECTOMY       Current Outpatient Medications  Medication Sig  Dispense Refill  . Cholecalciferol (VITAMIN D) 2000 UNITS tablet Take 2,000 Units by mouth daily.    . fexofenadine (ALLEGRA) 30 MG tablet Take 30 mg by mouth daily as needed (for allergies).     Marland Kitchen HUMIRA PEN 40 MG/0.4ML PNKT INJECT 1 PEN UNDER THE SKIN (SUBCUTANEOUS INJECTION) ONCE EVERY 14 DAYS 2 each 11  . hyoscyamine (LEVSIN SL) 0.125 MG SL tablet Take 1-2 tablet every 4-6 hours as needed for abdominal pain/spasm 120 tablet 3   No current facility-administered medications for this visit.     Allergies:   Patient has no known allergies.    Social History:  The patient  reports that he has never smoked. He has never used smokeless tobacco. He reports current alcohol use. He reports that he does not use drugs.   Family History:  The patient's family history includes Colon polyps in his sister; Heart disease in his father, maternal grandfather, and paternal grandfather; Pancreatic cancer in his mother.    ROS:  Please see the history of present illness.   Otherwise, review of systems are positive for none.   All other systems are reviewed and negative.    PHYSICAL EXAM: VS:  BP 134/70   Pulse 70   Ht 5' 8"  (1.727 m)   Wt 194 lb 12.8 oz (88.4 kg)   BMI 29.62 kg/m  , BMI  Body mass index is 29.62 kg/m. GENERAL:  Well appearing HEENT:  Pupils equal round and reactive, fundi not visualized, oral mucosa unremarkable NECK:  No jugular venous distention, waveform within normal limits, carotid upstroke brisk and symmetric, no bruits, no thyromegaly LYMPHATICS:  No cervical, inguinal adenopathy LUNGS:  Clear to auscultation bilaterally BACK:  No CVA tenderness CHEST:  Unremarkable HEART:  PMI not displaced or sustained,S1 and S2 within normal limits, no S3, no S4, no clicks, no rubs, no murmurs ABD:  Flat, positive bowel sounds normal in frequency in pitch, no bruits, no rebound, no guarding, no midline pulsatile mass, no hepatomegaly, no splenomegaly EXT:  2 plus pulses throughout, no  edema, no cyanosis no clubbing SKIN:  No rashes no nodules NEURO:  Cranial nerves II through XII grossly intact, motor grossly intact throughout PSYCH:  Cognitively intact, oriented to person place and time    EKG:  EKG is ordered today. The ekg ordered today demonstrates sinus rhythm, rate 70, axis normal limits, intervals within normal limits, no acute ST-T wave changes.   Recent Labs: 10/16/2018: ALT 23; BUN 15; Creatinine, Ser 0.91; Hemoglobin 15.6; Platelets 218.0; Potassium 4.3; Sodium 140    Lipid Panel    Component Value Date/Time   CHOL 191 04/26/2010 0925   TRIG 212.0 (H) 04/26/2010 0925   HDL 38.80 (L) 04/26/2010 0925   CHOLHDL 5 04/26/2010 0925   VLDL 42.4 (H) 04/26/2010 0925   LDLCALC 110 (H) 12/04/2008 0940   LDLDIRECT 118.8 04/26/2010 0925      Wt Readings from Last 3 Encounters:  03/04/19 194 lb 12.8 oz (88.4 kg)  10/29/18 190 lb (86.2 kg)  06/11/18 198 lb 4 oz (89.9 kg)      Other studies Reviewed: Additional studies/ records that were reviewed today include: Office records and labs. Review of the above records demonstrates:  Please see elsewhere in the note.     ASSESSMENT AND PLAN:  SHOULDER DISCOMFORT/SOB: The patient does have some vague complaints that are somewhat atypical.  He has risk factors including a family history.  I may start with a coronary calcium score.  I will have a low threshold for a POET (Plain Old Exercise Treadmill)  DYSLIPIDEMIA:   LDL was 119.  When I get the calcium score I will calculate a MESA score to determine goals of therapy.   Current medicines are reviewed at length with the patient today.  The patient does not have concerns regarding medicines.  The following changes have been made:  no change  Labs/ tests ordered today include:   Orders Placed This Encounter  Procedures  . CT CARDIAC SCORING  . EKG 12-Lead     Disposition:   FU with me as needed.      Signed, Minus Breeding, MD  03/04/2019 5:34 PM     [facility]

## 2019-03-04 ENCOUNTER — Ambulatory Visit: Payer: Federal, State, Local not specified - PPO | Admitting: Cardiology

## 2019-03-04 ENCOUNTER — Other Ambulatory Visit: Payer: Self-pay

## 2019-03-04 ENCOUNTER — Encounter: Payer: Self-pay | Admitting: Cardiology

## 2019-03-04 VITALS — BP 134/70 | HR 70 | Ht 68.0 in | Wt 194.8 lb

## 2019-03-04 DIAGNOSIS — E785 Hyperlipidemia, unspecified: Secondary | ICD-10-CM | POA: Diagnosis not present

## 2019-03-04 DIAGNOSIS — R072 Precordial pain: Secondary | ICD-10-CM | POA: Diagnosis not present

## 2019-03-04 DIAGNOSIS — Z8249 Family history of ischemic heart disease and other diseases of the circulatory system: Secondary | ICD-10-CM

## 2019-03-04 NOTE — Patient Instructions (Signed)
Medication Instructions:  Your physician recommends that you continue on your current medications as directed. Please refer to the Current Medication list given to you today.  If you need a refill on your cardiac medications before your next appointment, please call your pharmacy.   Lab work: NONE  Testing/Procedures: Coronary Calcium Score  Follow-Up: As needed    Any Other Special Instructions Will Be Listed Below (If Applicable).   Coronary Calcium Scan A coronary calcium scan is an imaging test used to look for deposits of calcium and other fatty materials (plaques) in the inner lining of the blood vessels of the heart (coronary arteries). These deposits of calcium and plaques can partly clog and narrow the coronary arteries without producing any symptoms or warning signs. This puts a person at risk for a heart attack. This test can detect these deposits before symptoms develop. Tell a health care provider about:  Any allergies you have.  All medicines you are taking, including vitamins, herbs, eye drops, creams, and over-the-counter medicines.  Any problems you or family members have had with anesthetic medicines.  Any blood disorders you have.  Any surgeries you have had.  Any medical conditions you have.  Whether you are pregnant or may be pregnant. What are the risks? Generally, this is a safe procedure. However, problems may occur, including:  Harm to a pregnant woman and her unborn baby. This test involves the use of radiation. Radiation exposure can be dangerous to a pregnant woman and her unborn baby. If you are pregnant, you generally should not have this procedure done.  Slight increase in the risk of cancer. This is because of the radiation involved in the test. What happens before the procedure? No preparation is needed for this procedure. What happens during the procedure?   You will undress and remove any jewelry around your neck or chest.  You will  put on a hospital gown.  Sticky electrodes will be placed on your chest. The electrodes will be connected to an electrocardiogram (ECG) machine to record a tracing of the electrical activity of your heart.  A CT scanner will take pictures of your heart. During this time, you will be asked to lie still and hold your breath for 2-3 seconds while a picture of your heart is being taken. The procedure may vary among health care providers and hospitals. What happens after the procedure?  You can get dressed.  You can return to your normal activities.  It is up to you to get the results of your test. Ask your health care provider, or the department that is doing the test, when your results will be ready. Summary  A coronary calcium scan is an imaging test used to look for deposits of calcium and other fatty materials (plaques) in the inner lining of the blood vessels of the heart (coronary arteries).  Generally, this is a safe procedure. Tell your health care provider if you are pregnant or may be pregnant.  No preparation is needed for this procedure.  A CT scanner will take pictures of your heart.  You can return to your normal activities after the scan is done. This information is not intended to replace advice given to you by your health care provider. Make sure you discuss any questions you have with your health care provider. Document Released: 11/11/2007 Document Revised: 04/27/2017 Document Reviewed: 04/03/2016 Elsevier Patient Education  2020 [employer].

## 2019-03-14 ENCOUNTER — Other Ambulatory Visit: Payer: Self-pay

## 2019-03-14 ENCOUNTER — Ambulatory Visit (INDEPENDENT_AMBULATORY_CARE_PROVIDER_SITE_OTHER)
Admission: RE | Admit: 2019-03-14 | Discharge: 2019-03-14 | Disposition: A | Discharge status: Home / Self Care | Payer: Federal, State, Local not specified - PPO | Source: Ambulatory Visit | Attending: Cardiology | Admitting: Cardiology

## 2019-03-14 DIAGNOSIS — R072 Precordial pain: Secondary | ICD-10-CM

## 2019-03-19 ENCOUNTER — Telehealth: Payer: Self-pay

## 2019-03-19 ENCOUNTER — Other Ambulatory Visit: Payer: Self-pay

## 2019-03-19 DIAGNOSIS — Z23 Encounter for immunization: Secondary | ICD-10-CM | POA: Diagnosis not present

## 2019-03-19 DIAGNOSIS — R931 Abnormal findings on diagnostic imaging of heart and coronary circulation: Secondary | ICD-10-CM

## 2019-03-19 NOTE — Telephone Encounter (Signed)
Put in order for ETT. Added Lipitor Per Dr. Percival Spanish. Called pt about ETT. LM2CB

## 2019-04-08 ENCOUNTER — Other Ambulatory Visit (HOSPITAL_COMMUNITY)
Admission: RE | Admit: 2019-04-08 | Discharge: 2019-04-08 | Disposition: A | Discharge status: Home / Self Care | Payer: Federal, State, Local not specified - PPO | Source: Ambulatory Visit | Attending: Cardiology | Admitting: Cardiology

## 2019-04-08 DIAGNOSIS — Z20828 Contact with and (suspected) exposure to other viral communicable diseases: Secondary | ICD-10-CM | POA: Diagnosis not present

## 2019-04-08 DIAGNOSIS — Z01812 Encounter for preprocedural laboratory examination: Secondary | ICD-10-CM | POA: Diagnosis not present

## 2019-04-09 ENCOUNTER — Telehealth (HOSPITAL_COMMUNITY): Payer: Self-pay

## 2019-04-09 NOTE — Telephone Encounter (Signed)
Encounter complete. 

## 2019-04-10 ENCOUNTER — Telehealth: Payer: Self-pay | Admitting: Internal Medicine

## 2019-04-10 LAB — NOVEL CORONAVIRUS, NAA (HOSP ORDER, SEND-OUT TO REF LAB; TAT 18-24 HRS): SARS-CoV-2, NAA: NOT DETECTED

## 2019-04-10 NOTE — Telephone Encounter (Signed)
No, okay to go another 6 months and followup in June 2021

## 2019-04-10 NOTE — Telephone Encounter (Signed)
Pt reports he is doing well, not having any problems. He wants to know if he still needs to set up an OV in December. Please advise.

## 2019-04-10 NOTE — Telephone Encounter (Signed)
Spoke with pt and he is aware.

## 2019-04-11 ENCOUNTER — Other Ambulatory Visit: Payer: Self-pay

## 2019-04-11 ENCOUNTER — Ambulatory Visit (HOSPITAL_COMMUNITY)
Admission: RE | Admit: 2019-04-11 | Discharge: 2019-04-11 | Disposition: A | Discharge status: Home / Self Care | Payer: Federal, State, Local not specified - PPO | Source: Ambulatory Visit | Attending: Internal Medicine | Admitting: Internal Medicine

## 2019-04-11 DIAGNOSIS — R931 Abnormal findings on diagnostic imaging of heart and coronary circulation: Secondary | ICD-10-CM | POA: Diagnosis not present

## 2019-04-11 LAB — EXERCISE TOLERANCE TEST
Estimated workload: 11.7 METS
Exercise duration (min): 10 min
Exercise duration (sec): 0 s
MPHR: 157 {beats}/min
Peak HR: 162 {beats}/min
Percent HR: 103 %
Rest HR: 65 {beats}/min

## 2019-04-14 DIAGNOSIS — Z85828 Personal history of other malignant neoplasm of skin: Secondary | ICD-10-CM | POA: Diagnosis not present

## 2019-04-14 DIAGNOSIS — C44712 Basal cell carcinoma of skin of right lower limb, including hip: Secondary | ICD-10-CM | POA: Diagnosis not present

## 2019-04-14 DIAGNOSIS — D1801 Hemangioma of skin and subcutaneous tissue: Secondary | ICD-10-CM | POA: Diagnosis not present

## 2019-04-14 DIAGNOSIS — D225 Melanocytic nevi of trunk: Secondary | ICD-10-CM | POA: Diagnosis not present

## 2019-04-14 DIAGNOSIS — L821 Other seborrheic keratosis: Secondary | ICD-10-CM | POA: Diagnosis not present

## 2019-06-02 ENCOUNTER — Other Ambulatory Visit: Payer: Self-pay

## 2019-06-02 MED ORDER — HUMIRA (2 PEN) 40 MG/0.4ML ~~LOC~~ AJKT: 40.0000 mg | AUTO-INJECTOR | SUBCUTANEOUS | 11 refills | Status: DC

## 2019-06-16 ENCOUNTER — Telehealth: Payer: Self-pay | Admitting: *Deleted

## 2019-06-16 NOTE — Telephone Encounter (Signed)
Contacted patient to ask that he come for yearly PPD skin test since he is on Humira. He indicates that he is seeing his primary care physician tomorrow and will have testing completed there. He will contact us if for some reason he cannot.

## 2019-06-17 DIAGNOSIS — E7849 Other hyperlipidemia: Secondary | ICD-10-CM | POA: Diagnosis not present

## 2019-06-17 DIAGNOSIS — Z125 Encounter for screening for malignant neoplasm of prostate: Secondary | ICD-10-CM | POA: Diagnosis not present

## 2019-06-17 DIAGNOSIS — R7301 Impaired fasting glucose: Secondary | ICD-10-CM | POA: Diagnosis not present

## 2019-06-19 DIAGNOSIS — R82998 Other abnormal findings in urine: Secondary | ICD-10-CM | POA: Diagnosis not present

## 2019-06-24 DIAGNOSIS — E785 Hyperlipidemia, unspecified: Secondary | ICD-10-CM | POA: Diagnosis not present

## 2019-06-24 DIAGNOSIS — K5090 Crohn's disease, unspecified, without complications: Secondary | ICD-10-CM | POA: Diagnosis not present

## 2019-06-24 DIAGNOSIS — Z1331 Encounter for screening for depression: Secondary | ICD-10-CM | POA: Diagnosis not present

## 2019-06-24 DIAGNOSIS — Z0000 Encounter for general adult medical examination without abnormal findings: Secondary | ICD-10-CM | POA: Diagnosis not present

## 2019-06-24 DIAGNOSIS — R030 Elevated blood-pressure reading, without diagnosis of hypertension: Secondary | ICD-10-CM | POA: Diagnosis not present

## 2019-06-24 DIAGNOSIS — R7301 Impaired fasting glucose: Secondary | ICD-10-CM | POA: Diagnosis not present

## 2019-06-25 DIAGNOSIS — Z1212 Encounter for screening for malignant neoplasm of rectum: Secondary | ICD-10-CM | POA: Diagnosis not present

## 2019-06-26 ENCOUNTER — Telehealth: Payer: Self-pay | Admitting: *Deleted

## 2019-06-26 NOTE — Telephone Encounter (Signed)
-----   Message from Larina Bras, [place] sent at 06/16/2019  2:36 PM EST ----- Did pt have TB skin test at Dr Manya Silvas office? See 06/16/19 telephone note ----- Message ----- From: Larina Bras, CMA Sent: 06/15/2019 To: Larina Bras, CMA  Repeat ppd skin testing for patient. NOT blood testing

## 2019-06-26 NOTE — Telephone Encounter (Signed)
Patient did not receive TB skin test at PCP office. Scheduled appointment for 06/30/19 @ 10 am for our office.

## 2019-06-30 ENCOUNTER — Ambulatory Visit (INDEPENDENT_AMBULATORY_CARE_PROVIDER_SITE_OTHER): Payer: Federal, State, Local not specified - PPO | Admitting: Internal Medicine

## 2019-06-30 ENCOUNTER — Other Ambulatory Visit: Payer: Self-pay

## 2019-06-30 DIAGNOSIS — K5080 Crohn's disease of both small and large intestine without complications: Secondary | ICD-10-CM | POA: Diagnosis not present

## 2019-06-30 NOTE — Patient Instructions (Signed)
Pt will return to on Wed 07/02/2019 for PPD read- Nurse visit.

## 2019-07-02 ENCOUNTER — Encounter: Payer: Federal, State, Local not specified - PPO | Admitting: Internal Medicine

## 2019-07-02 LAB — TB SKIN TEST
Induration: 0 mm
TB Skin Test: NEGATIVE

## 2019-10-15 DIAGNOSIS — C44219 Basal cell carcinoma of skin of left ear and external auricular canal: Secondary | ICD-10-CM | POA: Diagnosis not present

## 2019-10-15 DIAGNOSIS — Z85828 Personal history of other malignant neoplasm of skin: Secondary | ICD-10-CM | POA: Diagnosis not present

## 2019-10-15 DIAGNOSIS — L821 Other seborrheic keratosis: Secondary | ICD-10-CM | POA: Diagnosis not present

## 2019-10-15 DIAGNOSIS — D225 Melanocytic nevi of trunk: Secondary | ICD-10-CM | POA: Diagnosis not present

## 2019-10-15 DIAGNOSIS — D1801 Hemangioma of skin and subcutaneous tissue: Secondary | ICD-10-CM | POA: Diagnosis not present

## 2019-11-05 DIAGNOSIS — C44219 Basal cell carcinoma of skin of left ear and external auricular canal: Secondary | ICD-10-CM | POA: Diagnosis not present

## 2020-01-26 ENCOUNTER — Other Ambulatory Visit: Payer: Self-pay

## 2020-01-26 ENCOUNTER — Encounter: Payer: Self-pay | Admitting: Family Medicine

## 2020-01-26 ENCOUNTER — Ambulatory Visit: Payer: Federal, State, Local not specified - PPO | Admitting: Family Medicine

## 2020-01-26 VITALS — BP 126/72 | Ht 68.0 in | Wt 190.0 lb

## 2020-01-26 DIAGNOSIS — M79604 Pain in right leg: Secondary | ICD-10-CM | POA: Diagnosis not present

## 2020-01-26 NOTE — Progress Notes (Addendum)
PCP: Terry Redwood, MD  Subjective:   HPI: Patient is a 64 y.o. male here for right leg pain.  Mr. Terry Kerr endorses long-term history of right mid buttock pain that radiates down the posterior aspect of his thigh into his calf and extends to his ankle.  He states that this pain is exacerbated by prolonged periods of sitting, including driving.  Alleviating factors include stretching.  He notes he has similar symptoms in the past on his left side that have since resolved and not recurred.  He denies any back pain, numbness, focal muscle weakness.  He denies any involvement of his toes or midfoot.  The most recent exacerbation was this past weekend when he drove to [place].  Mr. Terry Kerr also endorses a several month history of continuous achy sharp pain that involves the superior aspect of the lateral right calf.  Despite his numerous attempts, he has been unable to find any particular trigger for this pain, stating it is not worsened by any exertion.  The pain has not been exacerbated by biking, golfing.  The pain does not move or radiate.  He denies any injuries, falls, trauma.  Alleviating factors have been unable to be identified; he has tried resting, stretches, ibuprofen without relief.  He denies any overlying skin changes, swelling, redness.  Mr. Terry Kerr states he exercises several times per week by lifting weights and biking with his wife.  He notes previous injuries include left fifth metatarsal fracture, left wrist fracture.  Past Medical History:  Diagnosis Date  . Allergic rhinitis    pollen, animal hair, dust mites  . Allergy   . Crohn disease ([place])   . Diverticulosis   . Hemorrhoids, internal, thrombosed s/p resection/pexy 12/06/2012 12/04/2012  . HLD (hyperlipidemia)   . Obesity   . Regional enteritis (Crohn's disease) ([place]) 08/01/2007   Qualifier: Diagnosis of  By: Council Mechanic MD, Terry Kerr   . Small bowel obstruction [facility])     Current Outpatient Medications on File Prior to Visit   Medication Sig Dispense Refill  . Adalimumab (HUMIRA PEN) 40 MG/0.4ML PNKT Inject 40 mg into the skin every 14 (fourteen) days. 2 each 11  . atorvastatin (LIPITOR) 40 MG tablet Take 40 mg by mouth daily.    . Cholecalciferol (VITAMIN D) 2000 UNITS tablet Take 2,000 Units by mouth daily.    . fexofenadine (ALLEGRA) 30 MG tablet Take 30 mg by mouth daily as needed (for allergies).     . hyoscyamine (LEVSIN SL) 0.125 MG SL tablet Take 1-2 tablet every 4-6 hours as needed for abdominal pain/spasm 120 tablet 3   No current facility-administered medications on file prior to visit.    Past Surgical History:  Procedure Laterality Date  . APPENDECTOMY    . COLON RESECTION N/A 09/30/2014   Procedure: DIAGNOSTIC LAPAROSCOPY WITH EXPLORATORY LAP WITH SMALL BOWEL RESECTION AND ANASTAMOSIS;  Surgeon: Terry Ok, MD;  Location: [facility];  Service: General;  Laterality: N/A;  . COLONOSCOPY    . HEMORROIDECTOMY    . LAMINECTOMY    . RETINAL TEAR REPAIR CRYOTHERAPY Right 06/09/14   repaired with laser  . TONSILLECTOMY      No Known Allergies  Social History   Socioeconomic History  . Marital status: Married    Spouse name: Not on file  . Number of children: 1  . Years of education: Not on file  . Highest education level: Not on file  Occupational History  . Occupation: [employer]  Tobacco Use  . Smoking status:  Never Smoker  . Smokeless tobacco: Never Used  Substance and Sexual Activity  . Alcohol use: Yes    Alcohol/week: 0.0 standard drinks    Comment: glass of wine occasionally   . Drug use: No  . Sexual activity: Not on file  Other Topics Concern  . Not on file  Social History Narrative  . Not on file   Social Determinants of Health   Financial Resource Strain:   . Difficulty of Paying Living Expenses: Not on file  Food Insecurity:   . Worried About Charity fundraiser in the Last Year: Not on file  . Ran Out of Food in the Last Year: Not on file  Transportation Needs:    . Lack of Transportation (Medical): Not on file  . Lack of Transportation (Non-Medical): Not on file  Physical Activity:   . Days of Exercise per Week: Not on file  . Minutes of Exercise per Session: Not on file  Stress:   . Feeling of Stress : Not on file  Social Connections:   . Frequency of Communication with Friends and Family: Not on file  . Frequency of Social Gatherings with Friends and Family: Not on file  . Attends Religious Services: Not on file  . Active Member of Clubs or Organizations: Not on file  . Attends Archivist Meetings: Not on file  . Marital Status: Not on file  Intimate Partner Violence:   . Fear of Current or Ex-Partner: Not on file  . Emotionally Abused: Not on file  . Physically Abused: Not on file  . Sexually Abused: Not on file    Family History  Problem Relation Age of Onset  . Pancreatic cancer Mother        developed bowel blockage  . Heart disease Father   . Heart disease Maternal Grandfather   . Heart disease Paternal Grandfather   . Colon polyps Sister   . Colon cancer Neg Hx   . Rectal cancer Neg Hx   . Stomach cancer Neg Hx     BP 126/72   Ht 5' 8"  (1.727 m)   Wt 190 lb (86.2 kg)   BMI 28.89 kg/m   Review of Systems: See HPI above.     Objective:  Physical Exam:  Gen: NAD, comfortable in exam room  Right knee/calf:  No gross deformities on inspection.  No edema or effusion present.   No TTP along the knee joint line, no TTP along the posterior calf.  Full ROM intact.  5 out of 5 strength with dorsiflexion, plantarflexion, foot eversion and inversion, lower extremity flexion and extension.  Right hip:  No gross deformities on inspection.  TTP overlying the piriformis with deep palpation. No other TTP.  Full ROM intact.  Tightness with SLR and piriformis stretch but no pain. 5/5 strength with flexion and extension.  Negative Faber's and Fadir's.   Lumbar back:  Negative bilateral straight leg raise.    Assessment & Plan:  1.  Right piriformis syndrome: History consistent with piriformis syndrome. I suspect patients right calf pain is also secondary to the compression of the sciatic nerve given there is no muscular pain, nor movements that exacerbate the pain. Recommend conservative management with home stretches. Information on this was provided in the clinic. Follow up in 6 weeks.

## 2020-01-26 NOTE — Patient Instructions (Signed)
You have piriformis syndrome causing sciatica. Try to avoid painful activities when possible. Pick 2-3 stretches where you feel the pull in the area of pain - do 3 of these and hold for 20-30 seconds twice a day. Strengthening exercises 3 sets of 10 once a day only. Ibuprofen 669m three times a day with food OR aleve 2 tabs twice a day with food for pain and inflammation as needed. Consider tennis ball to massage area when sitting. Consider physical therapy. Follow up with me in 6 weeks.

## 2020-03-08 ENCOUNTER — Ambulatory Visit: Payer: Federal, State, Local not specified - PPO | Admitting: Family Medicine

## 2020-03-27 DIAGNOSIS — Z23 Encounter for immunization: Secondary | ICD-10-CM | POA: Diagnosis not present

## 2020-04-13 DIAGNOSIS — L821 Other seborrheic keratosis: Secondary | ICD-10-CM | POA: Diagnosis not present

## 2020-04-13 DIAGNOSIS — L814 Other melanin hyperpigmentation: Secondary | ICD-10-CM | POA: Diagnosis not present

## 2020-04-13 DIAGNOSIS — C44529 Squamous cell carcinoma of skin of other part of trunk: Secondary | ICD-10-CM | POA: Diagnosis not present

## 2020-04-13 DIAGNOSIS — Z85828 Personal history of other malignant neoplasm of skin: Secondary | ICD-10-CM | POA: Diagnosis not present

## 2020-04-13 DIAGNOSIS — C44319 Basal cell carcinoma of skin of other parts of face: Secondary | ICD-10-CM | POA: Diagnosis not present

## 2020-04-13 DIAGNOSIS — L309 Dermatitis, unspecified: Secondary | ICD-10-CM | POA: Diagnosis not present

## 2020-04-28 DIAGNOSIS — C44529 Squamous cell carcinoma of skin of other part of trunk: Secondary | ICD-10-CM | POA: Diagnosis not present

## 2020-05-04 ENCOUNTER — Other Ambulatory Visit: Payer: Self-pay

## 2020-05-04 MED ORDER — HUMIRA (2 PEN) 40 MG/0.4ML ~~LOC~~ AJKT: 40.0000 mg | AUTO-INJECTOR | SUBCUTANEOUS | 11 refills | Status: DC

## 2020-05-10 DIAGNOSIS — C44319 Basal cell carcinoma of skin of other parts of face: Secondary | ICD-10-CM | POA: Diagnosis not present

## 2020-05-19 DIAGNOSIS — D485 Neoplasm of uncertain behavior of skin: Secondary | ICD-10-CM | POA: Diagnosis not present

## 2020-05-19 DIAGNOSIS — C44319 Basal cell carcinoma of skin of other parts of face: Secondary | ICD-10-CM | POA: Diagnosis not present

## 2020-05-24 ENCOUNTER — Other Ambulatory Visit: Payer: Self-pay | Admitting: Internal Medicine

## 2020-06-15 DIAGNOSIS — C44319 Basal cell carcinoma of skin of other parts of face: Secondary | ICD-10-CM | POA: Diagnosis not present

## 2020-06-18 DIAGNOSIS — E785 Hyperlipidemia, unspecified: Secondary | ICD-10-CM | POA: Diagnosis not present

## 2020-06-18 DIAGNOSIS — R7301 Impaired fasting glucose: Secondary | ICD-10-CM | POA: Diagnosis not present

## 2020-06-18 DIAGNOSIS — Z125 Encounter for screening for malignant neoplasm of prostate: Secondary | ICD-10-CM | POA: Diagnosis not present

## 2020-06-25 DIAGNOSIS — Z0000 Encounter for general adult medical examination without abnormal findings: Secondary | ICD-10-CM | POA: Diagnosis not present

## 2020-06-25 DIAGNOSIS — R030 Elevated blood-pressure reading, without diagnosis of hypertension: Secondary | ICD-10-CM | POA: Diagnosis not present

## 2020-06-25 DIAGNOSIS — Z23 Encounter for immunization: Secondary | ICD-10-CM | POA: Diagnosis not present

## 2020-06-25 DIAGNOSIS — Z1331 Encounter for screening for depression: Secondary | ICD-10-CM | POA: Diagnosis not present

## 2020-06-25 DIAGNOSIS — R82998 Other abnormal findings in urine: Secondary | ICD-10-CM | POA: Diagnosis not present

## 2020-06-25 DIAGNOSIS — Z1339 Encounter for screening examination for other mental health and behavioral disorders: Secondary | ICD-10-CM | POA: Diagnosis not present

## 2020-07-02 ENCOUNTER — Telehealth: Payer: Self-pay | Admitting: *Deleted

## 2020-07-02 NOTE — Telephone Encounter (Signed)
-----   Message from Larina Bras, [place] sent at 07/02/2019  5:25 PM EST -----  ----- Message ----- From: Larina Bras, CMA Sent: 07/02/2019   5:25 PM EST To: Larina Bras, CMA   ----- Message ----- From: Larina Bras, CMA Sent: 07/02/2019   5:13 PM EST To: Larina Bras, CMA  Needs tb skin testing. See 07/02/19 tb note.Marland KitchenMarland KitchenMarland Kitchen

## 2020-07-02 NOTE — Telephone Encounter (Signed)
Dr Terry Kerr- Patient is due for TB skin testing. HOWEVER, he has not been seen in the office since 10/2018. He is also due for colonoscopy 06/2020. Would you like him to go ahead and come to office first for full examination and labs or would you like him to go forward with colonoscopy only? Please advise.Marland KitchenMarland Kitchen

## 2020-07-02 NOTE — Telephone Encounter (Signed)
Ok for direct to colon, but office visit also reasonable. If colon before OV then he needs to come for quant gold, cbc, cmp Thanks

## 2020-07-05 NOTE — Telephone Encounter (Signed)
Patient is scheduled for office visit 08/19/20. He will have labs at that time.

## 2020-07-20 DIAGNOSIS — C44319 Basal cell carcinoma of skin of other parts of face: Secondary | ICD-10-CM | POA: Diagnosis not present

## 2020-08-19 ENCOUNTER — Ambulatory Visit (INDEPENDENT_AMBULATORY_CARE_PROVIDER_SITE_OTHER): Payer: Federal, State, Local not specified - PPO | Admitting: Internal Medicine

## 2020-08-19 ENCOUNTER — Other Ambulatory Visit: Payer: Self-pay

## 2020-08-19 ENCOUNTER — Encounter: Payer: Self-pay | Admitting: Internal Medicine

## 2020-08-19 VITALS — BP 119/60 | HR 64 | Ht 68.0 in | Wt 190.6 lb

## 2020-08-19 DIAGNOSIS — Z85828 Personal history of other malignant neoplasm of skin: Secondary | ICD-10-CM | POA: Diagnosis not present

## 2020-08-19 DIAGNOSIS — K5080 Crohn's disease of both small and large intestine without complications: Secondary | ICD-10-CM | POA: Diagnosis not present

## 2020-08-19 NOTE — Patient Instructions (Addendum)
You have been scheduled for TB skin testing on Friday, 09/17/20 at 4:00 pm. You should ask for this test to be read on the day of your colonoscopy.  You have been scheduled for a colonoscopy. Please follow written instructions given to you at your visit today.  Please pick up your prep supplies at the pharmacy within the next 1-3 days. If you use inhalers (even only as needed), please bring them with you on the day of your procedure.  If you are age 65 or older, your body mass index should be between 23-30. Your Body mass index is 28.98 kg/m. If this is out of the aforementioned range listed, please consider follow up with your Primary Care Provider.  Due to recent changes in healthcare laws, you may see the results of your imaging and laboratory studies on MyChart before your provider has had a chance to review them.  We understand that in some cases there may be results that are confusing or concerning to you. Not all laboratory results come back in the same time frame and the provider may be waiting for multiple results in order to interpret others.  Please give [place] 48 hours in order for your provider to thoroughly review all the results before contacting the office for clarification of your results.

## 2020-08-19 NOTE — Progress Notes (Addendum)
Subjective:    Patient ID: Terry Kerr, male    DOB: 1955/07/20, 65 y.o.   MRN: [id number]  HPI Harrel Ferrone is a 65 year old male with a history of ileocolonic Crohn's disease with ileal stricture status post segmental small bowel resection in May 2016 who is here for follow-up.  He was last seen on 10/29/2018.  He has been maintained on Humira 40 mg every 14 days.  He reports of late he has been doing quite well.  He did have a flare of his Crohn's disease requiring budesonide therapy in May 2020.  In the last year he has done very well with no abdominal pain.  He is able to eat a diet which is varied and pretty much what he would like to eat.  No abdominal pain.  Regular bowel movements.  No blood in stool or melena.  No nausea, vomiting, uncontrolled heartburn, dysphagia or odynophagia.  He has had another what sounds like squamous cell skin cancer removed with Mohs which is his fourth such removal.  He has had lesions removed from the bilateral ear, chest and most recently forehead.  He also had some type of rash versus neoplasia above his eyes bilaterally.  He reports this was treated with a topical "chemotherapy".  He sees Dr. Elvera Lennox at [facility] dermatology and Dr. Sarajane Jews was his Mohs surgeon.  His wife and daughter are doing well.  He remains retired but has considered going back to work.  He turned 65 years old today.   Review of Systems As per HPI, otherwise negative  Current Medications, Allergies, Past Medical History, Past Surgical History, Family History and Social History were reviewed in [employer] record.     Objective:   Physical Exam BP 119/60   Pulse 64   Ht 5' 8"  (1.727 m)   Wt 190 lb 9.6 oz (86.5 kg)   SpO2 97%   BMI 28.98 kg/m  Gen: awake, alert, NAD HEENT: anicteric, no lesions on the forehead though there is some hypopigmentation and loss of his eyebrows bilaterally CV: RRR, no mrg Pulm: CTA b/l Abd: soft, NT/ND, +BS  throughout, diastases recti, palpable suture in subcutaneous tissue at well-healed umbilical scar Ext: no c/c/e Neuro: nonfocal  CBC    Component Value Date/Time   WBC 8.0 10/16/2018 0927   RBC 4.90 10/16/2018 0927   HGB 15.6 10/16/2018 0927   HCT 44.9 10/16/2018 0927   PLT 218.0 10/16/2018 0927   MCV 91.6 10/16/2018 0927   MCH 30.4 10/05/2014 0428   MCHC 34.8 10/16/2018 0927   RDW 13.3 10/16/2018 0927   LYMPHSABS 1.9 10/16/2018 0927   MONOABS 0.6 10/16/2018 0927   EOSABS 0.1 10/16/2018 0927   BASOSABS 0.0 10/16/2018 0927   CMP     Component Value Date/Time   NA 140 10/16/2018 0927   K 4.3 10/16/2018 0927   CL 105 10/16/2018 0927   CO2 27 10/16/2018 0927   GLUCOSE 97 10/16/2018 0927   BUN 15 10/16/2018 0927   CREATININE 0.91 10/16/2018 0927   CALCIUM 9.0 10/16/2018 0927   PROT 6.7 10/16/2018 0927   ALBUMIN 4.0 10/16/2018 0927   AST 15 10/16/2018 0927   ALT 23 10/16/2018 0927   ALKPHOS 55 10/16/2018 0927   BILITOT 0.7 10/16/2018 0927   GFRNONAA >60 10/05/2014 0428   GFRAA >60 10/05/2014 0428       Assessment & Plan:  65 year old male with a history of ileocolonic Crohn's disease with ileal stricture status post  segmental small bowel resection in May 2016 who is here for follow-up.   1.  Ileocolonic Crohn's disease --his disease is in apparent remission now with last flare being in mid 2020.  He does have history of stricture requiring resection in 2016.  Given his history of Crohn's disease with stricture/complication we have continued him on biologic therapy.  We did have a discussion regarding the risks of biologic therapy today and other possible options going forward, see #2.  For now I recommend we continue Humira 40 mg every 14 days and surveillance colonoscopy is recommended. --Continue Humira 40 mg every 14 days --Surveillance colonoscopy recommended in the [place]; we reviewed the risk, benefits and alternatives and he is agreeable and wishes to proceed --TB skin  test to be performed later next month  2.  Squamous cell skin cancer --we discussed that there is an association with IBD and particularly anti-TNF therapy and squamous cell skin cancer.  I will attempt to contact Dr. Elvera Lennox to discuss his history of skin cancer and possibly consider changing Humira to another therapy such as Entyvio or Stelara.  I also want to confirm with Dr. Elvera Lennox if the lesion treated with "chemotherapy" could be possibly related to medication side effect or even pyoderma.  Office follow-up in a year or approximately a year after colonoscopy  30 minutes total spent today including patient facing time, coordination of care, reviewing medical history/procedures/pertinent radiology studies, and documentation of the encounter.  Addendum:  I spoke with Dr. Elvera Lennox with [facility] dermatology regarding patient's skin history.  He has had several basal cell skin cancers which have been treated with Mohs.  He also took imiquimod under direction of Dr. Sarajane Jews.  Previous biopsies from the eyebrow area revealed a chronic perifolliculitis.  No prior melanomas or advanced skin cancers.  Dr. Elvera Lennox stated that the data that anti-TNF therapy leads to skin cancer is weak and if the patient's got is healed with Humira that he does not have a problem continuing it or see a reason to change therapy.  Given current clinical remission I would recommend that we continue Humira.

## 2020-08-26 NOTE — Progress Notes (Signed)
I have spoken to patient to advise that Dr Hilarie Fredrickson and Dr Elvera Lennox have spoken regarding any relation between patient's skin cancers and anti TNF therapies. Dr Hilarie Fredrickson indicates that Dr Elvera Lennox feels the data backing this thinking is weak and thinks if the patient has done well with Humira, that he should continue it. Dr Hilarie Fredrickson recommends patient continue the Humira as well. Patient verbalizes understanding of this information and thanks [place] for the follow up.

## 2020-08-31 DIAGNOSIS — C44319 Basal cell carcinoma of skin of other parts of face: Secondary | ICD-10-CM | POA: Diagnosis not present

## 2020-09-14 ENCOUNTER — Telehealth: Payer: Self-pay | Admitting: Internal Medicine

## 2020-09-14 DIAGNOSIS — K5080 Crohn's disease of both small and large intestine without complications: Secondary | ICD-10-CM

## 2020-09-14 MED ORDER — SUTAB 1479-225-188 MG PO TABS: ORAL_TABLET | ORAL | 0 refills | Status: DC

## 2020-09-14 NOTE — Telephone Encounter (Signed)
Patient states pharmacy does not have record of RX for Sutab. Could you please call to [facility] on [address].

## 2020-09-14 NOTE — Telephone Encounter (Signed)
Rx sent 

## 2020-09-17 ENCOUNTER — Ambulatory Visit (INDEPENDENT_AMBULATORY_CARE_PROVIDER_SITE_OTHER): Payer: Federal, State, Local not specified - PPO | Admitting: Internal Medicine

## 2020-09-17 DIAGNOSIS — K5080 Crohn's disease of both small and large intestine without complications: Secondary | ICD-10-CM

## 2020-09-20 ENCOUNTER — Ambulatory Visit (AMBULATORY_SURGERY_CENTER): Payer: Federal, State, Local not specified - PPO | Admitting: Internal Medicine

## 2020-09-20 ENCOUNTER — Other Ambulatory Visit: Payer: Self-pay

## 2020-09-20 ENCOUNTER — Encounter: Payer: Self-pay | Admitting: Internal Medicine

## 2020-09-20 VITALS — BP 129/65 | HR 56 | Temp 97.3°F | Resp 14 | Ht 68.0 in | Wt 190.0 lb

## 2020-09-20 DIAGNOSIS — K52832 Lymphocytic colitis: Secondary | ICD-10-CM

## 2020-09-20 DIAGNOSIS — K5090 Crohn's disease, unspecified, without complications: Secondary | ICD-10-CM | POA: Diagnosis not present

## 2020-09-20 DIAGNOSIS — K5080 Crohn's disease of both small and large intestine without complications: Secondary | ICD-10-CM | POA: Diagnosis not present

## 2020-09-20 LAB — TB SKIN TEST
Induration: 0 mm
TB Skin Test: NEGATIVE

## 2020-09-20 MED ORDER — SODIUM CHLORIDE 0.9 % IV SOLN: 500.0000 mL | INTRAVENOUS | Status: DC

## 2020-09-20 NOTE — Patient Instructions (Signed)
Continue Humira as directed.  Read all of the handouts given to you by you recovery room nurse.  YOU HAD AN ENDOSCOPIC PROCEDURE TODAY AT [facility]:   Refer to the procedure report that was given to you for any specific questions about what was found during the examination.  If the procedure report does not answer your questions, please call your gastroenterologist to clarify.  If you requested that your care partner not be given the details of your procedure findings, then the procedure report has been included in a sealed envelope for you to review at your convenience later.  YOU SHOULD EXPECT: Some feelings of bloating in the abdomen. Passage of more gas than usual.  Walking can help get rid of the air that was put into your GI tract during the procedure and reduce the bloating. If you had a lower endoscopy (such as a colonoscopy or flexible sigmoidoscopy) you may notice spotting of blood in your stool or on the toilet paper. If you underwent a bowel prep for your procedure, you may not have a normal bowel movement for a few days.  Please Note:  You might notice some irritation and congestion in your nose or some drainage.  This is from the oxygen used during your procedure.  There is no need for concern and it should clear up in a day or so.  SYMPTOMS TO REPORT IMMEDIATELY:   Following lower endoscopy (colonoscopy or flexible sigmoidoscopy):  Excessive amounts of blood in the stool  Significant tenderness or worsening of abdominal pains  Swelling of the abdomen that is new, acute  Fever of 100F or higher   For urgent or emergent issues, a gastroenterologist can be reached at any hour by calling [phone]. Do not use MyChart messaging for urgent concerns.    DIET:  We do recommend a small meal at first, but then you may proceed to your regular diet.  Drink plenty of fluids but you should avoid alcoholic beverages for 24 hours.  Be sure to drink plenty of  water.  ACTIVITY:  You should plan to take it easy for the rest of today and you should NOT DRIVE or use heavy machinery until tomorrow (because of the sedation medicines used during the test).    FOLLOW UP: Our staff will call the number listed on your records 48-72 hours following your procedure to check on you and address any questions or concerns that you may have regarding the information given to you following your procedure. If we do not reach you, we will leave a message.  We will attempt to reach you two times.  During this call, we will ask if you have developed any symptoms of COVID 19. If you develop any symptoms (ie: fever, flu-like symptoms, shortness of breath, cough etc.) before then, please call [phone].  If you test positive for Covid 19 in the 2 weeks post procedure, please call and report this information to [place].    If any biopsies were taken you will be contacted by phone or by letter within the next 1-3 weeks.  Please call us at [phone] if you have not heard about the biopsies in 3 weeks.    SIGNATURES/CONFIDENTIALITY: You and/or your care partner have signed paperwork which will be entered into your electronic medical record.  These signatures attest to the fact that that the information above on your After Visit Summary has been reviewed and is understood.  Full responsibility of the confidentiality of  this discharge information lies with you and/or your care-partner.

## 2020-09-20 NOTE — Progress Notes (Signed)
PT taken to PACU. Monitors in place. VSS. Report given to RN. 

## 2020-09-20 NOTE — Op Note (Signed)
[place] Patient Name: Terry Kerr Procedure Date: 09/20/2020 11:16 AM MRN: [id number] Endoscopist: Jerene Bears , MD Age: 65 Referring MD:  Date of Birth: 1956-01-11 Gender: Male Account #: [id number] Procedure:                Colonoscopy Indications:              High risk colon cancer surveillance: history of                            ileocolonic Crohn's of 8 (or more) years duration,                            Last colonoscopy: January 2019 Medicines:                Monitored Anesthesia Care Procedure:                Pre-Anesthesia Assessment:                           - Prior to the procedure, a History and Physical                            was performed, and patient medications and                            allergies were reviewed. The patient's tolerance of                            previous anesthesia was also reviewed. The risks                            and benefits of the procedure and the sedation                            options and risks were discussed with the patient.                            All questions were answered, and informed consent                            was obtained. Prior Anticoagulants: The patient has                            taken no previous anticoagulant or antiplatelet                            agents. ASA Grade Assessment: II - A patient with                            mild systemic disease. After reviewing the risks                            and benefits, the patient was deemed in  satisfactory condition to undergo the procedure.                           After obtaining informed consent, the colonoscope                            was passed under direct vision. Throughout the                            procedure, the patient's blood pressure, pulse, and                            oxygen saturations were monitored continuously. The                            Olympus PCF-H190DL (#[id number])  Colonoscope was                            introduced through the anus and advanced to the                            terminal ileum. The colonoscopy was performed                            without difficulty. The patient tolerated the                            procedure well. The quality of the bowel                            preparation was good. The terminal ileum, ileocecal                            valve, appendiceal orifice, and rectum were                            photographed. Scope In: 11:21:48 AM Scope Out: 11:39:40 AM Scope Withdrawal Time: 0 hours 14 minutes 33 seconds  Total Procedure Duration: 0 hours 17 minutes 52 seconds  Findings:                 The digital rectal exam was normal.                           The terminal ileum appeared normal.                           The Simple Endoscopic Score for Crohn's Disease was                            determined based on the endoscopic appearance of                            the mucosa in the following segments:                           -  Ileum: Findings include no ulcers present, no                            ulcerated surfaces, no affected surfaces and no                            narrowings. Segment score: 0.                           - Right Colon: Findings include no ulcers present,                            no ulcerated surfaces, no affected surfaces and no                            narrowings. Segment score: 0.                           - Transverse Colon: Findings include no ulcers                            present, no ulcerated surfaces, no affected                            surfaces and no narrowings. Segment score: 0.                           - Left Colon: Findings include no ulcers present,                            no ulcerated surfaces, no affected surfaces and no                            narrowings. Segment score: 0.                           - Rectum: Findings include no ulcers present, no                             ulcerated surfaces, no affected surfaces and no                            narrowings. Segment score: 0.                           - Total SES-CD aggregate score: 0. Four biopsies                            were taken every 10 cm with a cold forceps from the                            entire colon for Crohn's disease surveillance and  dysplasia surveillance. These biopsy specimens from                            the right colon and left colon were sent to                            Pathology.                           Multiple small and large-mouthed diverticula were                            found in the sigmoid colon, hepatic flexure and                            ascending colon.                           A small fistula was found in the distal rectum. Complications:            No immediate complications. Estimated Blood Loss:     Estimated blood loss was minimal. Impression:               - The examined portion of the ileum was normal.                           - Simple Endoscopic Score for Crohn's Disease: 0,                            mucosal inflammatory changes secondary to Crohn's                            disease, in remission. Biopsied.                           - Diverticulosis in the sigmoid colon, at the                            hepatic flexure and in the ascending colon.                           - Small fistulous opening in distal rectum without                            inflammation (likely inactive). Recommendation:           - Patient has a contact number available for                            emergencies. The signs and symptoms of potential                            delayed complications were discussed with the                            patient. Return to normal activities  tomorrow.                            Written discharge instructions were provided to the                            patient.                            - Resume previous diet.                           - Continue present medications. Continue Humira at                            current dose.                           - Await pathology results.                           - Repeat colonoscopy is recommended for                            surveillance. The colonoscopy date will be                            determined after pathology results from today's                            exam become available for review. Jerene Bears, MD 09/20/2020 11:49:14 AM This report has been signed electronically.

## 2020-09-22 ENCOUNTER — Telehealth: Payer: Self-pay

## 2020-09-22 NOTE — Telephone Encounter (Signed)
LVM

## 2020-10-01 ENCOUNTER — Encounter: Payer: Self-pay | Admitting: Internal Medicine

## 2020-10-11 DIAGNOSIS — C44612 Basal cell carcinoma of skin of right upper limb, including shoulder: Secondary | ICD-10-CM | POA: Diagnosis not present

## 2020-10-11 DIAGNOSIS — L814 Other melanin hyperpigmentation: Secondary | ICD-10-CM | POA: Diagnosis not present

## 2020-10-11 DIAGNOSIS — C44519 Basal cell carcinoma of skin of other part of trunk: Secondary | ICD-10-CM | POA: Diagnosis not present

## 2020-10-11 DIAGNOSIS — D1801 Hemangioma of skin and subcutaneous tissue: Secondary | ICD-10-CM | POA: Diagnosis not present

## 2020-10-11 DIAGNOSIS — D225 Melanocytic nevi of trunk: Secondary | ICD-10-CM | POA: Diagnosis not present

## 2020-10-11 DIAGNOSIS — C44311 Basal cell carcinoma of skin of nose: Secondary | ICD-10-CM | POA: Diagnosis not present

## 2020-10-11 DIAGNOSIS — Z85828 Personal history of other malignant neoplasm of skin: Secondary | ICD-10-CM | POA: Diagnosis not present

## 2020-11-10 DIAGNOSIS — C44311 Basal cell carcinoma of skin of nose: Secondary | ICD-10-CM | POA: Diagnosis not present

## 2020-11-26 DIAGNOSIS — H903 Sensorineural hearing loss, bilateral: Secondary | ICD-10-CM | POA: Diagnosis not present

## 2020-12-20 ENCOUNTER — Encounter: Payer: Self-pay | Admitting: Internal Medicine

## 2021-01-25 DIAGNOSIS — H903 Sensorineural hearing loss, bilateral: Secondary | ICD-10-CM | POA: Diagnosis not present

## 2021-03-19 DIAGNOSIS — Z23 Encounter for immunization: Secondary | ICD-10-CM | POA: Diagnosis not present

## 2021-04-13 DIAGNOSIS — D225 Melanocytic nevi of trunk: Secondary | ICD-10-CM | POA: Diagnosis not present

## 2021-04-13 DIAGNOSIS — L570 Actinic keratosis: Secondary | ICD-10-CM | POA: Diagnosis not present

## 2021-04-13 DIAGNOSIS — D1801 Hemangioma of skin and subcutaneous tissue: Secondary | ICD-10-CM | POA: Diagnosis not present

## 2021-04-13 DIAGNOSIS — C44719 Basal cell carcinoma of skin of left lower limb, including hip: Secondary | ICD-10-CM | POA: Diagnosis not present

## 2021-04-13 DIAGNOSIS — Z85828 Personal history of other malignant neoplasm of skin: Secondary | ICD-10-CM | POA: Diagnosis not present

## 2021-04-13 DIAGNOSIS — L814 Other melanin hyperpigmentation: Secondary | ICD-10-CM | POA: Diagnosis not present

## 2021-05-09 ENCOUNTER — Other Ambulatory Visit: Payer: Self-pay | Admitting: Internal Medicine

## 2021-06-06 IMAGING — CT CT HEART SCORING
2 series · 16 of 20 positions shown, 18 images · non-contrast
Comparison: None.
COMPARISON: None.

Addendum:
EXAM:
OVER-READ INTERPRETATION  CT CHEST

The following report is an over-read performed by radiologist Dr.
Ginna Tiger [REDACTED] on 03/14/2019. This
over-read does not include interpretation of cardiac or coronary
anatomy or pathology. The coronary calcium score interpretation by
the cardiologist is attached.
CLINICAL DATA: Risk stratification
Coronary Calcium Score
TECHNIQUE: The patient was scanned on a Siemens Force scanner. Axial
non-contrast 3 mm slices were carried out through the heart. The
data set was analyzed on a dedicated work station and scored using
the Agatson method.

[Series 3: casc 3.0 i36f 2 bestdiast 72 % · axial · 0.40mm/px · z∈[-196,-112]mm · 8 of 36 slices shown, 10 images]
[im 4/36  vessel]
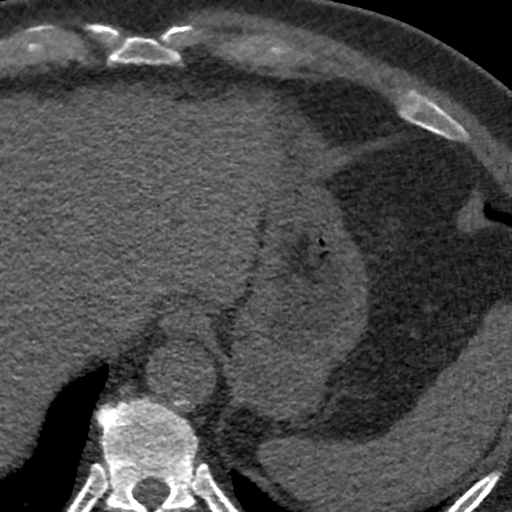
[im 4/36  lung]
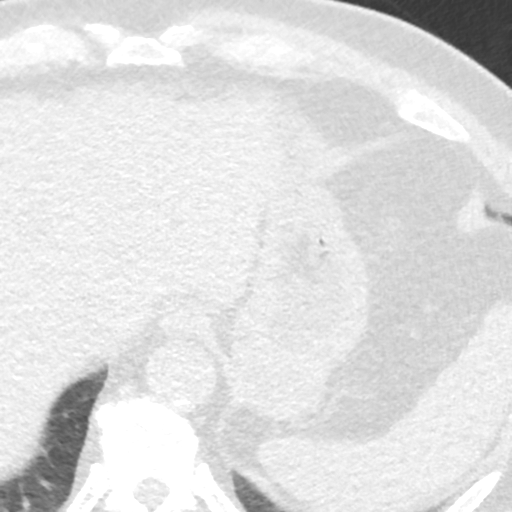
[im 8/36  vessel]
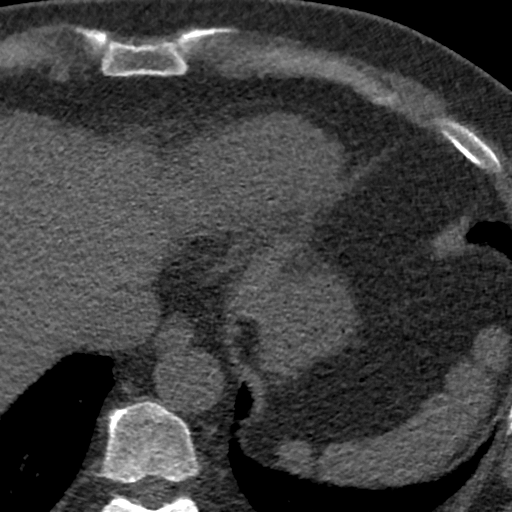
[im 12/36  vessel]
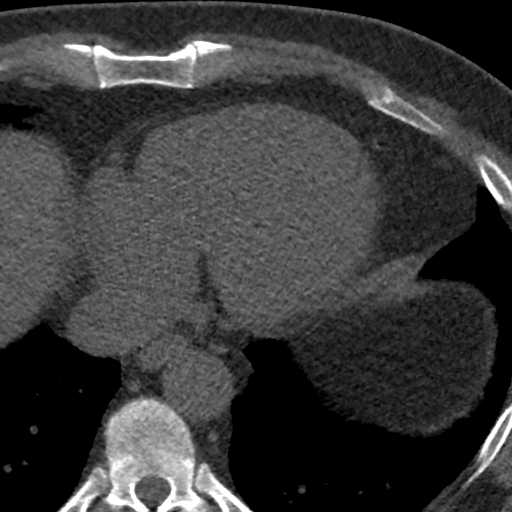
[im 16/36  vessel]
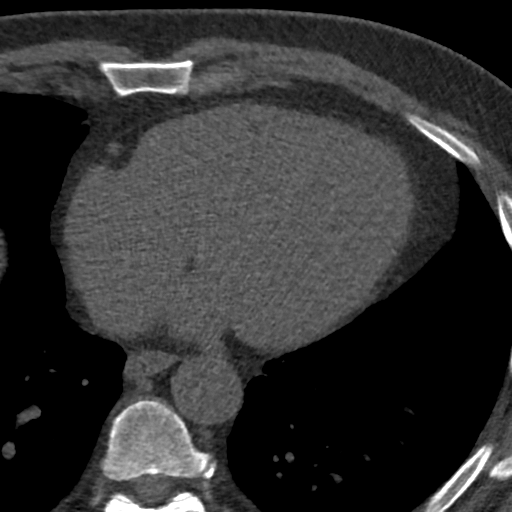
[im 20/36  vessel]
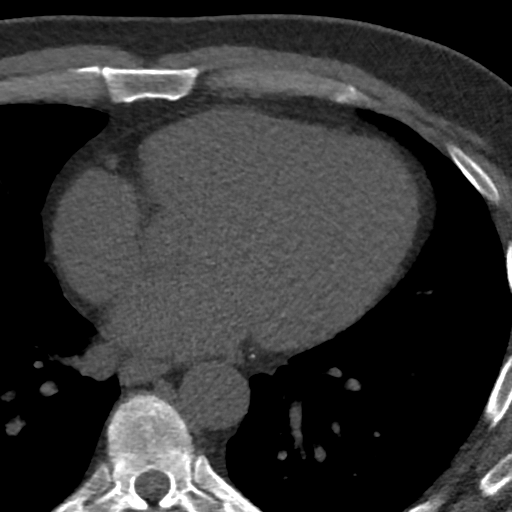
[im 20/36  lung]
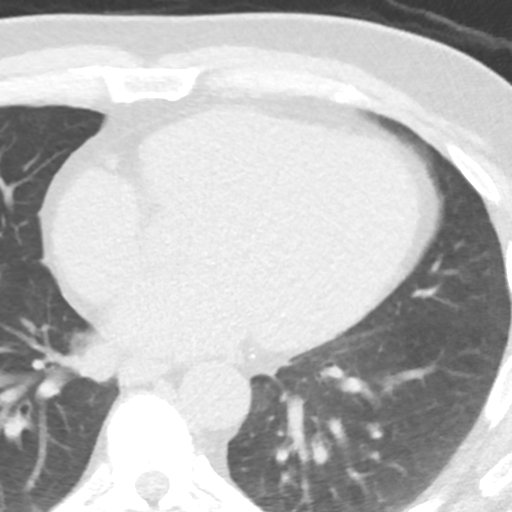
[im 24/36  vessel]
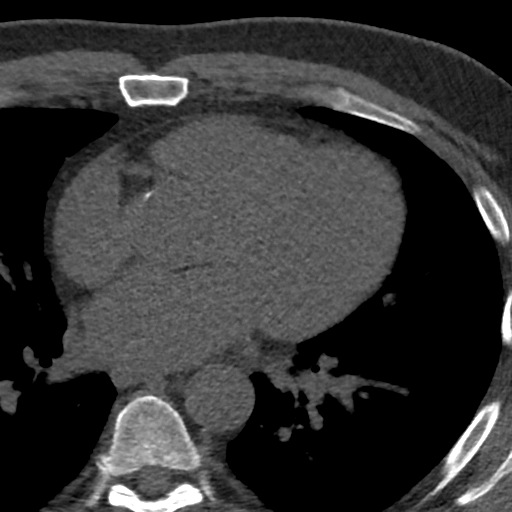
[im 28/36  vessel]
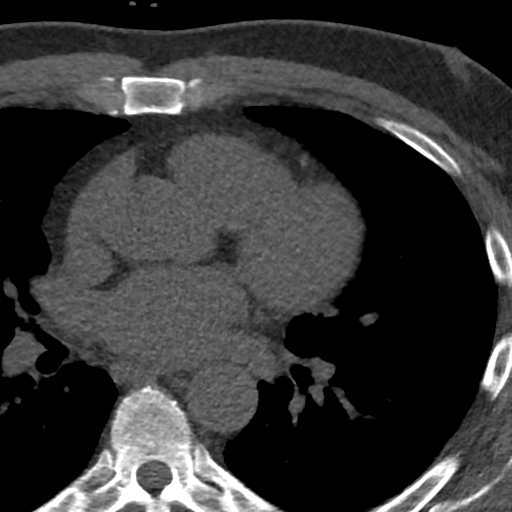
[im 32/36  vessel]
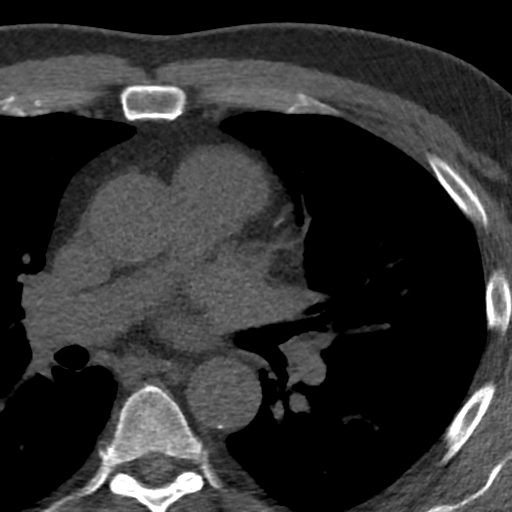

[Series 10: lung st 73 % · axial · 0.71mm/px · z∈[-196,-112]mm · 8 of 36 slices shown]
[im 4/36  lung]
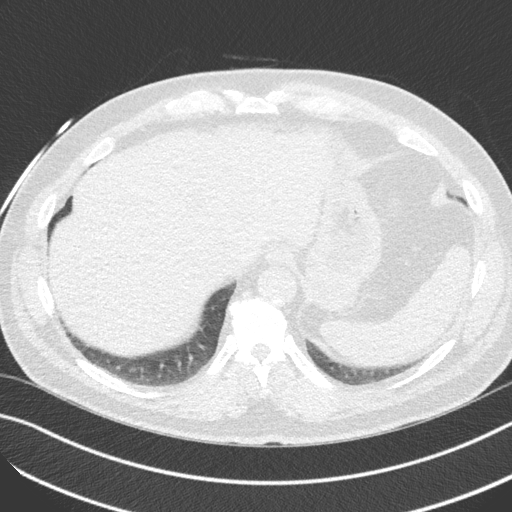
[im 8/36  lung]
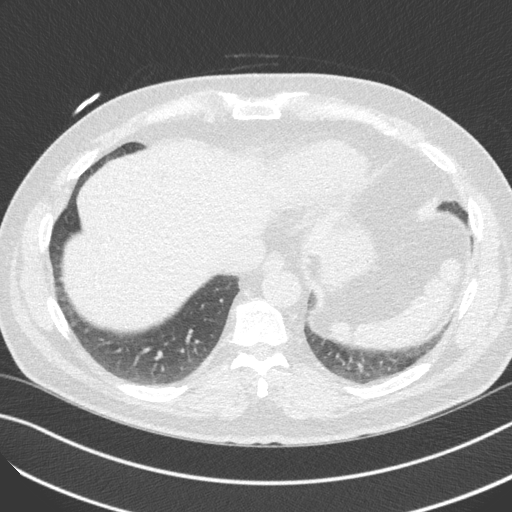
[im 12/36  lung]
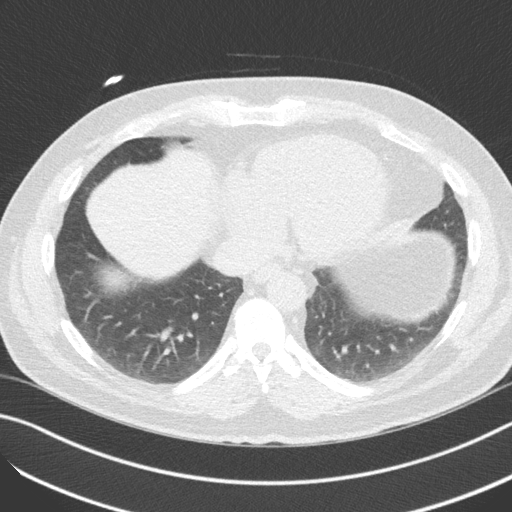
[im 16/36  lung]
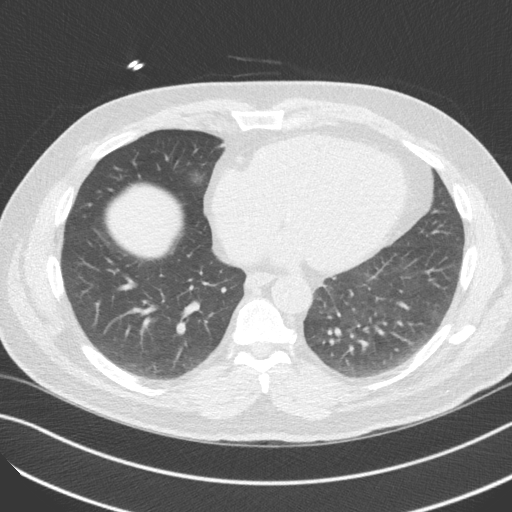
[im 20/36  lung]
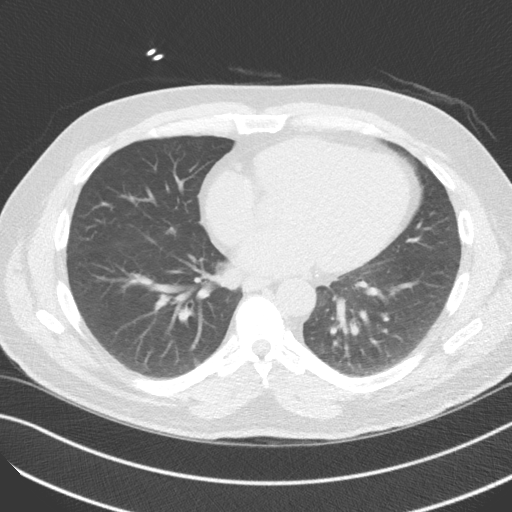
[im 24/36  lung]
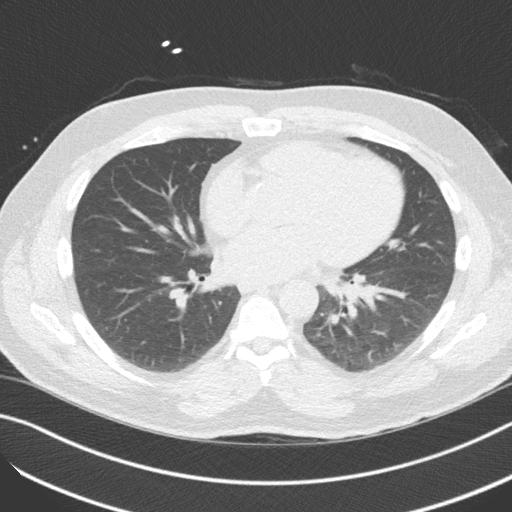
[im 28/36  lung]
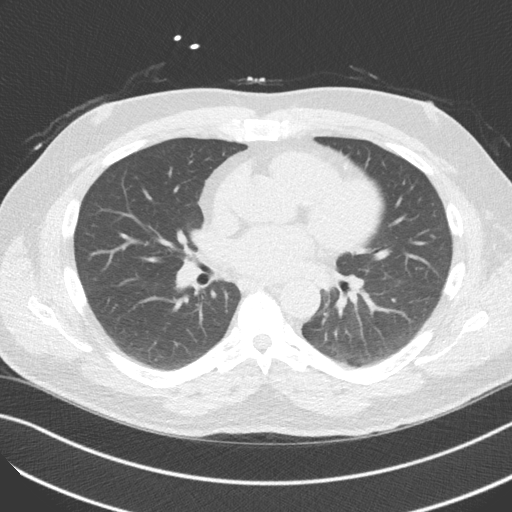
[im 32/36  lung]
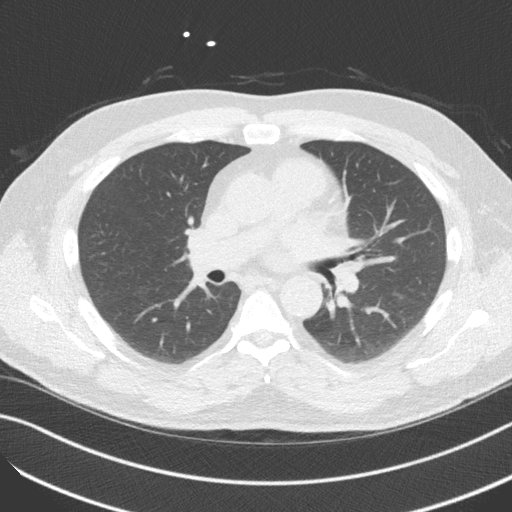

[16 of 20 positions shown; findings below may reference images not displayed]

FINDINGS: Vascular: Scattered aortic calcifications. No evidence of aortic
aneurysm. Heart is normal size.

Mediastinum/Nodes: No adenopathy in the lower mediastinum or hila.

Lungs/Pleura: Visualized lungs clear.  No effusions.

Upper Abdomen: Scattered low-density lesions in the visualized liver
are stable since prior abdominal CT and most compatible with cysts.
No acute findings.

Musculoskeletal: Chest wall soft tissues are unremarkable. No acute
bony abnormality.
IMPRESSION: No acute extra cardiac abnormality.

Scattered aortic atherosclerosis.

Stable hepatic cysts.
FINDINGS: Non-cardiac: See separate report from [REDACTED].

Ascending Aorta: Normal Caliber.  Scattered calcifications.

Pericardium: Normal

Coronary arteries: Normal coronary origins. Coronary calcifications
in the RCA and LAD.
IMPRESSION: Coronary calcium score of 23. This was 39th percentile for age and
sex matched control.

Nazareth Jumper

*** End of Addendum ***
EXAM:
OVER-READ INTERPRETATION  CT CHEST

The following report is an over-read performed by radiologist Dr.
Ginna Tiger [REDACTED] on 03/14/2019. This
over-read does not include interpretation of cardiac or coronary
anatomy or pathology. The coronary calcium score interpretation by
the cardiologist is attached.
FINDINGS: Vascular: Scattered aortic calcifications. No evidence of aortic
aneurysm. Heart is normal size.

Mediastinum/Nodes: No adenopathy in the lower mediastinum or hila.

Lungs/Pleura: Visualized lungs clear.  No effusions.

Upper Abdomen: Scattered low-density lesions in the visualized liver
are stable since prior abdominal CT and most compatible with cysts.
No acute findings.

Musculoskeletal: Chest wall soft tissues are unremarkable. No acute
bony abnormality.
IMPRESSION: No acute extra cardiac abnormality.

Scattered aortic atherosclerosis.

Stable hepatic cysts.

## 2021-07-19 DIAGNOSIS — R7301 Impaired fasting glucose: Secondary | ICD-10-CM | POA: Diagnosis not present

## 2021-07-19 DIAGNOSIS — E785 Hyperlipidemia, unspecified: Secondary | ICD-10-CM | POA: Diagnosis not present

## 2021-07-19 DIAGNOSIS — Z125 Encounter for screening for malignant neoplasm of prostate: Secondary | ICD-10-CM | POA: Diagnosis not present

## 2021-07-25 DIAGNOSIS — R030 Elevated blood-pressure reading, without diagnosis of hypertension: Secondary | ICD-10-CM | POA: Diagnosis not present

## 2021-07-25 DIAGNOSIS — Z0000 Encounter for general adult medical examination without abnormal findings: Secondary | ICD-10-CM | POA: Diagnosis not present

## 2021-07-25 DIAGNOSIS — Z1331 Encounter for screening for depression: Secondary | ICD-10-CM | POA: Diagnosis not present

## 2021-07-25 DIAGNOSIS — Z1339 Encounter for screening examination for other mental health and behavioral disorders: Secondary | ICD-10-CM | POA: Diagnosis not present

## 2021-10-12 DIAGNOSIS — L570 Actinic keratosis: Secondary | ICD-10-CM | POA: Diagnosis not present

## 2021-10-12 DIAGNOSIS — L814 Other melanin hyperpigmentation: Secondary | ICD-10-CM | POA: Diagnosis not present

## 2021-10-12 DIAGNOSIS — D1801 Hemangioma of skin and subcutaneous tissue: Secondary | ICD-10-CM | POA: Diagnosis not present

## 2021-10-12 DIAGNOSIS — L218 Other seborrheic dermatitis: Secondary | ICD-10-CM | POA: Diagnosis not present

## 2021-10-12 DIAGNOSIS — C44519 Basal cell carcinoma of skin of other part of trunk: Secondary | ICD-10-CM | POA: Diagnosis not present

## 2021-10-12 DIAGNOSIS — Z85828 Personal history of other malignant neoplasm of skin: Secondary | ICD-10-CM | POA: Diagnosis not present

## 2022-01-20 ENCOUNTER — Telehealth: Payer: Self-pay | Admitting: *Deleted

## 2022-01-20 NOTE — Chronic Care Management (AMB) (Signed)
  Care Coordination   Note   01/20/2022 Name: DETRIC SCALISI MRN: [id number] DOB: Jul 23, 1955  MADOC HOLQUIN is a 66 y.o. year old male who sees Brigitte Pulse, Emily Filbert., MD for primary care. I reached out to Clerance Lav by phone today to offer care coordination services.  Mr. Cassatt was given information about Care Coordination services today including:   The Care Coordination services include support from the care team which includes your Nurse Coordinator, Clinical Social Worker, or Pharmacist.  The Care Coordination team is here to help remove barriers to the health concerns and goals most important to you. Care Coordination services are voluntary, and the patient may decline or stop services at any time by request to their care team member.   Care Coordination Consent Status: Patient did not agree to participate in care coordination services at this time.    Encounter Outcome:  Pt. Refused  Magazine  Direct Dial: [phone]

## 2022-03-09 ENCOUNTER — Other Ambulatory Visit (HOSPITAL_COMMUNITY): Payer: Self-pay

## 2022-03-09 ENCOUNTER — Telehealth: Payer: Self-pay

## 2022-03-09 NOTE — Telephone Encounter (Signed)
Patient Advocate Encounter  Received notification from [place] that prior authorization is required for Humira Pen (CF) 40MG/0.4ML pen-injector kit. PA submitted and APPROVED on 03/09/2022.  Key [id number]  Effective: 02/07/2022 - 03/09/2023

## 2022-04-14 DIAGNOSIS — Z85828 Personal history of other malignant neoplasm of skin: Secondary | ICD-10-CM | POA: Diagnosis not present

## 2022-04-14 DIAGNOSIS — L0889 Other specified local infections of the skin and subcutaneous tissue: Secondary | ICD-10-CM | POA: Diagnosis not present

## 2022-04-14 DIAGNOSIS — L814 Other melanin hyperpigmentation: Secondary | ICD-10-CM | POA: Diagnosis not present

## 2022-04-14 DIAGNOSIS — C44712 Basal cell carcinoma of skin of right lower limb, including hip: Secondary | ICD-10-CM | POA: Diagnosis not present

## 2022-04-14 DIAGNOSIS — L821 Other seborrheic keratosis: Secondary | ICD-10-CM | POA: Diagnosis not present

## 2022-04-14 DIAGNOSIS — L218 Other seborrheic dermatitis: Secondary | ICD-10-CM | POA: Diagnosis not present

## 2022-04-14 DIAGNOSIS — L011 Impetiginization of other dermatoses: Secondary | ICD-10-CM | POA: Diagnosis not present

## 2022-04-14 DIAGNOSIS — L738 Other specified follicular disorders: Secondary | ICD-10-CM | POA: Diagnosis not present

## 2022-05-15 ENCOUNTER — Other Ambulatory Visit: Payer: Self-pay | Admitting: Internal Medicine

## 2022-07-18 NOTE — Progress Notes (Unsigned)
     07/18/2022 PHARRELL NIKAS HM:[id number] 1955/12/19   Chief Complaint:  History of Present Illness: Terry Kerr is a 67 year old male with a past medical history of history of ileocolonic Crohn's disease with ileal stricture status post segmental small bowel resection in May 2016. He was last seen in office by Dr. Hilarie Fredrickson 08/19/2020. At that time, his Crohn's disease was well controlled on Humira injections Q 2 weeks.   Colonoscopy 09/20/2020:  1. Surgical [P], right colon bx's - LYMPHOCYTIC COLITIS - SEE COMMENT 2. Surgical [P], left colon - BENIGN COLONIC MUCOSA - NO ACTIVE INFLAMMATION OR EVIDENCE OF MICROSCOPIC COLITIS - NO HIGH GRADE DYSPLASIA OR MALIGNANCY IDENTIFIED  3 year colonoscopy recall     Latest Ref Rng & Units 10/16/2018    9:27 AM 06/28/2018   11:40 AM 10/23/2017    9:17 AM  CBC  WBC 4.0 - 10.5 K/uL 8.0  7.5  5.1   Hemoglobin 13.0 - 17.0 g/dL 15.6  15.8  15.0   Hematocrit 39.0 - 52.0 % 44.9  46.5  42.6   Platelets 150.0 - 400.0 K/uL 218.0  198.0  209.0         Latest Ref Rng & Units 10/16/2018    9:27 AM 06/28/2018   11:40 AM 10/23/2017    9:17 AM  CMP  Glucose 70 - 99 mg/dL 97  92  92   BUN 6 - 23 mg/dL 15  18  20   $ Creatinine 0.40 - 1.50 mg/dL 0.91  0.95  0.99   Sodium 135 - 145 mEq/L 140  141  142   Potassium 3.5 - 5.1 mEq/L 4.3  4.4  4.5   Chloride 96 - 112 mEq/L 105  107  106   CO2 19 - 32 mEq/L 27  28  30   $ Calcium 8.4 - 10.5 mg/dL 9.0  9.3  9.6   Total Protein 6.0 - 8.3 g/dL 6.7  6.2  6.9   Total Bilirubin 0.2 - 1.2 mg/dL 0.7  0.9  0.8   Alkaline Phos 39 - 117 U/L 55  58  58   AST 0 - 37 U/L 15  16  16   $ ALT 0 - 53 U/L 23  34  33        Current Medications, Allergies, Past Medical History, Past Surgical History, Family History and Social History were reviewed in [employer] record.   Review of Systems:   Constitutional: Negative for fever, sweats, chills or weight loss.  Respiratory: Negative for shortness of  breath.   Cardiovascular: Negative for chest pain, palpitations and leg swelling.  Gastrointestinal: See HPI.  Musculoskeletal: Negative for back pain or muscle aches.  Neurological: Negative for dizziness, headaches or paresthesias.    Physical Exam: There were no vitals taken for this visit. General: in no acute distress. Head: Normocephalic and atraumatic. Eyes: No scleral icterus. Conjunctiva pink . Ears: Normal auditory acuity. Mouth: Dentition intact. No ulcers or lesions.  Lungs: Clear throughout to auscultation. Heart: Regular rate and rhythm, no murmur. Abdomen: Soft, nontender and nondistended. No masses or hepatomegaly. Normal bowel sounds x 4 quadrants.  Rectal: *** Musculoskeletal: Symmetrical with no gross deformities. Extremities: No edema. Neurological: Alert oriented x 4. No focal deficits.  Psychological: Alert and cooperative. Normal mood and affect  Assessment and Recommendations: ***

## 2022-07-19 ENCOUNTER — Other Ambulatory Visit (INDEPENDENT_AMBULATORY_CARE_PROVIDER_SITE_OTHER): Payer: Federal, State, Local not specified - PPO

## 2022-07-19 ENCOUNTER — Ambulatory Visit (INDEPENDENT_AMBULATORY_CARE_PROVIDER_SITE_OTHER): Payer: Federal, State, Local not specified - PPO | Admitting: Nurse Practitioner

## 2022-07-19 ENCOUNTER — Encounter: Payer: Self-pay | Admitting: Nurse Practitioner

## 2022-07-19 VITALS — BP 128/78 | HR 60 | Ht 68.0 in | Wt 184.0 lb

## 2022-07-19 DIAGNOSIS — K5000 Crohn's disease of small intestine without complications: Secondary | ICD-10-CM | POA: Diagnosis not present

## 2022-07-19 LAB — VITAMIN D 25 HYDROXY (VIT D DEFICIENCY, FRACTURES): VITD: 17.03 ng/mL — ABNORMAL LOW (ref 30.00–100.00)

## 2022-07-19 LAB — COMPREHENSIVE METABOLIC PANEL
ALT: 14 U/L (ref 0–53)
AST: 12 U/L (ref 0–37)
Albumin: 4.4 g/dL (ref 3.5–5.2)
Alkaline Phosphatase: 52 U/L (ref 39–117)
BUN: 19 mg/dL (ref 6–23)
CO2: 26 mEq/L (ref 19–32)
Calcium: 9.8 mg/dL (ref 8.4–10.5)
Chloride: 105 mEq/L (ref 96–112)
Creatinine, Ser: 1 mg/dL (ref 0.40–1.50)
GFR: 78.21 mL/min (ref >60.00)
Glucose, Bld: 97 mg/dL (ref 70–99)
Potassium: 4.8 mEq/L (ref 3.5–5.1)
Sodium: 142 mEq/L (ref 135–145)
Total Bilirubin: 0.6 mg/dL (ref 0.2–1.2)
Total Protein: 7.2 g/dL (ref 6.0–8.3)

## 2022-07-19 LAB — CBC WITH DIFFERENTIAL/PLATELET
Basophils Absolute: 0 10*3/uL (ref 0.0–0.1)
Basophils Relative: 0.4 % (ref 0.0–3.0)
Eosinophils Absolute: 0.1 10*3/uL (ref 0.0–0.7)
Eosinophils Relative: 1.3 % (ref 0.0–5.0)
HCT: 47.3 % (ref 39.0–52.0)
Hemoglobin: 16.3 g/dL (ref 13.0–17.0)
Lymphocytes Relative: 32.9 % (ref 12.0–46.0)
Lymphs Abs: 1.7 10*3/uL (ref 0.7–4.0)
MCHC: 34.4 g/dL (ref 30.0–36.0)
MCV: 91.3 fl (ref 78.0–100.0)
Monocytes Absolute: 0.4 10*3/uL (ref 0.1–1.0)
Monocytes Relative: 8.5 % (ref 3.0–12.0)
Neutro Abs: 3 10*3/uL (ref 1.4–7.7)
Neutrophils Relative %: 56.9 % (ref 43.0–77.0)
Platelets: 207 10*3/uL (ref 150.0–400.0)
RBC: 5.18 Mil/uL (ref 4.22–5.81)
RDW: 13.2 % (ref 11.5–15.5)
WBC: 5.3 10*3/uL (ref 4.0–10.5)

## 2022-07-19 LAB — C-REACTIVE PROTEIN: CRP: <1 mg/dL (ref 0.5–20.0)

## 2022-07-19 MED ORDER — HYOSCYAMINE SULFATE 0.125 MG SL SUBL
SUBLINGUAL_TABLET | SUBLINGUAL | 1 refills | Status: AC
Start: 2022-07-19 — End: ?

## 2022-07-19 NOTE — Patient Instructions (Signed)
Your provider has requested that you go to the basement level for lab work before leaving today. Press "B" on the elevator. The lab is located at the first door on the left as you exit the elevator.  You have been scheduled for an MRI at [facility] on 07/23/22. Your appointment time is 9 am. Please arrive to admitting (at main entrance of the hospital) 15 minutes prior to your appointment time for registration purposes. Please make certain not to have anything to eat or drink 4 hours prior to your test. In addition, if you have any metal in your body, have a pacemaker or defibrillator, please be sure to let your ordering physician know. This test typically takes 45 minutes to 1 hour to complete. Should you need to reschedule, please call [phone] to do so.   We have sent the following medications to your pharmacy for you to pick up at your convenience: Levsin  Due to recent changes in healthcare laws, you may see the results of your imaging and laboratory studies on MyChart before your provider has had a chance to review them.  We understand that in some cases there may be results that are confusing or concerning to you. Not all laboratory results come back in the same time frame and the provider may be waiting for multiple results in order to interpret others.  Please give [place] 48 hours in order for your provider to thoroughly review all the results before contacting the office for clarification of your results.    Thank you for trusting me with your gastrointestinal care!   Carl Best, CRNP

## 2022-07-20 NOTE — Progress Notes (Signed)
Addendum: Reviewed and agree with assessment and management plan. Teriana Danker M, MD  

## 2022-07-21 LAB — QUANTIFERON-TB GOLD PLUS
Mitogen-NIL: >10 IU/mL
NIL: 0.05 IU/mL
QuantiFERON-TB Gold Plus: NEGATIVE
TB1-NIL: 0 IU/mL
TB2-NIL: 0 IU/mL

## 2022-07-23 ENCOUNTER — Ambulatory Visit (HOSPITAL_COMMUNITY)
Admission: RE | Admit: 2022-07-23 | Discharge: 2022-07-23 | Disposition: A | Discharge status: Home / Self Care | Payer: Federal, State, Local not specified - PPO | Source: Ambulatory Visit | Attending: Nurse Practitioner | Admitting: Nurse Practitioner

## 2022-07-23 DIAGNOSIS — K5000 Crohn's disease of small intestine without complications: Secondary | ICD-10-CM | POA: Diagnosis present

## 2022-07-23 MED ORDER — GADOBUTROL 1 MMOL/ML IV SOLN
8.0000 mL | Freq: Once | INTRAVENOUS | Status: AC | PRN
  Administered 2022-07-23: 8 mL via INTRAVENOUS

## 2022-07-24 ENCOUNTER — Other Ambulatory Visit: Payer: Self-pay

## 2022-07-24 ENCOUNTER — Telehealth: Payer: Self-pay | Admitting: Nurse Practitioner

## 2022-07-24 DIAGNOSIS — E559 Vitamin D deficiency, unspecified: Secondary | ICD-10-CM

## 2022-07-24 DIAGNOSIS — R7989 Other specified abnormal findings of blood chemistry: Secondary | ICD-10-CM

## 2022-07-24 MED ORDER — VITAMIN D (ERGOCALCIFEROL) 1.25 MG (50000 UNIT) PO CAPS: 50000.0000 [IU] | ORAL_CAPSULE | ORAL | 0 refills | Status: DC

## 2022-07-24 NOTE — Telephone Encounter (Signed)
Spoke with pt:  Documented under result notes:  Pt verbalized understanding with all questions answered.   

## 2022-07-24 NOTE — Telephone Encounter (Signed)
Inbound call from patient states he is returning call. Please advise. 

## 2022-07-25 LAB — ADALIMUMAB+AB (SERIAL MONITOR)
Adalimumab Drug Level: 10 ug/mL
Anti-Adalimumab Antibody: <25 ng/mL

## 2022-07-26 ENCOUNTER — Encounter: Payer: Self-pay | Admitting: Cardiology

## 2022-10-23 ENCOUNTER — Other Ambulatory Visit: Payer: Self-pay | Admitting: Nurse Practitioner

## 2022-10-23 DIAGNOSIS — R7989 Other specified abnormal findings of blood chemistry: Secondary | ICD-10-CM

## 2022-10-27 NOTE — Telephone Encounter (Signed)
Reminder sent to patient's mychart.

## 2022-10-30 ENCOUNTER — Telehealth: Payer: Self-pay | Admitting: Nurse Practitioner

## 2022-10-30 NOTE — Telephone Encounter (Signed)
Spoke with patient & advised him that he would need to come in for labs at his earliest convenience to recheck VIT D level before refills. Pt verbalized all understanding.

## 2022-10-30 NOTE — Telephone Encounter (Signed)
Patient returning call. Please advise

## 2022-10-30 NOTE — Telephone Encounter (Signed)
Left message for patient to call back  

## 2022-10-30 NOTE — Telephone Encounter (Signed)
Spoke with patient & he's aware he will need to come in for labs prior to VIT D refill. Advised him on when/where to go for labs. Pt verbalized all understanding.

## 2022-10-31 ENCOUNTER — Other Ambulatory Visit (INDEPENDENT_AMBULATORY_CARE_PROVIDER_SITE_OTHER): Payer: Medicare Other

## 2022-10-31 DIAGNOSIS — R7989 Other specified abnormal findings of blood chemistry: Secondary | ICD-10-CM

## 2022-10-31 DIAGNOSIS — E559 Vitamin D deficiency, unspecified: Secondary | ICD-10-CM | POA: Diagnosis not present

## 2022-10-31 LAB — VITAMIN D 25 HYDROXY (VIT D DEFICIENCY, FRACTURES): VITD: 43 ng/mL (ref 30.00–100.00)

## 2023-02-13 ENCOUNTER — Other Ambulatory Visit (HOSPITAL_COMMUNITY): Payer: Self-pay

## 2023-02-13 ENCOUNTER — Telehealth: Payer: Self-pay | Admitting: Pharmacy Technician

## 2023-02-13 NOTE — Telephone Encounter (Signed)
Pharmacy Patient Advocate Encounter   Received notification from CoverMyMeds that prior authorization for HUMIRA 40MG  is required/requested.   Insurance verification completed.   The patient is insured through [employer] .   Per test claim: PA required; PA submitted to [facility] via Fax Key/confirmation #/EOC [id number] Status is pending

## 2023-03-14 ENCOUNTER — Telehealth: Payer: Self-pay | Admitting: Nurse Practitioner

## 2023-03-14 NOTE — Telephone Encounter (Signed)
Inbound call from patient, states Humira requires a Prior Authorization.

## 2023-03-14 NOTE — Telephone Encounter (Signed)
Please advise 

## 2023-03-22 ENCOUNTER — Telehealth: Payer: Self-pay | Admitting: Internal Medicine

## 2023-03-22 NOTE — Telephone Encounter (Signed)
Inbound call from patient stating that he needs a refill for Humira Pen. Patient is requesting a call back to discuss. Please advise.

## 2023-03-23 NOTE — Telephone Encounter (Signed)
Patient called again today stating he spoke with someone from [facility] regarding his preapproval for his Humira.  They told him that an email had been sent and that it had to be responded to and marked URGENT so that he can get his medicine.  Please respond.

## 2023-03-26 ENCOUNTER — Other Ambulatory Visit (HOSPITAL_COMMUNITY): Payer: Self-pay

## 2023-03-26 NOTE — Telephone Encounter (Addendum)
Pharmacy Patient Advocate Encounter  Received notification from [facility] that Prior Authorization for HUMIRA 40MG  has been APPROVED from 9.28.24 to 4.26.26. Ran test claim, Copay is $5. This test claim was processed through [facility]- copay amounts may vary at other pharmacies due to pharmacy/plan contracts, or as the patient moves through the different stages of their insurance plan.  KEY: [id number] PA #/Case ID/Reference #: [id number]

## 2023-03-26 NOTE — Telephone Encounter (Signed)
Please see note below regarding pts Humira PA.

## 2023-03-27 NOTE — Telephone Encounter (Signed)
Please see encounter from 9.17.24. PA has been approved

## 2023-03-27 NOTE — Telephone Encounter (Signed)
Pt aware.

## 2023-04-05 ENCOUNTER — Other Ambulatory Visit: Payer: Self-pay | Admitting: Internal Medicine

## 2023-06-11 ENCOUNTER — Encounter: Payer: Self-pay | Admitting: Family Medicine

## 2023-06-11 ENCOUNTER — Ambulatory Visit (INDEPENDENT_AMBULATORY_CARE_PROVIDER_SITE_OTHER): Payer: Medicare Other | Admitting: Family Medicine

## 2023-06-11 VITALS — BP 126/72 | Ht 68.0 in | Wt 190.0 lb

## 2023-06-11 DIAGNOSIS — M25552 Pain in left hip: Secondary | ICD-10-CM | POA: Diagnosis not present

## 2023-06-11 DIAGNOSIS — S46812A Strain of other muscles, fascia and tendons at shoulder and upper arm level, left arm, initial encounter: Secondary | ICD-10-CM | POA: Diagnosis not present

## 2023-06-11 DIAGNOSIS — M25551 Pain in right hip: Secondary | ICD-10-CM | POA: Diagnosis present

## 2023-06-11 NOTE — Progress Notes (Signed)
 PCP: Loreli Elsie JONETTA Mickey., MD  Subjective:   HPI: Patient is a 68 y.o. male here for left neck/shoulder pain and bilateral hip pain.  Left neck/shoulder pain Patient had neck and shoulder pain with triceps weakness 30 years ago before having a cervical laminectomy. After the surgery he had lot of relief and weakness improved. Since then patient has had no issues until the last couple months. He has been playing pickle ball with his wife and has noticed pain in his left neck and upper shoulder after playing a couple rounds. Denies weakness or numbness/tingling. Says that after he takes some ibuprofen he feels better. Does not have this pain with any other activities such as driving, or at rest. He hits with his right arm. No pain radiating down his arm.   Bilateral hip pain Patient notes lateral hip pain on both sides after playing pickleball. Says that he does not have weakness or pain radiating down his legs, but feels like he walks like an old man with stiffness after playing. Does not have pain at other times. Does not stretch with exercise. He notes he is very inflexible and has tried to do stretches with his wife but does not like to. No numbness or tingling. No back pain. No knee pain.     Past Medical History:  Diagnosis Date   Allergic rhinitis    pollen, animal hair, dust mites   Allergy    Crohn disease (HCC)    Diverticulosis    Hemorrhoids, internal, thrombosed s/p resection/pexy 12/06/2012 12/04/2012   HLD (hyperlipidemia)    Obesity    Regional enteritis (Crohn's disease) (HCC) 08/01/2007   Qualifier: Diagnosis of  By: Bartley MD, Lamar Mulch    Small bowel obstruction [facility]     Current Outpatient Medications on File Prior to Visit  Medication Sig Dispense Refill   atorvastatin  (LIPITOR) 40 MG tablet Take 40 mg by mouth daily.     Cholecalciferol (VITAMIN D ) 2000 UNITS tablet Take 2,000 Units by mouth daily.     fexofenadine (ALLEGRA) 30 MG tablet Take 30 mg by mouth daily  as needed (for allergies).      HUMIRA , 2 PEN, 40 MG/0.4ML pen INJECT 1 PEN UNDER THE SKIN EVERY 14 DAYS. 2 each 3   hyoscyamine  (LEVSIN  SL) 0.125 MG SL tablet Take 1-2 tablet every 4-6 hours as needed for abdominal pain/spasm 120 tablet 1   Vitamin D , Ergocalciferol , (DRISDOL ) 1.25 MG (50000 UNIT) CAPS capsule Take 1 capsule (50,000 Units total) by mouth every 7 (seven) days. 12 capsule 0   No current facility-administered medications on file prior to visit.    Past Surgical History:  Procedure Laterality Date   APPENDECTOMY     COLON RESECTION N/A 09/30/2014   Procedure: DIAGNOSTIC LAPAROSCOPY WITH EXPLORATORY LAP WITH SMALL BOWEL RESECTION AND ANASTAMOSIS;  Surgeon: Lynda Leos, MD;  Location: [facility];  Service: General;  Laterality: N/A;   COLONOSCOPY     HEMORROIDECTOMY     LAMINECTOMY     RETINAL TEAR REPAIR CRYOTHERAPY Right 06/09/14   repaired with laser   TONSILLECTOMY      No Known Allergies  BP 126/72   Ht 5' 8 (1.727 m)   Wt 190 lb (86.2 kg)   BMI 28.89 kg/m      01/26/2020    8:47 AM  [facility]  Frequency of aerobic exercise (# of days/week) 3  Average time in minutes 20  Frequency of strengthening activities (# of  days/week) 0        No data to display              Objective:  Physical Exam:  Gen: NAD, comfortable in exam room Cervical spine:  - Inspection: no gross deformity. or ecchymosis. No skin changes.  - Palpation: No TTP over the spinous processes, paraspinal muscles, trapezius feels tight and slightly hypertrophied on left side     - ROM: full active ROM of the cervical spine in flex/ext/SB/rotation without pain    - Strength: 5/5 strength of upper extremity in C5-T1 nerve root distributions b/l   - Neuro: sensation intact in the C5-T1 nerve root distribution b/l Shoulder, L: No TTP . No skin changes, erythema, or ecchymosis noted. No evidence of bony deformity, asymmetry, or muscle atrophy; No tenderness over  long head of biceps (bicipital groove). No TTP at [facility] joint. Full active and passive range of motion (180 flex Camelia /150Abd /90ER /70IR), Strength 5/5 throughout. Sensation to light touch intact. Peripheral pulses intact.   Special Tests:   - Painful Arc absent    - Empty can: NEG   - Int/Ext Rotation test: NEG   [place] [employer] Lift-Off Test: NEG   - Hawkins: NEG   - Neer test: NEG   - O'brien's test: NEG   - Yergason's: NEG    Hip, Bilateral: No TTP noted . No obvious rash, erythema, ecchymosis, or edema. Passive Log Roll equivalent b/l without restriction. ROM full in all directions though with some tightness; Strength 5/5 in IR/ER/Flex/Ext/Abd/Add. Pelvic alignment unremarkable to inspection and palpation. Rope like IT bands bilaterally. Non-antalgic gait without trendelenburg / unsteadiness. Greater trochanter without tenderness to palpation. No tenderness over piriformis. No SI joint tenderness though has SI joint inflexibility .  Provocative Testing:    - FABER/FADIR test: Deri with limited external mobility though no weakness bl, fadir NEG   - Ober's test: positive bilaterally     - Thomas test: Pos bilaterally    - Trendelenburg test: NEG bilaterally    - log roll: neg bilaterally    Assessment & Plan:  1. Bilateral hip pain - Most likely due to inflexibility especially at the SI joint and tightness of the IT bands bilaterally. This is caused by general inflexibility. Patient has good gluteal and abductor strength and no evidence of trochanteric bursitis. Internal rotation is good so arthritis less likely.  - SI joint and IT  band exercises reviewed, if home exercises do not help can recommend physical therapy  - NSAIDs as needed  - return to f/u in 6 weeks   2. L neck pain s/t trapezius tightness - Patient has tight trapezius muscle L > R mos tlikely due to biomechanics during swing in pickleball and general posture. No evidence of radiculopathy or weakness.  - Trapezius  stretches reviewed  - NSAIDs as needed  - Return to f/u in 6 weeks   Areta [facility] PGY-2  06/11/23

## 2023-06-11 NOTE — Patient Instructions (Signed)
 You have IT band syndrome and SI joint dysfunction. Do home exercises and stretches daily as directed. Heat 15 minutes at a time as needed. Ibuprofen as needed for pain and inflammation. Ok to continue with activities, pickleball as tolerated. Call me if not improving and would put in a referral for physical therapy. You also have trapezius spasms/strain - do home exercises/stretches for this as well. Follow up with me in 6 weeks.

## 2023-06-21 ENCOUNTER — Other Ambulatory Visit: Payer: Self-pay

## 2023-06-21 DIAGNOSIS — M533 Sacrococcygeal disorders, not elsewhere classified: Secondary | ICD-10-CM

## 2023-06-21 DIAGNOSIS — M25551 Pain in right hip: Secondary | ICD-10-CM

## 2023-06-21 NOTE — Progress Notes (Signed)
Pt called asking for referral to PT. Referral sent.

## 2023-06-26 NOTE — Therapy (Signed)
OUTPATIENT PHYSICAL THERAPY LOWER EXTREMITY EVALUATION   Patient Name: Terry Kerr MRN: [id number] DOB:1956-04-11, 68 y.o., male Today's Date: 06/27/2023  END OF SESSION:  PT End of Session - 06/27/23 0933     Visit Number 1    Date for PT Re-Evaluation 08/08/23    Authorization Type MCR    Progress Note Due on Visit 10    PT Start Time 0933    PT Stop Time 1026    PT Time Calculation (min) 53 min    Activity Tolerance Patient tolerated treatment well    Behavior During Therapy [facility] for tasks assessed/performed             Past Medical History:  Diagnosis Date   Allergic rhinitis    pollen, animal hair, dust mites   Allergy    Crohn disease (HCC)    Diverticulosis    Hemorrhoids, internal, thrombosed s/p resection/pexy 12/06/2012 12/04/2012   HLD (hyperlipidemia)    Obesity    Regional enteritis (Crohn's disease) (HCC) 08/01/2007   Qualifier: Diagnosis of  By: Hetty Ely MD, Franne Grip    Small bowel obstruction [facility])    Past Surgical History:  Procedure Laterality Date   APPENDECTOMY     COLON RESECTION N/A 09/30/2014   Procedure: DIAGNOSTIC LAPAROSCOPY WITH EXPLORATORY LAP WITH SMALL BOWEL RESECTION AND ANASTAMOSIS;  Surgeon: Axel Filler, MD;  Location: [facility];  Service: General;  Laterality: N/A;   COLONOSCOPY     HEMORROIDECTOMY     LAMINECTOMY     RETINAL TEAR REPAIR CRYOTHERAPY Right 06/09/14   repaired with laser   TONSILLECTOMY     Patient Active Problem List   Diagnosis Date Noted   Dyslipidemia 03/03/2019   Family history of early CAD 03/03/2019   Abdominal pain    S/P small bowel resection    SBO (small bowel obstruction) (HCC) 09/29/2014   Diverticulosis 12/04/2012   Hemorrhoids, internal, thrombosed s/p resection/pexy 12/06/2012 12/04/2012   HERPES SIMPLEX INFECTION 08/01/2007   HYPERCHOLESTEROLEMIA 08/01/2007   Regional enteritis (Crohn's disease) (HCC) 08/01/2007    PCP: Cleatis Polka., MD   REFERRING PROVIDER: Lenda Kelp,  MD   REFERRING DIAG:  [phone] (ICD-10-CM) - Bilateral hip pain  M53.3 (ICD-10-CM) - SI (sacroiliac) joint dysfunction  Evaluate and treat for SI joint dysfunction and B/L IT band syndrome.   THERAPY DIAG:  Stiffness of right hip, not elsewhere classified  Stiffness of left hip, not elsewhere classified  Cramp and spasm  Pain of both hip joints  Rationale for Evaluation and Treatment: Rehabilitation  ONSET DATE: 1 year  SUBJECTIVE:   SUBJECTIVE STATEMENT:  Patient reports increased activity with pickleball upon retirement has caused his hips to tighen up. Putting on socks is more difficult R>L.   PERTINENT HISTORY: R sciatica PAIN:  Are you having pain? Yes: NPRS scale: 3-4/10 Pain location: at greater trochanter Pain description: ache Aggravating factors: activity Relieving factors: stretches some  PRECAUTIONS: None  RED FLAGS: None   WEIGHT BEARING RESTRICTIONS: No  FALLS:  Has patient fallen in last 6 months? No  LIVING ENVIRONMENT: Lives with: lives with their spouse Lives in: House/apartment  OCCUPATION: retired  PLOF: Independent and Leisure: golf, Insurance underwriter, works out at [employer]  PATIENT GOALS: live a more comfortable life, get socks on easier  NEXT MD VISIT: none scheduled  OBJECTIVE:  Note: Objective measures were completed at Evaluation unless otherwise noted.  DIAGNOSTIC FINDINGS: none  PATIENT SURVEYS:  LEFS 78 / 80 = 97.5 %  COGNITION: Overall cognitive status: Within functional limits for tasks assessed     SENSATION: WFL   MUSCLE LENGTH: Tightness in  B HS, quads, hip flexors, piriformis,  R>L  POSTURE: No Significant postural limitations  PALPATION: Palpation: TTP at R QL. Increased tissue tension in R QL Spinal Mobility: decreased R lumbar  LUMBAR ROM: WFL but tight in B SB, rotation and flexion limited by HS tightness. Quick pinch of pain with R SB and uncomfortable with R quadrant testing.  LOWER  EXTREMITY ROM:  Active ROM Right eval Left eval  Hip flexion    Hip extension    Hip abduction    Hip adduction    Hip internal rotation 25 30  Hip external rotation 29 22  Knee flexion    Knee extension    Ankle dorsiflexion    Ankle plantarflexion    Ankle inversion    Ankle eversion     (Blank rows = not tested)  LOWER EXTREMITY MMT:  MMT Right eval Left eval  Hip flexion 4 4+  Hip extension 5 5  Hip abduction 5 5  Hip adduction    Hip internal rotation    Hip external rotation    Knee flexion 5 5  Knee extension 5 5  Ankle dorsiflexion    Ankle plantarflexion    Ankle inversion    Ankle eversion     (Blank rows = not tested)  LOWER EXTREMITY SPECIAL TESTS:  Hip special tests: Luisa Hart (FABER) test: negative and Hip scouring test: negative                                                                                                                                TREATMENT DATE:   06/27/23 See pt ed and HEP   PATIENT EDUCATION:  Education details: PT eval findings, anticipated POC, initial HEP, and HEP review  Person educated: Patient Education method: Explanation, Demonstration, Verbal cues, and Handouts Education comprehension: verbalized understanding and returned demonstration  HOME EXERCISE PROGRAM: Access Code: [email or web address] URL: [email or web address] Date: 06/27/2023 Prepared by: Raynelle Fanning  Exercises - Seated Hip Internal Rotation Active ROM  - 1 x daily - 7 x weekly - 2-3 sets - 10 reps - Left hip internal rotation stretch  - 1 x daily - 7 x weekly - 1 sets - 2 reps - 30 sec hold - Seated Hamstring Stretch  - 2 x daily - 7 x weekly - 1 sets - 3 reps - 30-60 sec hold - Dynamic HS Stretch  - 1 x daily - 3 x weekly - 2 sets - 10 reps - Seated Table Piriformis Stretch  - 2 x daily - 7 x weekly - 1 sets - 3 reps - 30-60 sec hold  - doing seated fig 4 and standing ITB at wall from MD.  ASSESSMENT:  CLINICAL IMPRESSION: Patient is a 68  y.o. male who was seen today for physical therapy evaluation and treatment for  B hip pain and ITB syndrome which has been occurring for about one year and affecting ADLs including donning socks and shoes. Pain is primarily post exercise or pickleball. He has marked flexibility deficits in BLE, R hip flexor weakness and also tightness in his R QL. He will benefit from skilled PT to address these deficits.     OBJECTIVE IMPAIRMENTS: decreased ROM, decreased strength, increased muscle spasms, impaired flexibility, and pain.   ACTIVITY LIMITATIONS: dressing  PARTICIPATION LIMITATIONS:  N/A  PERSONAL FACTORS: Age and Time since onset of injury/illness/exacerbation are also affecting patient's functional outcome.   REHAB POTENTIAL: Excellent  CLINICAL DECISION MAKING: Stable/uncomplicated  EVALUATION COMPLEXITY: Low   GOALS: Goals reviewed with patient? Yes  SHORT TERM GOALS: Target date: 07/18/2023   Patient will be independent with initial HEP. Baseline:  Goal status: INITIAL  2.  Decreased pain post exercise by 25% Baseline:  Goal status: INITIAL    LONG TERM GOALS: Target date: 08/08/2023   Patient will be independent with advanced/ongoing HEP to improve outcomes and carryover.  Baseline:  Goal status: INITIAL  2.  Patient will report at least >75% improvement in B hip pain post exercise to improve QOL. Baseline:  Goal status: INITIAL  3.  Patient will demonstrate improved R hip flexor strength to 5/5. Baseline:  Goal status: INITIAL  4.  Patient to demonstrate improved hip flexibility by reporting improved ease of donning/doffing shoes and socks by 50% Baseline:  Goal status: INITIAL    PLAN:  PT FREQUENCY: 1x/week  PT DURATION: 6 weeks  PLANNED INTERVENTIONS: 97164- PT Re-evaluation, 97110-Therapeutic exercises, 97530- Therapeutic activity, 97112- Neuromuscular re-education, 97535- Self Care, [id number]- Manual therapy, 97033- Ionotophoresis 4mg /ml Dexamethasone,  Dry Needling, Joint mobilization, Spinal mobilization, Cryotherapy, and Moist heat  PLAN FOR NEXT SESSION: Review and progress HEP for flexibility, hip rotator ROM, strengthening, possible DN to R QL, hip flexor strengthening 90/90 position    [employer], PT  06/27/2023, 10:41 AM

## 2023-06-27 ENCOUNTER — Encounter: Payer: Self-pay | Admitting: Physical Therapy

## 2023-06-27 ENCOUNTER — Ambulatory Visit: Payer: Medicare Other | Attending: Family Medicine | Admitting: Physical Therapy

## 2023-06-27 ENCOUNTER — Other Ambulatory Visit: Payer: Self-pay

## 2023-06-27 DIAGNOSIS — M25652 Stiffness of left hip, not elsewhere classified: Secondary | ICD-10-CM

## 2023-06-27 DIAGNOSIS — M25551 Pain in right hip: Secondary | ICD-10-CM

## 2023-06-27 DIAGNOSIS — M533 Sacrococcygeal disorders, not elsewhere classified: Secondary | ICD-10-CM | POA: Diagnosis not present

## 2023-06-27 DIAGNOSIS — M25552 Pain in left hip: Secondary | ICD-10-CM | POA: Insufficient documentation

## 2023-06-27 DIAGNOSIS — M25651 Stiffness of right hip, not elsewhere classified: Secondary | ICD-10-CM | POA: Diagnosis present

## 2023-06-27 DIAGNOSIS — R252 Cramp and spasm: Secondary | ICD-10-CM | POA: Diagnosis present

## 2023-06-29 ENCOUNTER — Other Ambulatory Visit: Payer: Self-pay

## 2023-06-29 DIAGNOSIS — S46812A Strain of other muscles, fascia and tendons at shoulder and upper arm level, left arm, initial encounter: Secondary | ICD-10-CM

## 2023-06-29 NOTE — Progress Notes (Signed)
Pt called asking for left shoulder and neck PT order be placed, he is already being seen for hip issues.

## 2023-07-02 ENCOUNTER — Encounter: Payer: Self-pay | Admitting: Physical Therapy

## 2023-07-02 ENCOUNTER — Ambulatory Visit: Payer: Medicare Other | Attending: Family Medicine | Admitting: Physical Therapy

## 2023-07-02 DIAGNOSIS — R293 Abnormal posture: Secondary | ICD-10-CM | POA: Diagnosis present

## 2023-07-02 DIAGNOSIS — M25552 Pain in left hip: Secondary | ICD-10-CM | POA: Insufficient documentation

## 2023-07-02 DIAGNOSIS — M25551 Pain in right hip: Secondary | ICD-10-CM | POA: Diagnosis present

## 2023-07-02 DIAGNOSIS — M25651 Stiffness of right hip, not elsewhere classified: Secondary | ICD-10-CM | POA: Diagnosis present

## 2023-07-02 DIAGNOSIS — R262 Difficulty in walking, not elsewhere classified: Secondary | ICD-10-CM | POA: Insufficient documentation

## 2023-07-02 DIAGNOSIS — M6281 Muscle weakness (generalized): Secondary | ICD-10-CM | POA: Diagnosis present

## 2023-07-02 DIAGNOSIS — S46812A Strain of other muscles, fascia and tendons at shoulder and upper arm level, left arm, initial encounter: Secondary | ICD-10-CM | POA: Diagnosis present

## 2023-07-02 DIAGNOSIS — R252 Cramp and spasm: Secondary | ICD-10-CM | POA: Insufficient documentation

## 2023-07-02 DIAGNOSIS — M25652 Stiffness of left hip, not elsewhere classified: Secondary | ICD-10-CM | POA: Diagnosis present

## 2023-07-02 DIAGNOSIS — M542 Cervicalgia: Secondary | ICD-10-CM | POA: Insufficient documentation

## 2023-07-02 NOTE — Therapy (Signed)
OUTPATIENT PHYSICAL THERAPY LOWER EXTREMITY TREATMENT / CERVICAL EVALUATION   Patient Name: Terry Kerr MRN: [id number] DOB:1955/07/01, 68 y.o., male Today's Date: 07/02/2023  END OF SESSION:  PT End of Session - 07/02/23 1717     Visit Number 2    Date for PT Re-Evaluation 08/08/23    Authorization Type MCR    Progress Note Due on Visit 10    PT Start Time 1531    PT Stop Time 1618    PT Time Calculation (min) 47 min    Activity Tolerance Patient tolerated treatment well    Behavior During Therapy [facility] for tasks assessed/performed              Past Medical History:  Diagnosis Date   Allergic rhinitis    pollen, animal hair, dust mites   Allergy    Crohn disease (HCC)    Diverticulosis    Hemorrhoids, internal, thrombosed s/p resection/pexy 12/06/2012 12/04/2012   HLD (hyperlipidemia)    Obesity    Regional enteritis (Crohn's disease) (HCC) 08/01/2007   Qualifier: Diagnosis of  By: Hetty Ely MD, Franne Grip    Small bowel obstruction [facility])    Past Surgical History:  Procedure Laterality Date   APPENDECTOMY     COLON RESECTION N/A 09/30/2014   Procedure: DIAGNOSTIC LAPAROSCOPY WITH EXPLORATORY LAP WITH SMALL BOWEL RESECTION AND ANASTAMOSIS;  Surgeon: Axel Filler, MD;  Location: [facility];  Service: General;  Laterality: N/A;   COLONOSCOPY     HEMORROIDECTOMY     LAMINECTOMY     RETINAL TEAR REPAIR CRYOTHERAPY Right 06/09/14   repaired with laser   TONSILLECTOMY     Patient Active Problem List   Diagnosis Date Noted   Dyslipidemia 03/03/2019   Family history of early CAD 03/03/2019   Abdominal pain    S/P small bowel resection    SBO (small bowel obstruction) (HCC) 09/29/2014   Diverticulosis 12/04/2012   Hemorrhoids, internal, thrombosed s/p resection/pexy 12/06/2012 12/04/2012   HERPES SIMPLEX INFECTION 08/01/2007   HYPERCHOLESTEROLEMIA 08/01/2007   Regional enteritis (Crohn's disease) (HCC) 08/01/2007    PCP: Cleatis Polka., MD   REFERRING  PROVIDER: Lenda Kelp, MD   REFERRING DIAG:  [phone] (ICD-10-CM) - Bilateral hip pain  M53.3 (ICD-10-CM) - SI (sacroiliac) joint dysfunction  Evaluate and treat for SI joint dysfunction and B/L IT band syndrome.   U13.244W (ICD-10-CM) - Strain of left trapezius muscle, initial encounter  THERAPY DIAG:  Stiffness of right hip, not elsewhere classified  Stiffness of left hip, not elsewhere classified  Cramp and spasm  Pain of both hip joints  Cervicalgia  Abnormal posture  Rationale for Evaluation and Treatment: Rehabilitation  ONSET DATE: 1 year  SUBJECTIVE:   SUBJECTIVE STATEMENT: Patient had a cervical laminectomy 30 years ago on his Lt side. The surgery completely eliminated this radicular symptoms.  Since he started playing pickle ball more frequently with pivoting and quick movements he noticed increase neck pain after playing pickle ball. He did find relief with Advil but now the symptoms are staying longer. He describes the neck pain as dull and achy.  From Eval:  Patient reports increased activity with pickleball upon retirement has caused his hips to tighen up. Putting on socks is more difficult R>L.   PERTINENT HISTORY: R sciatica; Lt sided cervical laminectomy 30 years ago PAIN:  Are you having pain? Yes: NPRS scale: 3-4/10 Pain location: at greater trochanter Pain description: ache Aggravating factors: activity Relieving factors: stretches some  PRECAUTIONS: None  RED FLAGS: None   WEIGHT BEARING RESTRICTIONS: No  FALLS:  Has patient fallen in last 6 months? No  LIVING ENVIRONMENT: Lives with: lives with their spouse Lives in: House/apartment  OCCUPATION: retired  PLOF: Independent and Leisure: golf, Insurance underwriter, works out at [employer]  PATIENT GOALS: live a more comfortable life, get socks on easier  NEXT MD VISIT: none scheduled  OBJECTIVE:  Note: Objective measures were completed at Evaluation unless otherwise  noted.  DIAGNOSTIC FINDINGS: none  PATIENT SURVEYS:  LEFS 78 / 80 = 97.5 %  COGNITION: Overall cognitive status: Within functional limits for tasks assessed     SENSATION: WFL   MUSCLE LENGTH: Tightness in  B HS, quads, hip flexors, piriformis,  R>L  POSTURE: rounded shoulders and forward head  PALPATION: Palpation: TTP at R QL. Increased tissue tension in R QL Spinal Mobility: decreased R lumbar 07/02/2023 increased muscle spasms of bilateral upper trap  LUMBAR ROM: WFL but tight in B SB, rotation and flexion limited by HS tightness. Quick pinch of pain with R SB and uncomfortable with R quadrant testing.  CERVICAL ROM:  Active ROM 07/02/2023   Flexion 45  Extension 30  Right Lateral Flexion 25  Left Lateral Flexion 30  Right Rotation 65  Left Rotation 70     LOWER EXTREMITY ROM:  Active ROM Right eval Left eval  Hip flexion    Hip extension    Hip abduction    Hip adduction    Hip internal rotation 25 30  Hip external rotation 29 22  Knee flexion    Knee extension    Ankle dorsiflexion    Ankle plantarflexion    Ankle inversion    Ankle eversion     (Blank rows = not tested)  LOWER EXTREMITY MMT:  MMT Right eval Left eval  Hip flexion 4 4+  Hip extension 5 5  Hip abduction 5 5  Hip adduction    Hip internal rotation    Hip external rotation    Knee flexion 5 5  Knee extension 5 5  Ankle dorsiflexion    Ankle plantarflexion    Ankle inversion    Ankle eversion     (Blank rows = not tested)  LOWER EXTREMITY SPECIAL TESTS:  Hip special tests: Luisa Hart (FABER) test: negative and Hip scouring test: negative                                                                                                                                TREATMENT DATE:   07/02/2023 NuStep Level 5 5 mins PT present to discuss status Cervical ROM measurements, palpation & discussion of neck pain Seated upper trap stretch x 30 sec bilateral  Seated levator trap  stretch x 30 sec bilateral  Seated hip internal rotation x 10 Seated hamstring stretch x 30 sec bilateral  Supine hip internal/ external rotation x 5 each direction Dynamic hamstring stretch x 10 bilateral  Standing shoulder rows & extension with green TB 2 x 10 Standing shoulder ER with green TB 2 x 10 Standing shoulder abduction with green TB 2 x 10  06/27/23 See pt ed and HEP   PATIENT EDUCATION:  Education details: PT eval findings, anticipated POC, initial HEP, and HEP review  Person educated: Patient Education method: Explanation, Demonstration, Verbal cues, and Handouts Education comprehension: verbalized understanding and returned demonstration  HOME EXERCISE PROGRAM: Access Code: [email or web address] URL: [email or web address] Date: 07/02/2023 Prepared by: Claude Manges  Exercises - Seated Hip Internal Rotation Active ROM  - 1 x daily - 7 x weekly - 2-3 sets - 10 reps - Left hip internal rotation stretch  - 1 x daily - 7 x weekly - 1 sets - 2 reps - 30 sec hold - Seated Hamstring Stretch  - 2 x daily - 7 x weekly - 1 sets - 3 reps - 30-60 sec hold - Dynamic HS Stretch  - 1 x daily - 3 x weekly - 2 sets - 10 reps - Seated Upper Trapezius Stretch  - 1 x daily - 7 x weekly - 2 sets - 30 hold - Seated Levator Scapulae Stretch  - 1 x daily - 7 x weekly - 2 sets - 30 hold - Seated Piriformis Stretch with Trunk Bend  - 1 x daily - 7 x weekly - 2 sets - 20 hold - Standing Shoulder Row with Anchored Resistance  - 1 x daily - 7 x weekly - 2 sets - 10 reps - Standing Shoulder Horizontal Abduction with Resistance  - 1 x daily - 7 x weekly - 2 sets - 10 reps - Shoulder External Rotation and Scapular Retraction with Resistance  - 1 x daily - 7 x weekly - 2 sets - 10 reps  - doing seated fig 4 and standing ITB at wall from MD.  ASSESSMENT:  CLINICAL IMPRESSION: Rocky Link presents to therapy after playing pickle ball for 1.5-2 hours. He had increased hip stiffness after playing pickle ball  today. Reviewed patient's HEP and he required minimal verbal cues for form correction. Patient also has increased neck pain after playing pickle ball. He had a cervical laminectomy 30 years ago and he noticed increased pain recently. Educated patient on how postural weakness can exacerbate neck pain. Updated patient's HEP to include postural strengthening exercises. Noted some limitations in neck ROM but is to be expected with patient's surgical history. Patient required verbal and visual cues for correct exercise performance. Patient will benefit from skilled PT to address the below impairments and improve overall function.   OBJECTIVE IMPAIRMENTS: decreased ROM, decreased strength, increased muscle spasms, impaired flexibility, and pain.   ACTIVITY LIMITATIONS: dressing  PARTICIPATION LIMITATIONS:  N/A  PERSONAL FACTORS: Age and Time since onset of injury/illness/exacerbation are also affecting patient's functional outcome.   REHAB POTENTIAL: Excellent  CLINICAL DECISION MAKING: Stable/uncomplicated  EVALUATION COMPLEXITY: Low   GOALS: Goals reviewed with patient? Yes  SHORT TERM GOALS: Target date: 07/18/2023   Patient will be independent with initial HEP. Baseline:  Goal status: INITIAL  2.  Decreased pain post exercise by 25% Baseline:  Goal status: INITIAL    LONG TERM GOALS: Target date: 08/08/2023   Patient will be independent with advanced/ongoing HEP to improve outcomes and carryover.  Baseline:  Goal status: INITIAL  2.  Patient will report at least >75% improvement in B hip pain post exercise to improve QOL. Baseline:  Goal status: INITIAL  3.  Patient will demonstrate improved  R hip flexor strength to 5/5. Baseline:  Goal status: INITIAL  4.  Patient to demonstrate improved hip flexibility by reporting improved ease of donning/doffing shoes and socks by 50% Baseline:  Goal status: INITIAL  5.  Patient will report at least >50% improvement in cervical pain  post exercise to improve QOL. Baseline:  Goal status: NEW  6.Patient will Verbalize and demonstrate self-care strategies to manage pain including tissue mobility practices and change of position Baseline:  Goal status: NEW    PLAN:  PT FREQUENCY: 1x/week  PT DURATION: 6 weeks  PLANNED INTERVENTIONS: 97164- PT Re-evaluation, 97110-Therapeutic exercises, 97530- Therapeutic activity, 97112- Neuromuscular re-education, 97535- Self Care, 24401- Manual therapy, 97033- Ionotophoresis 4mg /ml Dexamethasone, Dry Needling, Joint mobilization, Spinal mobilization, Cryotherapy, and Moist heat  PLAN FOR NEXT SESSION: assess updated HEP; hip rotator ROM, strengthening, possible DN to R QL, hip flexor strengthening 90/90 position    Claude Manges, PT 07/02/23 5:20 PM

## 2023-07-13 ENCOUNTER — Ambulatory Visit: Payer: Medicare Other

## 2023-07-13 DIAGNOSIS — M25651 Stiffness of right hip, not elsewhere classified: Secondary | ICD-10-CM

## 2023-07-13 DIAGNOSIS — M6281 Muscle weakness (generalized): Secondary | ICD-10-CM

## 2023-07-13 DIAGNOSIS — I2102 ST elevation (STEMI) myocardial infarction involving left anterior descending coronary artery: Secondary | ICD-10-CM | POA: Diagnosis not present

## 2023-07-13 DIAGNOSIS — M25652 Stiffness of left hip, not elsewhere classified: Secondary | ICD-10-CM

## 2023-07-13 DIAGNOSIS — R252 Cramp and spasm: Secondary | ICD-10-CM

## 2023-07-13 DIAGNOSIS — R079 Chest pain, unspecified: Secondary | ICD-10-CM | POA: Diagnosis not present

## 2023-07-13 DIAGNOSIS — M542 Cervicalgia: Secondary | ICD-10-CM

## 2023-07-13 DIAGNOSIS — M25551 Pain in right hip: Secondary | ICD-10-CM

## 2023-07-13 DIAGNOSIS — R293 Abnormal posture: Secondary | ICD-10-CM

## 2023-07-13 DIAGNOSIS — R262 Difficulty in walking, not elsewhere classified: Secondary | ICD-10-CM

## 2023-07-13 NOTE — Therapy (Signed)
OUTPATIENT PHYSICAL THERAPY LOWER EXTREMITY TREATMENT / CERVICAL TREATMENT NOTE   Patient Name: PATON CRUM MRN: [id number] DOB:Jun 10, 1955, 68 y.o., male Today's Date: 07/13/2023  END OF SESSION:  PT End of Session - 07/13/23 0932     Visit Number 3    Date for PT Re-Evaluation 08/08/23    Authorization Type MCR    Progress Note Due on Visit 10    PT Start Time 0933    PT Stop Time 1015    PT Time Calculation (min) 42 min    Activity Tolerance Patient tolerated treatment well    Behavior During Therapy [facility] for tasks assessed/performed              Past Medical History:  Diagnosis Date   Allergic rhinitis    pollen, animal hair, dust mites   Allergy    Crohn disease (HCC)    Diverticulosis    Hemorrhoids, internal, thrombosed s/p resection/pexy 12/06/2012 12/04/2012   HLD (hyperlipidemia)    Obesity    Regional enteritis (Crohn's disease) (HCC) 08/01/2007   Qualifier: Diagnosis of  By: Hetty Ely MD, Franne Grip    Small bowel obstruction [facility])    Past Surgical History:  Procedure Laterality Date   APPENDECTOMY     COLON RESECTION N/A 09/30/2014   Procedure: DIAGNOSTIC LAPAROSCOPY WITH EXPLORATORY LAP WITH SMALL BOWEL RESECTION AND ANASTAMOSIS;  Surgeon: Axel Filler, MD;  Location: [facility];  Service: General;  Laterality: N/A;   COLONOSCOPY     HEMORROIDECTOMY     LAMINECTOMY     RETINAL TEAR REPAIR CRYOTHERAPY Right 06/09/14   repaired with laser   TONSILLECTOMY     Patient Active Problem List   Diagnosis Date Noted   Dyslipidemia 03/03/2019   Family history of early CAD 03/03/2019   Abdominal pain    S/P small bowel resection    SBO (small bowel obstruction) (HCC) 09/29/2014   Diverticulosis 12/04/2012   Hemorrhoids, internal, thrombosed s/p resection/pexy 12/06/2012 12/04/2012   HERPES SIMPLEX INFECTION 08/01/2007   HYPERCHOLESTEROLEMIA 08/01/2007   Regional enteritis (Crohn's disease) (HCC) 08/01/2007    PCP: Cleatis Polka., MD   REFERRING  PROVIDER: Lenda Kelp, MD   REFERRING DIAG:  [phone] (ICD-10-CM) - Bilateral hip pain  M53.3 (ICD-10-CM) - SI (sacroiliac) joint dysfunction  Evaluate and treat for SI joint dysfunction and B/L IT band syndrome.   N56.213Y (ICD-10-CM) - Strain of left trapezius muscle, initial encounter  THERAPY DIAG:  Stiffness of right hip, not elsewhere classified  Stiffness of left hip, not elsewhere classified  Cramp and spasm  Pain of both hip joints  Cervicalgia  Muscle weakness (generalized)  Difficulty in walking, not elsewhere classified  Abnormal posture  Rationale for Evaluation and Treatment: Rehabilitation  ONSET DATE: 1 year  SUBJECTIVE:   SUBJECTIVE STATEMENT: Patient reports he is about the same.  We discuss some history of the left shoulder and neck and his laminectomy 35 years ago.  He states he never really had rehab or any strengthening for the left shoulder that was affected by the nerve root impingement.  He also c/o significant lack of flexibility throughout his hips.    From Eval:  Patient reports increased activity with pickleball upon retirement has caused his hips to tighen up. Putting on socks is more difficult R>L.   PERTINENT HISTORY: R sciatica; Lt sided cervical laminectomy 30 years ago PAIN:  Are you having pain? Yes: NPRS scale: 3-4/10 Pain location: at greater trochanter Pain description: ache Aggravating factors:  activity Relieving factors: stretches some  PRECAUTIONS: None  RED FLAGS: None   WEIGHT BEARING RESTRICTIONS: No  FALLS:  Has patient fallen in last 6 months? 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  DIAGNOSTIC FINDINGS: none  PATIENT SURVEYS:  LEFS 78 / 80 = 97.5 %  COGNITION: Overall cognitive status: Within functional limits for tasks assessed     SENSATION: WFL   MUSCLE LENGTH: Tightness in  B HS, quads, hip flexors, piriformis,  R>L  POSTURE: rounded shoulders and forward head  PALPATION: Palpation: TTP at R QL. Increased tissue tension in R QL Spinal Mobility: decreased R lumbar 07/02/2023 increased muscle spasms of bilateral upper trap  LUMBAR ROM: WFL but tight in B SB, rotation and flexion limited by HS tightness. Quick pinch of pain with R SB and uncomfortable with R quadrant testing.  CERVICAL ROM:  Active ROM 07/02/2023   Flexion 45  Extension 30  Right Lateral Flexion 25  Left Lateral Flexion 30  Right Rotation 65  Left Rotation 70     LOWER EXTREMITY ROM:  Active ROM Right eval Left eval  Hip flexion    Hip extension    Hip abduction    Hip adduction    Hip internal rotation 25 30  Hip external rotation 29 22  Knee flexion    Knee extension    Ankle dorsiflexion    Ankle plantarflexion    Ankle inversion    Ankle eversion     (Blank rows = not tested)  LOWER EXTREMITY MMT:  MMT Right eval Left eval  Hip flexion 4 4+  Hip extension 5 5  Hip abduction 5 5  Hip adduction    Hip internal rotation    Hip external rotation    Knee flexion 5 5  Knee extension 5 5  Ankle dorsiflexion    Ankle plantarflexion    Ankle inversion    Ankle eversion     (Blank rows = not tested)  LOWER EXTREMITY SPECIAL TESTS:  Hip special tests: Luisa Hart (FABER) test: negative and Hip scouring test: negative                                                                                                                                TREATMENT DATE:   07/13/2023 NuStep Level 5 8 mins PT present to discuss status Standing hamstring stretch 3 x 30 sec each LE Standing quad/hip flexor stretch 3 x 30 sec each LE Seated piriformis  stretch 3 x 30  Supine IT band stretch 3 x 30 sec each LE Prone shoulder extension left with 2# x 20 Prone shoulder row left  with 2# x 20 Prone shoulder horizontal abduction left with 2# x 20 Side lying left shoulder ER with 2# x 20 Supine serratus punch with 2# x 20  Educated patient on shoulder and neck complex and how rotator cuff issues may result in neck pain.  Issued additional exercises to HEP.  07/02/2023 NuStep Level 5 5 mins PT present to discuss status Cervical ROM measurements, palpation & discussion of neck pain Seated upper trap stretch x 30 sec bilateral  Seated levator trap stretch x 30 sec bilateral  Seated hip internal rotation x 10 Seated hamstring stretch x 30 sec bilateral  Supine hip internal/ external rotation x 5 each direction Dynamic hamstring stretch x 10 bilateral  Standing shoulder rows & extension with green TB 2 x 10 Standing shoulder ER with green TB 2 x 10 Standing shoulder abduction with green TB 2 x 10  06/27/23 See pt ed and HEP   PATIENT EDUCATION:  Education details: PT eval findings, anticipated POC, initial HEP, and HEP review  Person educated: Patient Education method: Explanation, Demonstration, Verbal cues, and Handouts Education comprehension: verbalized understanding and returned demonstration  HOME EXERCISE PROGRAM: Access Code: [email or web address] URL: [email or web address] Date: 07/13/2023 Prepared by: Mikey Kirschner  Exercises - Seated Hip Internal Rotation Active ROM  - 1 x daily - 7 x weekly - 2-3 sets - 10 reps - Left hip internal rotation stretch  - 1 x daily - 7 x weekly - 1 sets - 2 reps - 30 sec hold - Seated Hamstring Stretch  - 2 x daily - 7 x weekly - 1 sets - 3 reps - 30-60 sec hold - Dynamic HS Stretch  - 1 x daily - 3 x weekly - 2 sets - 10 reps - Seated Upper Trapezius Stretch  - 1 x daily - 7 x weekly - 2 sets - 30 hold - Seated Levator Scapulae Stretch  - 1 x daily - 7 x weekly - 2 sets - 30 hold - Seated  Piriformis Stretch with Trunk Bend  - 1 x daily - 7 x weekly - 2 sets - 20 hold - Standing Shoulder Row with Anchored Resistance  - 1 x daily - 7 x weekly - 2 sets - 10 reps - Standing Shoulder Horizontal Abduction with Resistance  - 1 x daily - 7 x weekly - 2 sets - 10 reps - Shoulder External Rotation and Scapular Retraction with Resistance  - 1 x daily - 7 x weekly - 2 sets - 10 reps - Standing Hamstring Stretch on Chair  - 1 x daily - 7 x weekly - 1 sets - 3 reps - 30 sec hold - Standing Quad Stretch with Table and Chair Support  - 1 x daily - 7 x weekly - 1 sets - 3 reps - 30 sec hold - Supine ITB Stretch with Strap  - 1 x daily - 7 x weekly - 1 sets - 3 reps - 30 sec hold - Prone Shoulder Extension - Single Arm  - 2 x daily - 7 x weekly - 2 sets - 10 reps - Prone Shoulder Row  - 2 x daily - 7 x weekly - 2 sets - 10 reps - Prone Single Arm Shoulder Horizontal Abduction with Scapular Retraction and Palm Down  - 2 x daily - 7 x weekly - 2 sets - 10 reps - Sidelying Shoulder External Rotation  - 2 x daily - 7 x weekly - 2 sets - 10 reps - Single  Arm Serratus Punches in Supine with Dumbbell  - 2 x daily - 7 x weekly - 2 sets - 10 reps - doing seated fig 4 and standing ITB at wall from MD.  ASSESSMENT:  CLINICAL IMPRESSION: Rocky Link had no issues with activities today including shoulder exercises.  He is extremely tight in his hips and would benefit from diligent stretching.  His neck pain could be from some rotator cuff weakness that has been present for quite some time.  He mentions a time when he was unable to lift his arm above shoulder level a few years ago.  Patient will benefit from skilled PT to address the below impairments and improve overall function.   OBJECTIVE IMPAIRMENTS: decreased ROM, decreased strength, increased muscle spasms, impaired flexibility, and pain.   ACTIVITY LIMITATIONS: dressing  PARTICIPATION LIMITATIONS:  N/A  PERSONAL FACTORS: Age and Time since onset of  injury/illness/exacerbation are also affecting patient's functional outcome.   REHAB POTENTIAL: Excellent  CLINICAL DECISION MAKING: Stable/uncomplicated  EVALUATION COMPLEXITY: Low   GOALS: Goals reviewed with patient? Yes  SHORT TERM GOALS: Target date: 07/18/2023   Patient will be independent with initial HEP. Baseline:  Goal status: INITIAL  2.  Decreased pain post exercise by 25% Baseline:  Goal status: INITIAL    LONG TERM GOALS: Target date: 08/08/2023   Patient will be independent with advanced/ongoing HEP to improve outcomes and carryover.  Baseline:  Goal status: INITIAL  2.  Patient will report at least >75% improvement in B hip pain post exercise to improve QOL. Baseline:  Goal status: INITIAL  3.  Patient will demonstrate improved R hip flexor strength to 5/5. Baseline:  Goal status: INITIAL  4.  Patient to demonstrate improved hip flexibility by reporting improved ease of donning/doffing shoes and socks by 50% Baseline:  Goal status: INITIAL  5.  Patient will report at least >50% improvement in cervical pain post exercise to improve QOL. Baseline:  Goal status: NEW  6.Patient will Verbalize and demonstrate self-care strategies to manage pain including tissue mobility practices and change of position Baseline:  Goal status: NEW    PLAN:  PT FREQUENCY: 1x/week  PT DURATION: 6 weeks  PLANNED INTERVENTIONS: 97164- PT Re-evaluation, 97110-Therapeutic exercises, 97530- Therapeutic activity, 97112- Neuromuscular re-education, 97535- Self Care, 82956- Manual therapy, 97033- Ionotophoresis 4mg /ml Dexamethasone, Dry Needling, Joint mobilization, Spinal mobilization, Cryotherapy, and Moist heat  PLAN FOR NEXT SESSION: Review updated HEP; hip rotator ROM, strengthening, possible DN to R QL, hip flexor strengthening 90/90 position   [place] B. Izaia Say, PT 07/13/23 11:36 AM [facility] [address] [place],  [place] [address] Phone # [phone] Fax [phone]

## 2023-07-16 ENCOUNTER — Encounter (HOSPITAL_COMMUNITY)
Admission: EM | Disposition: A | Discharge status: Home / Self Care | Payer: Self-pay | Source: Home / Self Care | Attending: Cardiology

## 2023-07-16 ENCOUNTER — Encounter (HOSPITAL_COMMUNITY): Payer: Self-pay

## 2023-07-16 ENCOUNTER — Other Ambulatory Visit: Payer: Self-pay

## 2023-07-16 ENCOUNTER — Inpatient Hospital Stay (HOSPITAL_COMMUNITY): Payer: Medicare Other

## 2023-07-16 ENCOUNTER — Emergency Department (HOSPITAL_COMMUNITY): Payer: Medicare Other

## 2023-07-16 ENCOUNTER — Inpatient Hospital Stay (HOSPITAL_COMMUNITY)
Admission: EM | Admit: 2023-07-16 | Discharge: 2023-07-17 | DRG: 321 | Disposition: A | Discharge status: Home / Self Care | Payer: Medicare Other | Attending: Cardiology | Admitting: Cardiology

## 2023-07-16 DIAGNOSIS — E785 Hyperlipidemia, unspecified: Principal | ICD-10-CM

## 2023-07-16 DIAGNOSIS — Z91199 Patient's noncompliance with other medical treatment and regimen due to unspecified reason: Secondary | ICD-10-CM | POA: Diagnosis not present

## 2023-07-16 DIAGNOSIS — I252 Old myocardial infarction: Secondary | ICD-10-CM | POA: Diagnosis not present

## 2023-07-16 DIAGNOSIS — I2510 Atherosclerotic heart disease of native coronary artery without angina pectoris: Secondary | ICD-10-CM

## 2023-07-16 DIAGNOSIS — I5021 Acute systolic (congestive) heart failure: Secondary | ICD-10-CM | POA: Diagnosis present

## 2023-07-16 DIAGNOSIS — I472 Ventricular tachycardia, unspecified: Secondary | ICD-10-CM | POA: Diagnosis not present

## 2023-07-16 DIAGNOSIS — Z9049 Acquired absence of other specified parts of digestive tract: Secondary | ICD-10-CM | POA: Diagnosis not present

## 2023-07-16 DIAGNOSIS — Z800 Family history of malignant neoplasm of digestive organs: Secondary | ICD-10-CM | POA: Diagnosis not present

## 2023-07-16 DIAGNOSIS — I2102 ST elevation (STEMI) myocardial infarction involving left anterior descending coronary artery: Principal | ICD-10-CM | POA: Diagnosis present

## 2023-07-16 DIAGNOSIS — Z83719 Family history of colon polyps, unspecified: Secondary | ICD-10-CM

## 2023-07-16 DIAGNOSIS — I2111 ST elevation (STEMI) myocardial infarction involving right coronary artery: Secondary | ICD-10-CM | POA: Diagnosis present

## 2023-07-16 DIAGNOSIS — Z79899 Other long term (current) drug therapy: Secondary | ICD-10-CM | POA: Diagnosis not present

## 2023-07-16 DIAGNOSIS — R079 Chest pain, unspecified: Secondary | ICD-10-CM | POA: Diagnosis present

## 2023-07-16 DIAGNOSIS — E7800 Pure hypercholesterolemia, unspecified: Secondary | ICD-10-CM | POA: Diagnosis present

## 2023-07-16 DIAGNOSIS — Z955 Presence of coronary angioplasty implant and graft: Secondary | ICD-10-CM

## 2023-07-16 DIAGNOSIS — Z8249 Family history of ischemic heart disease and other diseases of the circulatory system: Secondary | ICD-10-CM | POA: Diagnosis not present

## 2023-07-16 DIAGNOSIS — K5090 Crohn's disease, unspecified, without complications: Secondary | ICD-10-CM | POA: Diagnosis present

## 2023-07-16 DIAGNOSIS — I4729 Other ventricular tachycardia: Secondary | ICD-10-CM | POA: Insufficient documentation

## 2023-07-16 HISTORY — PX: CORONARY/GRAFT ACUTE MI REVASCULARIZATION: CATH118305

## 2023-07-16 HISTORY — PX: LEFT HEART CATH AND CORONARY ANGIOGRAPHY: CATH118249

## 2023-07-16 LAB — CBC WITH DIFFERENTIAL/PLATELET
Abs Immature Granulocytes: 0.02 10*3/uL (ref 0.00–0.07)
Basophils Absolute: 0 10*3/uL (ref 0.0–0.1)
Basophils Relative: 0 %
Eosinophils Absolute: 0.1 10*3/uL (ref 0.0–0.5)
Eosinophils Relative: 1 %
HCT: 46.2 % (ref 39.0–52.0)
Hemoglobin: 15.9 g/dL (ref 13.0–17.0)
Immature Granulocytes: 0 %
Lymphocytes Relative: 16 %
Lymphs Abs: 1.2 10*3/uL (ref 0.7–4.0)
MCH: 31.3 pg (ref 26.0–34.0)
MCHC: 34.4 g/dL (ref 30.0–36.0)
MCV: 90.9 fL (ref 80.0–100.0)
Monocytes Absolute: 0.6 10*3/uL (ref 0.1–1.0)
Monocytes Relative: 8 %
Neutro Abs: 5.6 10*3/uL (ref 1.7–7.7)
Neutrophils Relative %: 75 %
Platelets: 196 10*3/uL (ref 150–400)
RBC: 5.08 MIL/uL (ref 4.22–5.81)
RDW: 12.9 % (ref 11.5–15.5)
WBC: 7.4 10*3/uL (ref 4.0–10.5)
nRBC: 0 % (ref 0.0–0.2)

## 2023-07-16 LAB — CBC
HCT: 42.7 % (ref 39.0–52.0)
Hemoglobin: 15 g/dL (ref 13.0–17.0)
MCH: 31.1 pg (ref 26.0–34.0)
MCHC: 35.1 g/dL (ref 30.0–36.0)
MCV: 88.6 fL (ref 80.0–100.0)
Platelets: 189 10*3/uL (ref 150–400)
RBC: 4.82 MIL/uL (ref 4.22–5.81)
RDW: 13 % (ref 11.5–15.5)
WBC: 10.3 10*3/uL (ref 4.0–10.5)
nRBC: 0 % (ref 0.0–0.2)

## 2023-07-16 LAB — COMPREHENSIVE METABOLIC PANEL
ALT: 16 U/L (ref 0–44)
AST: 21 U/L (ref 15–41)
Albumin: 3.9 g/dL (ref 3.5–5.0)
Alkaline Phosphatase: 50 U/L (ref 38–126)
Anion gap: 8 (ref 5–15)
BUN: 18 mg/dL (ref 8–23)
CO2: 25 mmol/L (ref 22–32)
Calcium: 9.2 mg/dL (ref 8.9–10.3)
Chloride: 106 mmol/L (ref 98–111)
Creatinine, Ser: 1.13 mg/dL (ref 0.61–1.24)
GFR, Estimated: >60 mL/min (ref >60)
Glucose, Bld: 107 mg/dL — ABNORMAL HIGH (ref 70–99)
Potassium: 4.3 mmol/L (ref 3.5–5.1)
Sodium: 139 mmol/L (ref 135–145)
Total Bilirubin: 0.8 mg/dL (ref 0.0–1.2)
Total Protein: 6.8 g/dL (ref 6.5–8.1)

## 2023-07-16 LAB — PROTIME-INR
INR: 1 (ref 0.8–1.2)
Prothrombin Time: 13.3 s (ref 11.4–15.2)

## 2023-07-16 LAB — TROPONIN I (HIGH SENSITIVITY)
Troponin I (High Sensitivity): 76 ng/L — ABNORMAL HIGH (ref <18)
Troponin I (High Sensitivity): 881 ng/L (ref <18)
Troponin I (High Sensitivity): 2045 ng/L (ref <18)

## 2023-07-16 LAB — POCT ACTIVATED CLOTTING TIME
Activated Clotting Time: 297 s
Activated Clotting Time: 648 s

## 2023-07-16 LAB — LIPID PANEL
Cholesterol: 289 mg/dL — ABNORMAL HIGH (ref 0–200)
HDL: 49 mg/dL (ref >40)
LDL Cholesterol: 206 mg/dL — ABNORMAL HIGH (ref 0–99)
Total CHOL/HDL Ratio: 5.9 {ratio}
Triglycerides: 172 mg/dL — ABNORMAL HIGH (ref <150)
VLDL: 34 mg/dL (ref 0–40)

## 2023-07-16 LAB — MAGNESIUM: Magnesium: 1.8 mg/dL (ref 1.7–2.4)

## 2023-07-16 LAB — APTT: aPTT: 25 s (ref 24–36)

## 2023-07-16 LAB — CREATININE, SERUM
Creatinine, Ser: 0.94 mg/dL (ref 0.61–1.24)
GFR, Estimated: >60 mL/min

## 2023-07-16 LAB — HIV ANTIBODY (ROUTINE TESTING W REFLEX): HIV Screen 4th Generation wRfx: NONREACTIVE

## 2023-07-16 LAB — CG4 I-STAT (LACTIC ACID): Lactic Acid, Venous: 1 mmol/L (ref 0.5–1.9)

## 2023-07-16 LAB — MRSA NEXT GEN BY PCR, NASAL: MRSA by PCR Next Gen: NOT DETECTED

## 2023-07-16 LAB — HEMOGLOBIN A1C
Hgb A1c MFr Bld: 5 % (ref 4.8–5.6)
Mean Plasma Glucose: 96.8 mg/dL

## 2023-07-16 SURGERY — CORONARY/GRAFT ACUTE MI REVASCULARIZATION
Anesthesia: LOCAL

## 2023-07-16 MED ORDER — HEPARIN SODIUM (PORCINE) 1000 UNIT/ML IJ SOLN
INTRAMUSCULAR | Status: DC | PRN
  Administered 2023-07-16: 6000 [IU] via INTRAVENOUS

## 2023-07-16 MED ORDER — HYDRALAZINE HCL 20 MG/ML IJ SOLN: 10.0000 mg | INTRAMUSCULAR | Status: AC | PRN

## 2023-07-16 MED ORDER — MORPHINE SULFATE (PF) 2 MG/ML IV SOLN
2.0000 mg | Freq: Once | INTRAVENOUS | Status: AC
  Administered 2023-07-16: 2 mg via INTRAVENOUS
  Filled 2023-07-16: qty 1

## 2023-07-16 MED ORDER — PRASUGREL HCL 10 MG PO TABS
10.0000 mg | ORAL_TABLET | Freq: Every day | ORAL | Status: DC
  Administered 2023-07-17: 10 mg via ORAL
  Filled 2023-07-16: qty 1

## 2023-07-16 MED ORDER — LIDOCAINE HCL (PF) 1 % IJ SOLN
INTRAMUSCULAR | Status: AC
  Filled 2023-07-16: qty 30

## 2023-07-16 MED ORDER — VERAPAMIL HCL 2.5 MG/ML IV SOLN
INTRAVENOUS | Status: AC
  Filled 2023-07-16: qty 2

## 2023-07-16 MED ORDER — LABETALOL HCL 5 MG/ML IV SOLN: 10.0000 mg | INTRAVENOUS | Status: AC | PRN

## 2023-07-16 MED ORDER — FENTANYL CITRATE (PF) 100 MCG/2ML IJ SOLN
INTRAMUSCULAR | Status: AC
  Filled 2023-07-16: qty 2

## 2023-07-16 MED ORDER — SODIUM CHLORIDE 0.9% FLUSH
3.0000 mL | Freq: Two times a day (BID) | INTRAVENOUS | Status: DC
  Administered 2023-07-16 – 2023-07-17 (×3): 3 mL via INTRAVENOUS

## 2023-07-16 MED ORDER — HEPARIN SODIUM (PORCINE) 1000 UNIT/ML IJ SOLN
INTRAMUSCULAR | Status: AC
  Filled 2023-07-16: qty 10

## 2023-07-16 MED ORDER — ASPIRIN 81 MG PO TBEC
81.0000 mg | DELAYED_RELEASE_TABLET | Freq: Every day | ORAL | Status: DC
  Administered 2023-07-17: 81 mg via ORAL
  Filled 2023-07-16: qty 1

## 2023-07-16 MED ORDER — HEPARIN SODIUM (PORCINE) 5000 UNIT/ML IJ SOLN
4000.0000 [IU] | Freq: Once | INTRAMUSCULAR | Status: AC
  Administered 2023-07-16: 4000 [IU] via INTRAVENOUS

## 2023-07-16 MED ORDER — IOHEXOL 350 MG/ML SOLN
INTRAVENOUS | Status: DC | PRN
  Administered 2023-07-16: 160 mL

## 2023-07-16 MED ORDER — ORAL CARE MOUTH RINSE: 15.0000 mL | OROMUCOSAL | Status: DC | PRN

## 2023-07-16 MED ORDER — SODIUM CHLORIDE 0.9% FLUSH: 3.0000 mL | INTRAVENOUS | Status: DC | PRN

## 2023-07-16 MED ORDER — MIDAZOLAM HCL 2 MG/2ML IJ SOLN
INTRAMUSCULAR | Status: DC | PRN
  Administered 2023-07-16: 1 mg via INTRAVENOUS

## 2023-07-16 MED ORDER — NITROGLYCERIN 0.4 MG SL SUBL
SUBLINGUAL_TABLET | SUBLINGUAL | Status: AC
  Filled 2023-07-16: qty 1

## 2023-07-16 MED ORDER — NITROGLYCERIN 0.4 MG SL SUBL: 0.4000 mg | SUBLINGUAL_TABLET | SUBLINGUAL | Status: DC | PRN

## 2023-07-16 MED ORDER — ACETAMINOPHEN 325 MG PO TABS
650.0000 mg | ORAL_TABLET | ORAL | Status: DC | PRN
  Administered 2023-07-16: 650 mg via ORAL
  Filled 2023-07-16: qty 2

## 2023-07-16 MED ORDER — PRASUGREL HCL 10 MG PO TABS
ORAL_TABLET | ORAL | Status: DC | PRN
  Administered 2023-07-16: 60 mg via ORAL

## 2023-07-16 MED ORDER — ENOXAPARIN SODIUM 40 MG/0.4ML IJ SOSY: 40.0000 mg | PREFILLED_SYRINGE | INTRAMUSCULAR | Status: DC

## 2023-07-16 MED ORDER — HEPARIN (PORCINE) IN NACL 2000-0.9 UNIT/L-% IV SOLN
INTRAVENOUS | Status: DC | PRN
  Administered 2023-07-16: 1000 mL

## 2023-07-16 MED ORDER — ENOXAPARIN SODIUM 40 MG/0.4ML IJ SOSY
40.0000 mg | PREFILLED_SYRINGE | INTRAMUSCULAR | Status: DC
  Administered 2023-07-17: 40 mg via SUBCUTANEOUS
  Filled 2023-07-16: qty 0.4

## 2023-07-16 MED ORDER — MIDAZOLAM HCL 2 MG/2ML IJ SOLN
INTRAMUSCULAR | Status: AC
  Filled 2023-07-16: qty 2

## 2023-07-16 MED ORDER — ASPIRIN 81 MG PO CHEW
243.0000 mg | CHEWABLE_TABLET | Freq: Once | ORAL | Status: AC
  Administered 2023-07-16: 243 mg via ORAL

## 2023-07-16 MED ORDER — ATORVASTATIN CALCIUM 80 MG PO TABS
80.0000 mg | ORAL_TABLET | Freq: Every day | ORAL | Status: DC
  Administered 2023-07-16 – 2023-07-17 (×2): 80 mg via ORAL
  Filled 2023-07-16 (×2): qty 1

## 2023-07-16 MED ORDER — ONDANSETRON HCL 4 MG/2ML IJ SOLN: 4.0000 mg | Freq: Four times a day (QID) | INTRAMUSCULAR | Status: DC | PRN

## 2023-07-16 MED ORDER — NITROGLYCERIN 0.4 MG SL SUBL
0.4000 mg | SUBLINGUAL_TABLET | SUBLINGUAL | Status: DC | PRN
  Administered 2023-07-16: 0.4 mg via SUBLINGUAL
  Filled 2023-07-16: qty 1

## 2023-07-16 MED ORDER — SODIUM CHLORIDE 0.9 % IV SOLN
250.0000 mL | INTRAVENOUS | Status: AC | PRN
  Administered 2023-07-16: 250 mL via INTRAVENOUS

## 2023-07-16 MED ORDER — NITROGLYCERIN 1 MG/10 ML FOR IR/CATH LAB
INTRA_ARTERIAL | Status: DC | PRN
  Administered 2023-07-16: 200 ug via INTRACORONARY

## 2023-07-16 MED ORDER — LOSARTAN POTASSIUM 50 MG PO TABS
25.0000 mg | ORAL_TABLET | Freq: Every day | ORAL | Status: DC
  Administered 2023-07-17: 25 mg via ORAL
  Filled 2023-07-16: qty 1

## 2023-07-16 MED ORDER — CARVEDILOL 3.125 MG PO TABS
6.2500 mg | ORAL_TABLET | Freq: Two times a day (BID) | ORAL | Status: DC
  Administered 2023-07-16 – 2023-07-17 (×2): 6.25 mg via ORAL
  Filled 2023-07-16 (×2): qty 2

## 2023-07-16 MED ORDER — LIDOCAINE HCL (PF) 1 % IJ SOLN
INTRAMUSCULAR | Status: DC | PRN
  Administered 2023-07-16: 5 mL

## 2023-07-16 MED ORDER — FENTANYL CITRATE (PF) 100 MCG/2ML IJ SOLN
INTRAMUSCULAR | Status: DC | PRN
  Administered 2023-07-16: 25 ug via INTRAVENOUS

## 2023-07-16 MED ORDER — ASPIRIN 81 MG PO CHEW
CHEWABLE_TABLET | ORAL | Status: AC
  Filled 2023-07-16: qty 4

## 2023-07-16 SURGICAL SUPPLY — 27 items
BALL SAPPHIRE NC24 2.75X10 (BALLOONS) ×1
BALL SAPPHIRE NC24 3.5X8 (BALLOONS) ×1
BALLN EMERGE MR 2.0X12 (BALLOONS) ×1
BALLN EMERGE MR 2.5X12 (BALLOONS) ×1
BALLOON EMERGE MR 2.0X12 (BALLOONS) IMPLANT
BALLOON EMERGE MR 2.5X12 (BALLOONS) IMPLANT
BALLOON SAPPHIRE NC24 2.75X10 (BALLOONS) IMPLANT
BALLOON SAPPHIRE NC24 3.5X8 (BALLOONS) IMPLANT
CATH 5FR JL3.5 JR4 ANG PIG MP (CATHETERS) IMPLANT
CATH LAUNCHER 6FR EBU3.5 (CATHETERS) IMPLANT
CATH VISTA GUIDE 6FR JR4 (CATHETERS) ×1
CATH VISTA GUIDE 6FR JR4 ECOPK (CATHETERS) IMPLANT
DEVICE RAD COMP TR BAND LRG (VASCULAR PRODUCTS) IMPLANT
ELECT DEFIB PAD ADLT CADENCE (PAD) IMPLANT
GLIDESHEATH SLEND SS 6F .021 (SHEATH) IMPLANT
GUIDEWIRE INQWIRE 1.5J.035X260 (WIRE) IMPLANT
INQWIRE 1.5J .035X260CM (WIRE) ×1
KIT ENCORE 26 ADVANTAGE (KITS) IMPLANT
PACK CARDIAC CATHETERIZATION (CUSTOM PROCEDURE TRAY) ×1 IMPLANT
SET ATX-X65L (MISCELLANEOUS) IMPLANT
SHEATH PROBE COVER 6X72 (BAG) IMPLANT
STENT SYNERGY XD 2.50X16 (Permanent Stent) IMPLANT
STENT SYNERGY XD 3.0X12 (Permanent Stent) IMPLANT
SYNERGY XD 2.50X16 (Permanent Stent) ×1 IMPLANT
SYNERGY XD 3.0X12 (Permanent Stent) ×1 IMPLANT
TUBING CIL FLEX 10 FLL-RA (TUBING) IMPLANT
WIRE ASAHI PROWATER 180CM (WIRE) IMPLANT

## 2023-07-16 NOTE — ED Triage Notes (Signed)
Reports was playing pickleball and suddenly had chest pain left arm pain and diaphoresis. Took 81mg  ASA pta but in triage gave additional 3 asa and 1 nitro

## 2023-07-16 NOTE — ED Notes (Signed)
Transported to Cath lab.

## 2023-07-16 NOTE — H&P (Signed)
Cardiology Admission History and Physical   Patient ID: ORA MCNATT MRN: [id number]; DOB: [birth year]   Admission date: 07/16/2023  PCP:  Cleatis Polka., MD   [facility] Providers Cardiologist:  Rollene Rotunda, MD      Chief Complaint:  Chest pain/STEMI  Patient Profile:   Terry Kerr is a 68 y.o. male with HLD, crohn's disease s/p colon resection '16  who is being seen 07/16/2023 for the evaluation of chest pain/STEMI.  History of Present Illness:   Terry Kerr is a 68 yo male with PMH noted above. He was seen by Dr. Antoine Poche 2020 for evaluation of shoulder pain/dyspnea. Referred to coronary calcium score of 23, 39th percentile and exercise treadmill test.   Reports he has been in his usual state of health until today. He was playing pickle ball on the court and developed centralized chest pain with radiation into the left arm. Also with diaphoresis. Was able to finish the game but continued to have pain. He was brought to the ED by his neighbor, and noted to be diaphoretic in triage. EKG showed sinus rhythm, 71bpm with inferolateral ST elevation. CODE STEMI called in the ED. Given 4000 units of heparin, ASA, morphine and 1 SL NTG. Brought directly to the cath lab for emergent cardiac cath.   Past Medical History:  Diagnosis Date   Allergic rhinitis    pollen, animal hair, dust mites   Allergy    Crohn disease (HCC)    Diverticulosis    Hemorrhoids, internal, thrombosed s/p resection/pexy 12/06/2012 12/04/2012   HLD (hyperlipidemia)    Obesity    Regional enteritis (Crohn's disease) (HCC) 08/01/2007   Qualifier: Diagnosis of  By: Hetty Ely MD, Franne Grip    Small bowel obstruction [facility])     Past Surgical History:  Procedure Laterality Date   APPENDECTOMY     COLON RESECTION N/A 09/30/2014   Procedure: DIAGNOSTIC LAPAROSCOPY WITH EXPLORATORY LAP WITH SMALL BOWEL RESECTION AND ANASTAMOSIS;  Surgeon: Axel Filler, MD;  Location: [facility];  Service: General;   Laterality: N/A;   COLONOSCOPY     HEMORROIDECTOMY     LAMINECTOMY     RETINAL TEAR REPAIR CRYOTHERAPY Right 06/09/14   repaired with laser   TONSILLECTOMY       Medications Prior to Admission: Prior to Admission medications   Medication Sig Start Date End Date Taking? Authorizing Provider  atorvastatin (LIPITOR) 40 MG tablet Take 40 mg by mouth daily.    [provider]  Cholecalciferol (VITAMIN D) 2000 UNITS tablet Take 2,000 Units by mouth daily.    [provider]  fexofenadine (ALLEGRA) 30 MG tablet Take 30 mg by mouth daily as needed (for allergies).     [provider]  HUMIRA, 2 PEN, 40 MG/0.4ML pen INJECT 1 PEN UNDER THE SKIN EVERY 14 DAYS. 04/05/23   Pyrtle, Carie Caddy, MD  hyoscyamine (LEVSIN SL) 0.125 MG SL tablet Take 1-2 tablet every 4-6 hours as needed for abdominal pain/spasm 07/19/22   Arnaldo Natal, NP  Vitamin D, Ergocalciferol, (DRISDOL) 1.25 MG (50000 UNIT) CAPS capsule Take 1 capsule (50,000 Units total) by mouth every 7 (seven) days. 07/24/22   Arnaldo Natal, NP     Allergies:    Allergies  Allergen Reactions   Dust Mite Extract Other (See Comments)   Grass Pollen(K-O-R-T-Swt Vern) Other (See Comments)   Molds & Smuts Other (See Comments)    Social History:   Social History   Socioeconomic History  Marital status: Married    Spouse name: Not on file   Number of children: 1   Years of education: Not on file   Highest education level: Not on file  Occupational History   Occupation: [employer]  Tobacco Use   Smoking status: Never   Smokeless tobacco: Never  Substance and Sexual Activity   Alcohol use: Yes    Alcohol/week: 0.0 standard drinks of alcohol    Comment: glass of wine occasionally    Drug use: No   Sexual activity: Not on file  Other Topics Concern   Not on file  Social History Narrative   Not on file   Social Drivers of Health   Financial Resource Strain: Not on file  Food Insecurity: Not  on file  Transportation Needs: Not on file  Physical Activity: Not on file  Stress: Not on file  Social Connections: Not on file  Intimate Partner Violence: Not on file    Family History:   The patient's family history includes Colon polyps in his sister; Heart disease in his father, maternal grandfather, and paternal grandfather; Pancreatic cancer in his mother. There is no history of Colon cancer, Rectal cancer, or Stomach cancer.    ROS:  Please see the history of present illness.  All other ROS reviewed and negative.     Physical Exam/Data:   Vitals:   07/16/23 1306 07/16/23 1318 07/16/23 1325 07/16/23 1343  BP: (!) 145/82  (!) 149/84   Pulse: 72  74   Resp: 16  (!) 25   Temp: 98.1 F (36.7 C)     TempSrc: Oral     SpO2: 100%  99% 100%  Weight:  86.2 kg    Height:  5\' 8"  (1.727 m)     No intake or output data in the 24 hours ending 07/16/23 1404    07/16/2023    1:18 PM 06/11/2023    8:59 AM 07/19/2022    9:04 AM  Last 3 Weights  Weight (lbs) 190 lb 190 lb 184 lb  Weight (kg) 86.183 kg 86.183 kg 83.462 kg     Body mass index is 28.89 kg/m.  General:  Well nourished, well developed, in no acute distress HEENT: normal Neck: no JVD Vascular: No carotid bruits; Distal pulses 2+ bilaterally   Cardiac:  normal S1, S2; RRR; no murmur  Lungs:  clear to auscultation bilaterally, no wheezing, rhonchi or rales  Abd: soft, nontender, no hepatomegaly  Ext: no edema Musculoskeletal:  No deformities, BUE and BLE strength normal and equal Skin: warm and dry  Neuro:  CNs 2-12 intact, no focal abnormalities noted Psych:  Normal affect    EKG:  The ECG that was done 2/17 was personally reviewed and demonstrates sinus rhythm, 71 bpm with inferolateral ST elevation  Relevant CV Studies:  N/a   Laboratory Data:  High Sensitivity Troponin:   Recent Labs  Lab 07/16/23 1317  TROPONINIHS 76*      Chemistry Recent Labs  Lab 07/16/23 1317  NA 139  K 4.3  CL 106  CO2 25   GLUCOSE 107*  BUN 18  CREATININE 1.13  CALCIUM 9.2  GFRNONAA >60  ANIONGAP 8    Recent Labs  Lab 07/16/23 1317  PROT 6.8  ALBUMIN 3.9  AST 21  ALT 16  ALKPHOS 50  BILITOT 0.8   Lipids  Recent Labs  Lab 07/16/23 1309  CHOL 289*  TRIG 172*  HDL 49  LDLCALC 206*  CHOLHDL 5.9  Hematology Recent Labs  Lab 07/16/23 1317  WBC 7.4  RBC 5.08  HGB 15.9  HCT 46.2  MCV 90.9  MCH 31.3  MCHC 34.4  RDW 12.9  PLT 196   Thyroid No results for input(s): "TSH", "FREET4" in the last 168 hours. BNPNo results for input(s): "BNP", "PROBNP" in the last 168 hours.  DDimer No results for input(s): "DDIMER" in the last 168 hours.   Radiology/Studies:  No results found.   Assessment and Plan:   Terry Kerr is a 68 y.o. male with HLD, crohn's disease s/p colon resection '16  who is being seen 07/16/2023 for the evaluation of chest pain/STEMI.  Inferolateral STEMI -- presented with chest paint that started while playing pickleball this afternoon. Centralized chest pain with radiation into the left arm with associated diaphoresis. EKG showed sinus rhythm with ST elevated in inferolateral leads. Received IV heparin, ASA, morphine and SL NTG in the ED. Repeat EKG showed improved ST segments in inferolateral leads. Brought emergently to the cath lab for cardiac catheterization.  -- further recommendations pending cath, anticipate routine post MI care  HLD -- LDL 206, HDL 49 -- atorvastatin 40mg  daily PTA  Crohn's -- follows with GI, on humira    Risk Assessment/Risk Scores:    TIMI Risk Score for ST  Elevation MI:   The patient's TIMI risk score is 0, which indicates a 0.8% risk of all cause mortality at 30 days.   Code Status: Full Code  Severity of Illness: The appropriate patient status for this patient is INPATIENT. Inpatient status is judged to be reasonable and necessary in order to provide the required intensity of service to ensure the patient's safety. The  patient's presenting symptoms, physical exam findings, and initial radiographic and laboratory data in the context of their chronic comorbidities is felt to place them at high risk for further clinical deterioration. Furthermore, it is not anticipated that the patient will be medically stable for discharge from the hospital within 2 midnights of admission.   * I certify that at the point of admission it is my clinical judgment that the patient will require inpatient hospital care spanning beyond 2 midnights from the point of admission due to high intensity of service, high risk for further deterioration and high frequency of surveillance required.*   For questions or updates, please contact [facility] Please consult [email or web address] for contact info under     Signed, [name], NP  07/16/2023 2:04 PM

## 2023-07-16 NOTE — ED Provider Triage Note (Cosign Needed Addendum)
Emergency Medicine Provider Triage Evaluation Note  Terry Kerr , a 68 y.o. male  was evaluated in triage.  Pt complains of chest pain.  Started an hour ago.  Feels like pressure.  Worse with exertion.  Radiates down the left arm.  Also endorsing shortness of breath.  Denies history of MI.   Review of Systems  Positive: See above Negative: See above  Physical Exam  BP (!) 145/82 (BP Location: Left Arm)   Pulse 72   Temp 98.1 F (36.7 C) (Oral)   Resp 16   SpO2 100%  Gen:   Awake, no distress   Resp:  Normal effort  MSK:   Moves extremities without difficulty  Other:    Medical Decision Making  Medically screening exam initiated at 1:10 PM.  Appropriate orders placed.  AMAL SAIKI was informed that the remainder of the evaluation will be completed by another provider, this initial triage assessment does not replace that evaluation, and the importance of remaining in the ED until their evaluation is complete.  Work up started.  EKG and HPI concerning for STEMI.  Called code STEMI.  Ordered aspirin and sublingual nitroglycerin along with heparin bolus.     Gareth Eagle, PA-C 07/16/23 1312

## 2023-07-16 NOTE — Progress Notes (Signed)
Date and time results received: 07/16/23 1710  Test: Troponin Critical Value: 881  Name of Provider Notified: Dr. [place]  Orders Received? Or Actions Taken?: expected value

## 2023-07-16 NOTE — ED Provider Notes (Signed)
[place] EMERGENCY DEPARTMENT AT [facility] Provider Note   CSN: [id number] Arrival date & time: 07/16/23  1257     History  Chief Complaint  Patient presents with   Chest Pain   Code STEMI    RANNIE CRANEY is a 68 y.o. male.  68 year old male with prior medical history as detailed below presents for evaluation.  Patient reports that he was playing pickle ball.  He was able to complete the game and win, but presents to the ED with complaint of pain that began during exertion.  Symptoms ongoing for the last hour prior to arrival.  No prior reported CAD or cardiac disease.  Initial EKG performed in triage is concerning for STEMI.  Code STEMI activated.  Patient given aspirin, nitro sublingual, heparin.  Shortly after ED arrival patient was transported to Cath Lab for cardiac intervention  The history is provided by the patient and medical records.       Home Medications Prior to Admission medications   Medication Sig Start Date End Date Taking? Authorizing Provider  atorvastatin (LIPITOR) 40 MG tablet Take 40 mg by mouth daily.    [provider]  Cholecalciferol (VITAMIN D) 2000 UNITS tablet Take 2,000 Units by mouth daily.    [provider]  fexofenadine (ALLEGRA) 30 MG tablet Take 30 mg by mouth daily as needed (for allergies).     [provider]  HUMIRA, 2 PEN, 40 MG/0.4ML pen INJECT 1 PEN UNDER THE SKIN EVERY 14 DAYS. 04/05/23   Pyrtle, Carie Caddy, MD  hyoscyamine (LEVSIN SL) 0.125 MG SL tablet Take 1-2 tablet every 4-6 hours as needed for abdominal pain/spasm 07/19/22   Arnaldo Natal, NP  Vitamin D, Ergocalciferol, (DRISDOL) 1.25 MG (50000 UNIT) CAPS capsule Take 1 capsule (50,000 Units total) by mouth every 7 (seven) days. 07/24/22   Arnaldo Natal, NP      Allergies    Dust mite extract, Grass pollen(k-o-r-t-swt vern), and Molds & smuts    Review of Systems   Review of Systems  All other systems reviewed and  are negative.   Physical Exam Updated Vital Signs BP (!) 149/84   Pulse 74   Temp 98.1 F (36.7 C) (Oral)   Resp (!) 25   Ht 5\' 8"  (1.727 m)   Wt 86.2 kg   SpO2 99%   BMI 28.89 kg/m  Physical Exam Vitals and nursing note reviewed.  Constitutional:      General: He is not in acute distress.    Appearance: He is well-developed.  HENT:     Head: Normocephalic and atraumatic.  Eyes:     Conjunctiva/sclera: Conjunctivae normal.  Cardiovascular:     Rate and Rhythm: Normal rate and regular rhythm.     Heart sounds: No murmur heard. Pulmonary:     Effort: Pulmonary effort is normal. No respiratory distress.     Breath sounds: Normal breath sounds.  Abdominal:     Palpations: Abdomen is soft.     Tenderness: There is no abdominal tenderness.  Musculoskeletal:        General: No swelling.     Cervical back: Neck supple.  Skin:    General: Skin is warm and dry.     Capillary Refill: Capillary refill takes less than 2 seconds.  Neurological:     Mental Status: He is alert.  Psychiatric:        Mood and Affect: Mood normal.     ED Results / Procedures /  Treatments   Labs (all labs ordered are listed, but only abnormal results are displayed) Labs Reviewed  CBC WITH DIFFERENTIAL/PLATELET  HEMOGLOBIN A1C  PROTIME-INR  APTT  COMPREHENSIVE METABOLIC PANEL  LIPID PANEL  I-STAT CG4 LACTIC ACID, ED  TROPONIN I (HIGH SENSITIVITY)    EKG None  Radiology No results found.  Procedures Procedures    Medications Ordered in ED Medications  nitroGLYCERIN (NITROSTAT) SL tablet 0.4 mg (0.4 mg Sublingual Given 07/16/23 1311)  morphine (PF) 2 MG/ML injection 2 mg (has no administration in time range)  aspirin chewable tablet 243 mg (243 mg Oral Given 07/16/23 1311)  heparin injection 4,000 Units (4,000 Units Intravenous Given 07/16/23 1321)    ED Course/ Medical Decision Making/ A&P                                 Medical Decision Making Risk Prescription drug  management.    Medical Screen Complete  This patient presented to the ED with complaint of chest pain.  This complaint involves an extensive number of treatment options. The initial differential diagnosis includes, but is not limited to, STEMI  This presentation is: Acute, Self-Limited, Previously Undiagnosed, Uncertain Prognosis, Complicated, Systemic Symptoms, and Threat to Life/Bodily Function  Patient presents to the ED with approximately 1 hour of chest pain.  Initial EKG is concerning for STEMI.  Code STEMI initiated in triage.  Patient administered aspirin, sublingual nitro, heparin.  Patient taken expeditiously to Cath Lab by cardiology.  Co morbidities that complicated the patient's evaluation  See HPI   Additional history obtained:   External records from outside sources obtained and reviewed including prior ED visits and prior Inpatient records.    Lab Tests:  I ordered and personally interpreted labs.  The pertinent results include: CBC, CMP troponin   Imaging Studies ordered:  I ordered imaging studies including chest x-ray I independently visualized and interpreted obtained imaging which showed NAD I agree with the radiologist interpretation.   Cardiac Monitoring:  The patient was maintained on a cardiac monitor.  I personally viewed and interpreted the cardiac monitor which showed an underlying rhythm of: NSR   Medicines ordered:  I ordered medication including aspirin, sublingual nitroglycerin, heparin for STEMI Reevaluation of the patient after these medicines showed that the patient: improved   Problem List / ED Course:  STEMI   Reevaluation:  After the interventions noted above, I reevaluated the patient and found that they have: improved    Disposition:  After consideration of the diagnostic results and the patients response to treatment, I feel that the patent would benefit from admission.   CRITICAL CARE Performed by: Wynetta Fines   Total critical care time: 30 minutes  Critical care time was exclusive of separately billable procedures and treating other patients.  Critical care was necessary to treat or prevent imminent or life-threatening deterioration.  Critical care was time spent personally by me on the following activities: development of treatment plan with patient and/or surrogate as well as nursing, discussions with consultants, evaluation of patient's response to treatment, examination of patient, obtaining history from patient or surrogate, ordering and performing treatments and interventions, ordering and review of laboratory studies, ordering and review of radiographic studies, pulse oximetry and re-evaluation of patient's condition.          Final Clinical Impression(s) / ED Diagnoses Final diagnoses:  Dyslipidemia  STEMI involving right coronary artery (HCC)  Rx / DC Orders ED Discharge Orders     None         Wynetta Fines, MD 07/16/23 [phone]

## 2023-07-16 NOTE — ED Notes (Signed)
Amanda STEMI RN at bedside.

## 2023-07-17 ENCOUNTER — Inpatient Hospital Stay (HOSPITAL_COMMUNITY): Payer: Medicare Other

## 2023-07-17 ENCOUNTER — Telehealth: Payer: Self-pay | Admitting: Cardiology

## 2023-07-17 ENCOUNTER — Other Ambulatory Visit (HOSPITAL_COMMUNITY): Payer: Self-pay

## 2023-07-17 DIAGNOSIS — I4729 Other ventricular tachycardia: Secondary | ICD-10-CM | POA: Insufficient documentation

## 2023-07-17 DIAGNOSIS — I2111 ST elevation (STEMI) myocardial infarction involving right coronary artery: Secondary | ICD-10-CM | POA: Diagnosis not present

## 2023-07-17 DIAGNOSIS — I5021 Acute systolic (congestive) heart failure: Secondary | ICD-10-CM | POA: Insufficient documentation

## 2023-07-17 LAB — BASIC METABOLIC PANEL
Anion gap: 9 (ref 5–15)
BUN: 15 mg/dL (ref 8–23)
CO2: 22 mmol/L (ref 22–32)
Calcium: 8.6 mg/dL — ABNORMAL LOW (ref 8.9–10.3)
Chloride: 107 mmol/L (ref 98–111)
Creatinine, Ser: 0.89 mg/dL (ref 0.61–1.24)
GFR, Estimated: >60 mL/min (ref >60)
Glucose, Bld: 100 mg/dL — ABNORMAL HIGH (ref 70–99)
Potassium: 3.8 mmol/L (ref 3.5–5.1)
Sodium: 138 mmol/L (ref 135–145)

## 2023-07-17 LAB — ECHOCARDIOGRAM COMPLETE
Area-P 1/2: 3.21 cm2
Calc EF: 52.4 %
Height: 68 in
S' Lateral: 2.3 cm
Single Plane A2C EF: 49.6 %
Single Plane A4C EF: 53.7 %
Weight: 3040 [oz_av]

## 2023-07-17 LAB — CBC
HCT: 42.1 % (ref 39.0–52.0)
Hemoglobin: 14.7 g/dL (ref 13.0–17.0)
MCH: 31.3 pg (ref 26.0–34.0)
MCHC: 34.9 g/dL (ref 30.0–36.0)
MCV: 89.8 fL (ref 80.0–100.0)
Platelets: 179 10*3/uL (ref 150–400)
RBC: 4.69 MIL/uL (ref 4.22–5.81)
RDW: 13.1 % (ref 11.5–15.5)
WBC: 7 10*3/uL (ref 4.0–10.5)
nRBC: 0 % (ref 0.0–0.2)

## 2023-07-17 MED ORDER — PERFLUTREN LIPID MICROSPHERE: 1.0000 mL | INTRAVENOUS | Status: AC | PRN

## 2023-07-17 MED ORDER — NITROGLYCERIN 0.4 MG SL SUBL
0.4000 mg | SUBLINGUAL_TABLET | SUBLINGUAL | 2 refills | Status: DC | PRN
  Filled 2023-07-17: qty 25, 8d supply, fill #0

## 2023-07-17 MED ORDER — MAGNESIUM SULFATE 2 GM/50ML IV SOLN
2.0000 g | Freq: Once | INTRAVENOUS | Status: AC
  Administered 2023-07-17: 2 g via INTRAVENOUS
  Filled 2023-07-17: qty 50

## 2023-07-17 MED ORDER — ATORVASTATIN CALCIUM 80 MG PO TABS
80.0000 mg | ORAL_TABLET | Freq: Every day | ORAL | 1 refills | Status: DC
  Filled 2023-07-17: qty 30, 30d supply, fill #0

## 2023-07-17 MED ORDER — PERFLUTREN LIPID MICROSPHERE
INTRAVENOUS | Status: AC
Start: 2023-07-17 — End: 2023-07-17
  Administered 2023-07-17: 2 mL via INTRAVENOUS
  Filled 2023-07-17: qty 10

## 2023-07-17 MED ORDER — SPIRONOLACTONE 12.5 MG HALF TABLET: 12.5000 mg | ORAL_TABLET | Freq: Every day | ORAL | Status: DC

## 2023-07-17 MED ORDER — PRASUGREL HCL 10 MG PO TABS
10.0000 mg | ORAL_TABLET | Freq: Every day | ORAL | 2 refills | Status: DC
  Filled 2023-07-17: qty 30, 30d supply, fill #0

## 2023-07-17 MED ORDER — SPIRONOLACTONE 25 MG PO TABS
12.5000 mg | ORAL_TABLET | Freq: Every day | ORAL | 1 refills | Status: DC
Start: 2023-07-18 — End: 2023-07-24
  Filled 2023-07-17: qty 30, 60d supply, fill #0

## 2023-07-17 MED ORDER — ASPIRIN 81 MG PO TBEC
81.0000 mg | DELAYED_RELEASE_TABLET | Freq: Every day | ORAL | 2 refills | Status: AC
Start: 2023-07-18 — End: ?
  Filled 2023-07-17: qty 30, 30d supply, fill #0

## 2023-07-17 MED ORDER — CARVEDILOL 6.25 MG PO TABS
6.2500 mg | ORAL_TABLET | Freq: Two times a day (BID) | ORAL | 2 refills | Status: DC
  Filled 2023-07-17: qty 60, 30d supply, fill #0

## 2023-07-17 MED ORDER — LOSARTAN POTASSIUM 25 MG PO TABS
25.0000 mg | ORAL_TABLET | Freq: Every day | ORAL | 1 refills | Status: DC
  Filled 2023-07-17: qty 30, 30d supply, fill #0

## 2023-07-17 MED ORDER — CHLORHEXIDINE GLUCONATE CLOTH 2 % EX PADS
6.0000 | MEDICATED_PAD | Freq: Every day | CUTANEOUS | Status: DC
  Administered 2023-07-17: 6 via TOPICAL

## 2023-07-17 NOTE — Progress Notes (Signed)
  Echocardiogram 2D Echocardiogram has been performed.  Terry Kerr 07/17/2023, 1:53 PM

## 2023-07-17 NOTE — Progress Notes (Signed)
CARDIAC REHAB PHASE I   PRE:  Rate/Rhythm: 59 NS  BP:  Sitting: 146/54      SpO2: 96 RA  MODE:  Ambulation: 370 ft    POST:  Rate/Rhythm: 78 NSR  BP:  Sitting: 127/66      SpO2: 98 RA  Pt ambulated with supervision assistance, pt walked well w/o chest pain and other unusual symptoms. Pt educated with family present.  Pt was educated on STEMI, stent card, stent location, Antiplatelet and ASA use, wt restrictions, no baths/daily wash-ups, s/s of infection, ex guidelines, s/s to stop exercising, NTG use and calling 911, heart healthy diet, risk factors and CRPII. Pt received MI book and materials on exercise, diet, and CRPII. Will refer to [facility].    Faustino Congress  MS, ACSM-CEP 10:53 AM 07/17/2023    Service time is from 1010 to 1050.

## 2023-07-17 NOTE — Telephone Encounter (Signed)
   Transition of Care Follow-up Phone Call Request    Patient Name: Terry Kerr Date of Birth: 02-07-1956 Date of Encounter: 07/17/2023  Primary Care Provider:  Cleatis Polka., MD Primary Cardiologist:  Rollene Rotunda, MD  [name] has been scheduled for a transition of care follow up appointment with a [facility] provider:  Reather Littler 2/25  Please reach out to [name] within 48 hours of discharge to confirm appointment and review transition of care protocol questionnaire. Anticipated discharge date: 2/18  [name], NP  07/17/2023, 2:51 PM

## 2023-07-17 NOTE — Progress Notes (Signed)
Discharge instructions reviewed with pt, his wife and their daughter.  Copy of instructions given to pt. [facility] has filled pt's scripts and will be picked up on the way out for discharge. Daughter has gone to get the car. Pt will be d/c'd via wheelchair with belongings, with family and will be          escorted by staff.   Jamice Carreno,RN SWOT

## 2023-07-17 NOTE — Progress Notes (Signed)
2D echo attempted around 1255, patient was in chair. Will try later

## 2023-07-17 NOTE — Telephone Encounter (Signed)
Noted  Per chart review patient remains admitted  Nursing will continue to monitor admission status and outreach post discharge  Postpone 2/19 for follow up

## 2023-07-17 NOTE — Discharge Instructions (Addendum)
Information about your medication:  Beta Blocker (Heart rate/Blood Pressure-lowering agent)  Generic Name (Brand): carvedilol (Coreg),  PURPOSE: You are taking this medication to lower blood pressure and heart rate to help prevent further damage to your heart and to reduce your risk of death following your cardiac event.   Common SIDE EFFECTS you may experience include: fatigue, dizziness, diarrhea, or nausea. This medication may also mask the signs of hypoglycemia  Take your medication exactly as prescribed.   Contact your health care provider if you experience: severely lower bllod pressure, syncope, palpitations, or chest pain muscle ---------------------------------------------------------------------------------------------------------------------- Information about your medication: Effient (anti-platelet agent)  Generic Name (Brand): prasugrel (Effient), once daily medication  PURPOSE: You are taking this medication along with aspirin to lower your chance of having a heart attack, stroke, or blood clots in your heart stent. These can be fatal. Brilinta and aspirin help prevent platelets from sticking together and forming a clot that can block an artery or your stent.   Common SIDE EFFECTS you may experience include: bruising or bleeding more easily,  Do not stop taking EFFIENT without talking to the doctor who prescribes it for you. People who are treated with a stent and stop taking Effient too soon, have a higher risk of getting a blood clot in the stent, having a heart attack, or dying. If you stop Effient because of bleeding, or for other reasons, your risk of a heart attack or stroke may increase.   Tell all of your doctors and dentists that you are taking Effient. They should talk to the doctor who prescribed Effient for you before you have any surgery or invasive procedure.   Contact your health care provider if you experience: severe or uncontrollable bleeding, pink/red/brown  urine, vomiting blood or vomit that looks like "coffee grounds", red or black stools (looks like tar), coughing up blood or blood clots ---------------------------------------------------------------------------------------------------------------------- Information about your medication: Statin (cholesterol-lowering agent)  Generic Name (Brand): atorvastatin (Lipitor),  PURPOSE: You are taking this medication to lower your "bad" cholesterol (LDL) and to prevent heart attacks and strokes. Statins can also raise your "good" cholesterol (HDL).  Common SIDE EFFECTS you may experience include: muscle pain or weakness (especially in the legs) and upset stomach.  Take your medication exactly as prescribed. Do not eat large amounts of grapefruit or grapefruit juice while taking this medication.  Contact your health care provider if you experience: severe muscle pain that does not improve, dark urine, or yellowing of your skin or eyes. ----------------------------------------------------------------------------------------------------------------------

## 2023-07-17 NOTE — Progress Notes (Signed)
Rounding Note    Patient Name: Terry Kerr Date of Encounter: 07/17/2023  [facility] Cardiologist: Rollene Rotunda, MD   Subjective   Feels very well this am. No chest pain, dyspnea, palpitations.   Inpatient Medications    Scheduled Meds:  aspirin EC  81 mg Oral Daily   atorvastatin  80 mg Oral Daily   carvedilol  6.25 mg Oral BID WC   enoxaparin (LOVENOX) injection  40 mg Subcutaneous Q24H   losartan  25 mg Oral Daily   prasugrel  10 mg Oral Daily   sodium chloride flush  3 mL Intravenous Q12H   Continuous Infusions:  sodium chloride 10 mL/hr at 07/17/23 0500   magnesium sulfate bolus IVPB     PRN Meds: sodium chloride, acetaminophen, nitroGLYCERIN, nitroGLYCERIN, ondansetron (ZOFRAN) IV, mouth rinse, sodium chloride flush   Vital Signs    Vitals:   07/17/23 0400 07/17/23 0500 07/17/23 0600 07/17/23 0740  BP: 129/70 128/75 129/65 124/83  Pulse: (!) 55 65 (!) 55 72  Resp: 20 15 20    Temp:      TempSrc:      SpO2: 94% 95% 93%   Weight:      Height:        Intake/Output Summary (Last 24 hours) at 07/17/2023 0803 Last data filed at 07/17/2023 0500 Gross per 24 hour  Intake 347.78 ml  Output 1600 ml  Net -1252.22 ml      07/16/2023    1:18 PM 06/11/2023    8:59 AM 07/19/2022    9:04 AM  Last 3 Weights  Weight (lbs) 190 lb 190 lb 184 lb  Weight (kg) 86.183 kg 86.183 kg 83.462 kg      Telemetry    NSR with run of accelerated idioventricular rhythm yesterday PM. Some short  runs of NSVT - Personally Reviewed  ECG    NSR with Q waves inferiorly. ST elevation resolved. Poor R wave progression - Personally Reviewed  Physical Exam   GEN: No acute distress.   Neck: No JVD Cardiac: RRR, no murmurs, rubs, or gallops.  Respiratory: Clear to auscultation bilaterally. GI: Soft, nontender, non-distended  MS: No edema; No deformity. Radial site without hematoma Neuro:  Nonfocal  Psych: Normal affect   Labs    High Sensitivity Troponin:    Recent Labs  Lab 07/16/23 1317 07/16/23 1553 07/16/23 1753  TROPONINIHS 76* 881* 2,045*     Chemistry Recent Labs  Lab 07/16/23 1317 07/16/23 1553 07/16/23 1753 07/17/23 0233  NA 139  --   --  138  K 4.3  --   --  3.8  CL 106  --   --  107  CO2 25  --   --  22  GLUCOSE 107*  --   --  100*  BUN 18  --   --  15  CREATININE 1.13 0.94  --  0.89  CALCIUM 9.2  --   --  8.6*  MG  --   --  1.8  --   PROT 6.8  --   --   --   ALBUMIN 3.9  --   --   --   AST 21  --   --   --   ALT 16  --   --   --   ALKPHOS 50  --   --   --   BILITOT 0.8  --   --   --   GFRNONAA >60 >60  --  >60  ANIONGAP 8  --   --  9    Lipids  Recent Labs  Lab 07/16/23 1309  CHOL 289*  TRIG 172*  HDL 49  LDLCALC 206*  CHOLHDL 5.9    Hematology Recent Labs  Lab 07/16/23 1317 07/16/23 1553 07/17/23 0233  WBC 7.4 10.3 7.0  RBC 5.08 4.82 4.69  HGB 15.9 15.0 14.7  HCT 46.2 42.7 42.1  MCV 90.9 88.6 89.8  MCH 31.3 31.1 31.3  MCHC 34.4 35.1 34.9  RDW 12.9 13.0 13.1  PLT 196 189 179   Thyroid No results for input(s): "TSH", "FREET4" in the last 168 hours.  BNPNo results for input(s): "BNP", "PROBNP" in the last 168 hours.  DDimer No results for input(s): "DDIMER" in the last 168 hours.   Radiology    DG Chest Port 1 View Result Date: 07/16/2023 CLINICAL DATA:  Chest pain. EXAM: PORTABLE CHEST 1 VIEW COMPARISON:  None Available. FINDINGS: The heart size and mediastinal contours are within normal limits. Both lungs are clear. The visualized skeletal structures are unremarkable. IMPRESSION: No active disease. Electronically Signed   By: Elgie Collard M.D.   On: 07/16/2023 17:58   CARDIAC CATHETERIZATION Result Date: 07/16/2023 2 vessel obstructive CAD. 90% mid LAD, 80% large PL branch of the RCA Mild LV dysfunction with apical dyskinesis Mildly elevated LVEDP 19 mm Hg Successful PCI of the mid LAD with DES Successful PCI of the PL branch of the RCA with DES Plan: check Echo. DAPT for one year.  Possible DC tomorrow if no complications.    Cardiac Studies   Coronary/Graft Acute MI Revascularization  LEFT HEART CATH AND CORONARY ANGIOGRAPHY   Conclusion  2 vessel obstructive CAD. 90% mid LAD, 80% large PL branch of the RCA Mild LV dysfunction with apical dyskinesis Mildly elevated LVEDP 19 mm Hg Successful PCI of the mid LAD with DES Successful PCI of the PL branch of the RCA with DES   Plan: check Echo. DAPT for one year. Possible DC tomorrow if no complications.   Coronary Diagrams  Diagnostic Dominance: Right  Intervention        68 y.o. male with history of HLD presented with acute inferior STEMI  Assessment & Plan    Acute inferior STEMI. Patient had severe 2 vessel CAD. I suspect culprit vessel was the mid LAD which wraps around the apex. Both lesions stented successfully with DES. Troponin peak 2000. Ecg is much improved today. Echo is pending. He is asymptomatic. Some arrhythmia noted on monitor. Continue DAPT with ASA and Prasugrel for one year. High dose statin. Started losartan and Coreg. If EF down on Echo will start aldactone. Discussed heart healthy diet. Discussed activity progression and recommend outpatient cardiac Rehab. Will ambulate in hall today. If no further complications could be DC later today Hypercholesterolemia. Has not been compliant with lipid lowering therapy. LDL 206 with bad family history suggests he has familial hypercholesterolemia. Will start lipitor 80 mg daily. Refer to lipid clinic in follow up. Goal LDL < 55 NSVT post reperfusion. No symptoms. Replete magnesium.   For questions or updates, please contact [facility] Please consult [email or web address] for contact info under        Signed, Ellizabeth Dacruz [place], MD  07/17/2023, 8:03 AM

## 2023-07-17 NOTE — Discharge Summary (Signed)
Discharge Summary    Patient ID: Terry Kerr MRN: [id number]; DOB: [birth year]  Admit date: 07/16/2023 Discharge date: 07/17/2023  PCP:  Cleatis Polka., MD   [facility] Providers Cardiologist:  Rollene Rotunda, MD     Discharge Diagnoses    Principal Problem:   STEMI involving right coronary artery [facility]) Active Problems:   HYPERCHOLESTEROLEMIA   NSVT (nonsustained ventricular tachycardia) (HCC)   Acute heart failure with mildly reduced ejection fraction (HFmrEF, 41-49%) [facility])  Diagnostic Studies/Procedures    Cath: 07/16/2023  2 vessel obstructive CAD. 90% mid LAD, 80% large PL branch of the RCA Mild LV dysfunction with apical dyskinesis Mildly elevated LVEDP 19 mm Hg Successful PCI of the mid LAD with DES Successful PCI of the PL branch of the RCA with DES   Plan: check Echo. DAPT for one year. Possible DC tomorrow if no complications.   Diagnostic Dominance: Right  Intervention   _____________   History of Present Illness     Terry Kerr is a 68 y.o. male with HLD, crohn's disease s/p colon resection '16  who was seen 07/16/2023 for the evaluation of chest pain/STEMI. He was seen by Dr. Antoine Poche 2020 for evaluation of shoulder pain/dyspnea. Referred to coronary calcium score of 23, 39th percentile and exercise treadmill test.    Reported he had been in his usual state of health until the day of admission. He was playing pickle ball on the court and developed centralized chest pain with radiation into the left arm. Also with diaphoresis. Was able to finish the game but continued to have pain. He was brought to the ED by his neighbor, and noted to be diaphoretic in triage. EKG showed sinus rhythm, 71bpm with inferolateral ST elevation. CODE STEMI called in the ED. Given 4000 units of heparin, ASA, morphine and 1 SL NTG. Brought directly to the cath lab for emergent cardiac cath.   Hospital Course     Inferior STEMI -- Underwent cardiac  catheterization noted above with two-vessel CAD, 90% mid LAD treated with PCI/DES x 1, 80% large PL branch of RCA treated with PCI/DES x 1.  Mildly elevated LVEDP of 19 mmHg.  Recommendations for DAPT with aspirin/Effient for at least 1 year.  No recurrent chest pain.  Ambulated with cardiac rehab without difficulty. -- Continue aspirin, Effient, atorvastatin 80 mg daily, Coreg 6.25 mg twice daily, losartan 25 mg daily  HFmrEF -- Echocardiogram pending official read but preliminary review by MD with LVEF of 45%, apical hypokinesis. -- GDMT: Coreg 6.25 mg twice daily, losartan 25 mg daily, spironolactone 12.5 mg daily  HLD -- LDL 206, HDL 49, LP(a) pending -- Continue atorvastatin 80 mg daily -- Refer to lipid clinic  NSVT -- Noted post reperfusion, asymptomatic.  Supplemented magnesium -- Placed on Coreg 6.25 mg twice daily, no recurrent episodes  Patient was seen by Dr. [place] and deemed stable for discharge home.  Follow-up arranged in the office.  Medication sent to [facility] pharmacy.  Educated by [employer].D. prior to discharge.  Did the patient have an acute coronary syndrome (MI, NSTEMI, STEMI, etc) this admission?:  Yes                               AHA/ACC ACS Clinical Performance & Quality Measures: Aspirin prescribed? - Yes ADP Receptor Inhibitor (Plavix/Clopidogrel, Brilinta/Ticagrelor or Effient/Prasugrel) prescribed (includes medically managed patients)? - Yes Beta Blocker prescribed? - Yes High Intensity  Statin (Lipitor 40-80mg  or Crestor 20-40mg ) prescribed? - Yes EF assessed during THIS hospitalization? - Yes For EF <40%, was ACEI/ARB prescribed? - Yes For EF <40%, Aldosterone Antagonist (Spironolactone or Eplerenone) prescribed? - Yes Cardiac Rehab Phase II ordered (including medically managed patients)? - Yes   The patient will be scheduled for a TOC follow up appointment in 10-14 days.  A message has been sent to the [facility] and Scheduling Pool at the office where the patient  should be seen for follow up.  _____________  Discharge Vitals Blood pressure 123/73, pulse 65, temperature 98.5 F (36.9 C), temperature source Oral, resp. rate (!) 26, height 5\' 8"  (1.727 m), weight 86.2 kg, SpO2 93%.  Filed Weights   07/16/23 1318  Weight: 86.2 kg    Labs & Radiologic Studies    CBC Recent Labs    07/16/23 1317 07/16/23 1553 07/17/23 0233  WBC 7.4 10.3 7.0  NEUTROABS 5.6  --   --   HGB 15.9 15.0 14.7  HCT 46.2 42.7 42.1  MCV 90.9 88.6 89.8  PLT 196 189 179   Basic Metabolic Panel Recent Labs    16/10/96 1317 07/16/23 1553 07/16/23 1753 07/17/23 0233  NA 139  --   --  138  K 4.3  --   --  3.8  CL 106  --   --  107  CO2 25  --   --  22  GLUCOSE 107*  --   --  100*  BUN 18  --   --  15  CREATININE 1.13 0.94  --  0.89  CALCIUM 9.2  --   --  8.6*  MG  --   --  1.8  --    Liver Function Tests Recent Labs    07/16/23 1317  AST 21  ALT 16  ALKPHOS 50  BILITOT 0.8  PROT 6.8  ALBUMIN 3.9   No results for input(s): "LIPASE", "AMYLASE" in the last 72 hours. High Sensitivity Troponin:   Recent Labs  Lab 07/16/23 1317 07/16/23 1553 07/16/23 1753  TROPONINIHS 76* 881* 2,045*    BNP Invalid input(s): "POCBNP" D-Dimer No results for input(s): "DDIMER" in the last 72 hours. Hemoglobin A1C Recent Labs    07/16/23 1309  HGBA1C 5.0   Fasting Lipid Panel Recent Labs    07/16/23 1309  CHOL 289*  HDL 49  LDLCALC 206*  TRIG 172*  CHOLHDL 5.9   Thyroid Function Tests No results for input(s): "TSH", "T4TOTAL", "T3FREE", "THYROIDAB" in the last 72 hours.  Invalid input(s): "FREET3" _____________  DG Chest Port 1 View Result Date: 07/16/2023 CLINICAL DATA:  Chest pain. EXAM: PORTABLE CHEST 1 VIEW COMPARISON:  None Available. FINDINGS: The heart size and mediastinal contours are within normal limits. Both lungs are clear. The visualized skeletal structures are unremarkable. IMPRESSION: No active disease. Electronically Signed   By: Elgie Collard M.D.   On: 07/16/2023 17:58   CARDIAC CATHETERIZATION Result Date: 07/16/2023 2 vessel obstructive CAD. 90% mid LAD, 80% large PL branch of the RCA Mild LV dysfunction with apical dyskinesis Mildly elevated LVEDP 19 mm Hg Successful PCI of the mid LAD with DES Successful PCI of the PL branch of the RCA with DES Plan: check Echo. DAPT for one year. Possible DC tomorrow if no complications.   Disposition   Pt is being discharged home today in good condition.  Follow-up Plans & Appointments     Discharge Instructions     AMB Referral to [facility]  Complete by: As directed    Malena Peer   Reason For Referral: Lipids   Amb Referral to Cardiac Rehabilitation   Complete by: As directed    Diagnosis:  Coronary Stents STEMI     After initial evaluation and assessments completed: Virtual Based Care may be provided alone or in conjunction with Phase 2 Cardiac Rehab based on patient barriers.: Yes   Intensive Cardiac Rehabilitation (ICR) [facility] location only OR Traditional Cardiac Rehabilitation (TCR) *If criteria for ICR are not met will enroll in TCR [facility] only): Yes   Call MD for:  difficulty breathing, headache or visual disturbances   Complete by: As directed    Call MD for:  persistant dizziness or light-headedness   Complete by: As directed    Call MD for:  redness, tenderness, or signs of infection (pain, swelling, redness, odor or green/yellow discharge around incision site)   Complete by: As directed    Diet - low sodium heart healthy   Complete by: As directed    Discharge instructions   Complete by: As directed    Radial Site Care Refer to this sheet in the next few weeks. These instructions provide you with information on caring for yourself after your procedure. Your caregiver may also give you more specific instructions. Your treatment has been planned according to current medical practices, but problems sometimes occur. Call your caregiver if you have any  problems or questions after your procedure. HOME CARE INSTRUCTIONS You may shower the day after the procedure. Remove the bandage (dressing) and gently wash the site with plain soap and water. Gently pat the site dry.  Do not apply powder or lotion to the site.  Do not submerge the affected site in water for 3 to 5 days.  Inspect the site at least twice daily.  Do not flex or bend the affected arm for 24 hours.  No lifting over 5 pounds (2.3 kg) for 5 days after your procedure.  Do not drive home if you are discharged the same day of the procedure. Have someone else drive you.  You may drive 24 hours after the procedure unless otherwise instructed by your caregiver.  What to expect: Any bruising will usually fade within 1 to 2 weeks.  Blood that collects in the tissue (hematoma) may be painful to the touch. It should usually decrease in size and tenderness within 1 to 2 weeks.  SEEK IMMEDIATE MEDICAL CARE IF: You have unusual pain at the radial site.  You have redness, warmth, swelling, or pain at the radial site.  You have drainage (other than a small amount of blood on the dressing).  You have chills.  You have a fever or persistent symptoms for more than 72 hours.  You have a fever and your symptoms suddenly get worse.  Your arm becomes pale, cool, tingly, or numb.  You have heavy bleeding from the site. Hold pressure on the site.   PLEASE DO NOT MISS ANY DOSES OF YOUR EFFIENT!!!!! Also keep a log of you blood pressures and bring back to your follow up appt. Please call the office with any questions.   Patients taking blood thinners should generally stay away from medicines like ibuprofen, Advil, Motrin, naproxen, and Aleve due to risk of stomach bleeding. You may take Tylenol as directed or talk to your primary doctor about alternatives.    PLEASE ENSURE THAT YOU DO NOT RUN OUT OF YOUR EFFIENT. This medication is very important to remain on for at  least one year. IF you have issues  obtaining this medication due to cost please CALL the office 3-5 business days prior to running out in order to prevent missing doses of this medication.   Increase activity slowly   Complete by: As directed         Discharge Medications   Allergies as of 07/17/2023       Reactions   Dust Mite Extract Other (See Comments)   Grass Pollen(k-o-r-t-swt Vern) Other (See Comments)   Molds & Smuts Other (See Comments)        Medication List     TAKE these medications    aspirin EC 81 MG tablet Take 1 tablet (81 mg total) by mouth daily. Swallow whole. Start taking on: July 18, 2023 What changed:  when to take this reasons to take this   atorvastatin 80 MG tablet Commonly known as: LIPITOR Take 1 tablet (80 mg total) by mouth daily. Start taking on: July 18, 2023 What changed:  medication strength how much to take   carvedilol 6.25 MG tablet Commonly known as: COREG Take 1 tablet (6.25 mg total) by mouth 2 (two) times daily with a meal.   fexofenadine 30 MG tablet Commonly known as: ALLEGRA Take 30 mg by mouth daily as needed (for allergies).   Humira (2 Pen) 40 MG/0.4ML pen Generic drug: adalimumab INJECT 1 PEN UNDER THE SKIN EVERY 14 DAYS.   hyoscyamine 0.125 MG SL tablet Commonly known as: LEVSIN SL Take 1-2 tablet every 4-6 hours as needed for abdominal pain/spasm   losartan 25 MG tablet Commonly known as: COZAAR Take 1 tablet (25 mg total) by mouth daily. Start taking on: July 18, 2023   nitroGLYCERIN 0.4 MG SL tablet Commonly known as: NITROSTAT Place 1 tablet (0.4 mg total) under the tongue every 5 (five) minutes as needed for chest pain (Do not give more than 3 SL tablets in 15 minutes.).   prasugrel 10 MG Tabs tablet Commonly known as: EFFIENT Take 1 tablet (10 mg total) by mouth daily. Start taking on: July 18, 2023   spironolactone 25 MG tablet Commonly known as: ALDACTONE Take 0.5 tablets (12.5 mg total) by mouth daily. Start  taking on: July 18, 2023   Vitamin D 50 MCG (2000 UT) tablet Take 2,000 Units by mouth daily.         Outstanding Labs/Studies   LFT/FLP in 8 weeks BMET at follow-up  Duration of Discharge Encounter: APP Time: 20 minutes   Signed, [name], NP 07/17/2023, 2:58 PM

## 2023-07-17 NOTE — TOC Initial Note (Signed)
Transition of Care [facility]) - Initial/Assessment Note    Patient Details  Name: Terry Kerr MRN: [id number] Date of Birth: [birth year]  Transition of Care [facility]) CM/SW Contact:    Elliot Cousin, RN Phone Number: [phone] 07/17/2023, 3:59 PM  Clinical Narrative:                 TOC CM spoke to pt and wife at bedside. Attempted 2x to schedule PCP follow up appt. Left message with Tresa Endo. Pt states he will give office a call to arrange appt. Pt independent pta and wife/dtr at home to assist with care.   Expected Discharge Plan: Home/Self Care Barriers to Discharge: No Barriers Identified   Patient Goals and CMS Choice            Expected Discharge Plan and Services   Discharge Planning Services: CM Consult   Living arrangements for the past 2 months: Single Family Home Expected Discharge Date: 07/17/23                                    Prior Living Arrangements/Services Living arrangements for the past 2 months: Single Family Home Lives with:: Spouse Patient language and need for interpreter reviewed:: Yes Do you feel safe going back to the place where you live?: Yes      Need for Family Participation in Patient Care: No (Comment) Care giver support system in place?: No (comment)   Criminal Activity/Legal Involvement Pertinent to Current Situation/Hospitalization: No - Comment as needed  Activities of Daily Living   ADL Screening (condition at time of admission) Independently performs ADLs?: Yes (appropriate for developmental age) Is the patient deaf or have difficulty hearing?: No Does the patient have difficulty seeing, even when wearing glasses/contacts?: No Does the patient have difficulty concentrating, remembering, or making decisions?: No  Permission Sought/Granted Permission sought to share information with : Case Manager, Family Supports, PCP Permission granted to share information with : Yes, Verbal Permission Granted  Share Information  with NAME: Yaqub Arney     Permission granted to share info w Relationship: wife  Permission granted to share info w Contact Information: [phone]  Emotional Assessment Appearance:: Appears stated age Attitude/Demeanor/Rapport: Engaged Affect (typically observed): Accepting Orientation: : Oriented to Self, Oriented to Place, Oriented to  Time, Oriented to Situation   Psych Involvement: No (comment)  Admission diagnosis:  STEMI involving right coronary artery (HCC) [I21.11] Patient Active Problem List   Diagnosis Date Noted   NSVT (nonsustained ventricular tachycardia) (HCC) 07/17/2023   Acute heart failure with mildly reduced ejection fraction (HFmrEF, 41-49%) (HCC) 07/17/2023   STEMI involving right coronary artery (HCC) 07/16/2023   Dyslipidemia 03/03/2019   Family history of early CAD 03/03/2019   Abdominal pain    S/P small bowel resection    SBO (small bowel obstruction) (HCC) 09/29/2014   Diverticulosis 12/04/2012   Hemorrhoids, internal, thrombosed s/p resection/pexy 12/06/2012 12/04/2012   HERPES SIMPLEX INFECTION 08/01/2007   HYPERCHOLESTEROLEMIA 08/01/2007   Regional enteritis (Crohn's disease) (HCC) 08/01/2007   PCP:  Cleatis Polka., MD Pharmacy:   [facility] [phone] [place], [place] - [address] AT [facility] OF [address] & [facility] [address] [place] [address] Phone: [phone] Fax: [phone]  [facility] [address] [place] [address] Phone: [phone] Fax: [phone]     Social Drivers of Health (SDOH) Social  History: SDOH Screenings   Food Insecurity: No Food Insecurity (07/16/2023)  Housing: Low Risk  (07/16/2023)  Transportation Needs: No Transportation Needs (07/16/2023)  Utilities: Not At Risk (07/16/2023)  Social Connections: Socially Integrated (07/16/2023)  Tobacco Use: Low Risk  (07/16/2023)   SDOH Interventions:     Readmission Risk Interventions     No  data to display

## 2023-07-18 NOTE — Telephone Encounter (Signed)
Patient identification verified by 2 forms. Terry Rail, RN    Patient contacted regarding discharge from [facility] on 07/17/23.  Patient understands to follow up with provider NP West on 2/25 at 1:55pm at [address]  Patient understands discharge instructions? Yes  Patient understands medications and regiment? Yes  Patient understands to bring all medications to this visit? Yes

## 2023-07-19 ENCOUNTER — Encounter: Payer: Self-pay | Admitting: *Deleted

## 2023-07-19 LAB — LIPOPROTEIN A (LPA): Lipoprotein (a): 64.6 nmol/L — ABNORMAL HIGH (ref <75.0)

## 2023-07-20 ENCOUNTER — Telehealth (HOSPITAL_COMMUNITY): Payer: Self-pay

## 2023-07-20 NOTE — Telephone Encounter (Signed)
 Called patient to see if he is interested in the Cardiac Rehab Program. Patient expressed interest. Explained scheduling process and went over insurance, patient verbalized understanding. Will contact patient for scheduling once f/u has been completed.

## 2023-07-20 NOTE — Telephone Encounter (Signed)
Pt insurance is active and benefits verified through Medicare A/B. Co-pay $0.00, DED $257.00/$257.00 met, out of pocket $0.00/$0.00 met, co-insurance 20%. No pre-authorization required. Passport, 07/20/23 @ 2:46PM, REF#[id number]   How many CR sessions are covered? (36 visits for TCR, 72 visits for ICR)72 Is this a lifetime maximum or an annual maximum? Lifetime Has the member used any of these services to date? No Is there a time limit (weeks/months) on start of program and/or program completion? No   2ndary insurance is active and benefits verified through [employer]. Co-pay $35.00, DED $0.00/$0.00 met, out of pocket $7,500.00/$80.00 met, co-insurance 0%. No pre-authorization required. Passport, 07/20/23 2:49PM, REF#[id number]      Will contact patient to see if he is interested in the Cardiac Rehab Program. If interested, patient will need to complete follow up appt. Once completed, patient will be contacted for scheduling upon review by the RN Navigator.

## 2023-07-22 MED FILL — Verapamil HCl IV Soln 2.5 MG/ML: INTRAVENOUS | Qty: 2 | Status: AC

## 2023-07-22 NOTE — Progress Notes (Unsigned)
 Cardiology Office Note    Date:  07/24/2023  ID:  Terry Kerr, Terry Kerr [birth year], MRN [id number] PCP:  Terry Kerr., MD  Cardiologist:  Terry Rotunda, MD  Electrophysiologist:  None   Chief Complaint: Hospital follow up for CAD   History of Present Illness: Terry Kerr is a 68 y.o. male with visit-pertinent history of CAD s/p STEMI on 07/16/2023, hyperlipidemia, Crohn's disease s/p colon resection in 2016.  First seen by Dr. Antoine Kerr in 2020 for evaluation of shoulder pain/dyspnea, referred to coronary calcium score of 23, 39th percentile.  On 07/16/2023 he presented to the emergency room for chest pain.  He had been in his usual state of health until the day of admission.  He was playing pickle ball and developed centralized chest pain with radiation into the left arm with associated diaphoresis.  He was able to finish the game but continued to have pain.  He was brought to the emergency room by his neighbor.  EKG showed sinus rhythm, 71 bpm with inferolateral ST elevation.  Code STEMI was called.  Cardiac catheterization indicated two-vessel CAD, 90% mid LAD treated PCI/DES x 1, 80% large PL branch of RCA treated with PCI/DES x 1.  Mildly elevated LVEDP of 19 mmHg.  Was recommended that patient have DAPT with aspirin and Effient for at least 1 year.  Echocardiogram on 07/17/2023 indicated LVEF of 50 to 55%, LV with low normal function, LV demonstrates some regional wall motion abnormalities, mild concentric LVH, diastolic parameters were normal, apical hypokinesis without apical thrombus.  Right ventricular systolic function was normal.  There were no significant valvular abnormalities.  Patient was discharged on 07/17/2023 in stable condition on aspirin, Effient, atorvastatin, carvedilol, losartan and spironolactone.  Today he presents for follow-up with his wife.  He reports that he is doing very well overall.  He denies any recurrent chest pain, shortness of breath, lower  extremity Dema, orthopnea or PND.  He notes that his right radial site is healing well, he denies any pain or numbness and tingling to the area.  Patient notes that he has started back walking and is tolerating well, he is eager to get back onto the pickleball court.  Patient does note that this morning he noted a large raised area that suddenly appeared along the posterior lateral aspect of the left forearm.  He denies pain to the area. ROS: .   Today he denies chest pain, shortness of breath, lower extremity edema, fatigue, palpitations, melena, hematuria, hemoptysis, diaphoresis, weakness, presyncope, syncope, orthopnea, and PND.  All other systems are reviewed and otherwise negative. Studies Reviewed: Marland Kitchen    EKG:  EKG is ordered today, personally reviewed, demonstrating  EKG Interpretation Date/Time:  Tuesday July 24 2023 14:03:54 EST Ventricular Rate:  57 PR Interval:  156 QRS Duration:  76 QT Interval:  412 QTC Calculation: 401 R Axis:   21  Text Interpretation: Sinus bradycardia When compared with ECG of 17-Jul-2023 06:46, Criteria for Inferior infarct are no longer Present U waves present in Lateral leads Confirmed by Terry Kerr [phone]) on 07/24/2023 6:12:21 PM   CV Studies:  Cardiac Studies & Procedures   ______________________________________________________________________________________________ CARDIAC CATHETERIZATION  CARDIAC CATHETERIZATION 07/16/2023  Narrative 2 vessel obstructive CAD. 90% mid LAD, 80% large PL branch of the RCA Mild LV dysfunction with apical dyskinesis Mildly elevated LVEDP 19 mm Hg Successful PCI of the mid LAD with DES Successful PCI of the PL branch of the RCA with  DES  Plan: check Echo. DAPT for one year. Possible DC tomorrow if no complications.  Findings Coronary Findings Diagnostic  Dominance: Right  Left Anterior Descending Mid LAD lesion is 90% stenosed. The lesion is ulcerative.  First Diagonal Branch Vessel is small in  size.  Lateral First Diagonal Branch Vessel is small in size. Lat 1st Diag lesion is 90% stenosed.  Right Coronary Artery  Right Posterior Descending Artery Vessel is small in size.  Right Posterior Atrioventricular Artery RPAV lesion is 80% stenosed.  Third Right Posterolateral Branch Vessel is moderate in size.  Intervention  Mid LAD lesion Stent CATH LAUNCHER 6FR EBU3.5 guide catheter was inserted. Lesion crossed with guidewire using a WIRE ASAHI PROWATER 180CM. Pre-stent angioplasty was performed using a BALLN EMERGE MR 2.5X12. A drug-eluting stent was successfully placed using a SYNERGY XD 3.0X12. Stent strut is well apposed. Post-stent angioplasty was performed using a BALL SAPPHIRE NC24 3.5X8. Maximum pressure:  14 atm. Post-Intervention Lesion Assessment The intervention was successful. Pre-interventional TIMI flow is 3. Post-intervention TIMI flow is 3. No complications occurred at this lesion. There is a 0% residual stenosis post intervention.  RPAV lesion Stent CATH VISTA GUIDE 6FR JR4 guide catheter was inserted. Lesion crossed with guidewire using a WIRE ASAHI PROWATER 180CM. Pre-stent angioplasty was performed using a BALLN EMERGE MR 2.0X12. A drug-eluting stent was successfully placed using a SYNERGY XD 2.50X16. Stent strut is well apposed. Post-stent angioplasty was performed using a BALL SAPPHIRE NC24 2.75X10. Maximum pressure:  16 atm. Post-Intervention Lesion Assessment The intervention was successful. Pre-interventional TIMI flow is 3. Post-intervention TIMI flow is 3. No complications occurred at this lesion. There is a 0% residual stenosis post intervention.   STRESS TESTS  EXERCISE TOLERANCE TEST (ETT) 04/11/2019  Narrative  Blood pressure demonstrated a normal response to exercise.  There was no ST segment deviation noted during stress.  Normal ETT Normal hemodynamic response No significant arrhythmia   ECHOCARDIOGRAM  ECHOCARDIOGRAM COMPLETE  07/17/2023  Narrative ECHOCARDIOGRAM REPORT    Patient Name:   Terry Kerr Date of Exam: 07/17/2023 Medical Rec #:  [id number]       Height:       68.0 in Accession #:    [id number]      Weight:       190.0 lb Date of Birth:  1955/10/30       BSA:          2.000 m Patient Age:    22 years        BP:           127/81 mmHg Patient Gender: M               HR:           60 bpm. Exam Location:  Inpatient  Procedure: 2D Echo, Cardiac Doppler, Color Doppler and Intracardiac Opacification Agent (Both Spectral and Color Flow Doppler were utilized during procedure).  Indications:    122-I22.9 Subsequent ST elevation (STEM) and non-ST elevation (NSTEMI) myocardial infarction  History:        Patient has no prior history of Echocardiogram examinations. Acute MI and CAD; Risk Factors:Dyslipidemia.  Sonographer:    Sheralyn Boatman RDCS Referring Phys: [phone] LINDSAY B ROBERTS  IMPRESSIONS   1. Left ventricular ejection fraction, by estimation, is 50 to 55%. The left ventricle has low normal function. The left ventricle demonstrates regional wall motion abnormalities (see scoring diagram/findings for description). There is mild concentric left ventricular hypertrophy. Left ventricular diastolic  parameters were normal. Apical hypokinesis without apical thrombus. 2. Right ventricular systolic function is normal. The right ventricular size is normal. 3. The mitral valve is normal in structure. No evidence of mitral valve regurgitation. No evidence of mitral stenosis. 4. The aortic valve is tricuspid. Aortic valve regurgitation is not visualized. No aortic stenosis is present. 5. The inferior vena cava is normal in size with <50% respiratory variability, suggesting right atrial pressure of 8 mmHg. 6. Echodensity in the liver consistent with hepatic cyst.  Comparison(s): No prior Echocardiogram.  Conclusion(s)/Recommendation(s): Consider either correlation to prior abdominal imaging or dedicated liver  ultrasound.  FINDINGS Left Ventricle: Left ventricular ejection fraction, by estimation, is 50 to 55%. The left ventricle has low normal function. The left ventricle demonstrates regional wall motion abnormalities. Definity contrast agent was given IV to delineate the left ventricular endocardial borders. Strain imaging was not performed. The left ventricular internal cavity size was normal in size. There is mild concentric left ventricular hypertrophy. Left ventricular diastolic parameters were normal.   LV Wall Scoring: The apical lateral segment, apical septal segment, apical anterior segment, and apical inferior segment are hypokinetic. The anterior wall, antero-lateral wall, anterior septum, inferior wall, posterior wall, mid inferoseptal segment, and basal inferoseptal segment are normal. Apical hypokinesis without apical thrombus.  Right Ventricle: The right ventricular size is normal. No increase in right ventricular wall thickness. Right ventricular systolic function is normal.  Left Atrium: Left atrial size was normal in size.  Right Atrium: Right atrial size was normal in size.  Pericardium: There is no evidence of pericardial effusion.  Mitral Valve: The mitral valve is normal in structure. No evidence of mitral valve regurgitation. No evidence of mitral valve stenosis.  Tricuspid Valve: The tricuspid valve is normal in structure. Tricuspid valve regurgitation is not demonstrated. No evidence of tricuspid stenosis.  Aortic Valve: The aortic valve is tricuspid. Aortic valve regurgitation is not visualized. No aortic stenosis is present.  Pulmonic Valve: The pulmonic valve was normal in structure. Pulmonic valve regurgitation is trivial. No evidence of pulmonic stenosis.  Aorta: The aortic root and ascending aorta are structurally normal, with no evidence of dilitation.  Venous: The inferior vena cava is normal in size with less than 50% respiratory variability, suggesting  right atrial pressure of 8 mmHg.  IAS/Shunts: The atrial septum is grossly normal.  Additional Comments: 3D imaging was not performed.   LEFT VENTRICLE PLAX 2D LVIDd:         3.80 cm     Diastology LVIDs:         2.30 cm     LV e' medial:    8.27 cm/s LV PW:         1.30 cm     LV E/e' medial:  9.4 LV IVS:        1.10 cm     LV e' lateral:   8.81 cm/s LVOT diam:     2.20 cm     LV E/e' lateral: 8.8 LV SV:         77 LV SV Index:   38 LVOT Area:     3.80 cm  LV Volumes (MOD) LV vol d, MOD A2C: 78.6 ml LV vol d, MOD A4C: 83.5 ml LV vol s, MOD A2C: 39.6 ml LV vol s, MOD A4C: 38.7 ml LV SV MOD A2C:     39.0 ml LV SV MOD A4C:     83.5 ml LV SV MOD BP:  43.3 ml  RIGHT VENTRICLE             IVC RV S prime:     15.70 cm/s  IVC diam: 2.10 cm TAPSE (M-mode): 1.7 cm  LEFT ATRIUM             Index        RIGHT ATRIUM           Index LA Vol (A2C):   29.1 ml 14.55 ml/m  RA Area:     11.30 cm LA Vol (A4C):   29.3 ml 14.65 ml/m  RA Volume:   20.80 ml  10.40 ml/m LA Biplane Vol: 29.8 ml 14.90 ml/m AORTIC VALVE LVOT Vmax:   92.50 cm/s LVOT Vmean:  61.900 cm/s LVOT VTI:    0.202 m  AORTA Ao Root diam: 3.40 cm Ao Asc diam:  3.40 cm  MITRAL VALVE MV Area (PHT): 3.21 cm    SHUNTS MV Decel Time: 236 msec    Systemic VTI:  0.20 m MV E velocity: 77.50 cm/s  Systemic Diam: 2.20 cm MV A velocity: 65.20 cm/s MV E/A ratio:  1.19  Riley Lam MD Electronically signed by Riley Lam MD Signature Date/Time: 07/17/2023/5:15:25 PM    Final      CT SCANS  CT CARDIAC SCORING (SELF PAY ONLY) 03/14/2019  Addendum 03/14/2019  2:46 PM ADDENDUM REPORT: 03/14/2019 14:43  CLINICAL DATA:  Risk stratification  EXAM: Coronary Calcium Score  TECHNIQUE: The patient was scanned on a [employer] scanner. Axial non-contrast 3 mm slices were carried out through the heart. The data set was analyzed on a dedicated work station and scored using the Agatson  method.  FINDINGS: Non-cardiac: See separate report from [facility].  Ascending Aorta: Normal Caliber.  Scattered calcifications.  Pericardium: Normal  Coronary arteries: Normal coronary origins. Coronary calcifications in the RCA and LAD.  IMPRESSION: Coronary calcium score of 23. This was 39th percentile for age and sex matched control.  Armanda Magic   Electronically Signed By: Armanda Magic On: 03/14/2019 14:43  Narrative EXAM: OVER-READ INTERPRETATION  CT CHEST  The following report is an over-read performed by radiologist Dr. Charlett Nose of [facility] on 03/14/2019. This over-read does not include interpretation of cardiac or coronary anatomy or pathology. The coronary calcium score interpretation by the cardiologist is attached.  COMPARISON:  None.  FINDINGS: Vascular: Scattered aortic calcifications. No evidence of aortic aneurysm. Heart is normal size.  Mediastinum/Nodes: No adenopathy in the lower mediastinum or hila.  Lungs/Pleura: Visualized lungs clear.  No effusions.  Upper Abdomen: Scattered low-density lesions in the visualized liver are stable since prior abdominal CT and most compatible with cysts. No acute findings.  Musculoskeletal: Chest wall soft tissues are unremarkable. No acute bony abnormality.  IMPRESSION: No acute extra cardiac abnormality.  Scattered aortic atherosclerosis.  Stable hepatic cysts.  Electronically Signed: By: Charlett Nose M.D. On: 03/14/2019 10:32     ______________________________________________________________________________________________        Current Reported Medications:.    Current Meds  Medication Sig   aspirin EC 81 MG tablet Take 1 tablet (81 mg total) by mouth daily. Swallow whole.   Cholecalciferol (VITAMIN D) 2000 UNITS tablet Take 2,000 Units by mouth daily.   fexofenadine (ALLEGRA) 30 MG tablet Take 30 mg by mouth daily as needed (for allergies).    HUMIRA, 2  PEN, 40 MG/0.4ML pen INJECT 1 PEN UNDER THE SKIN EVERY 14 DAYS.   hyoscyamine (LEVSIN SL) 0.125 MG SL tablet  Take 1-2 tablet every 4-6 hours as needed for abdominal pain/spasm   [DISCONTINUED] atorvastatin (LIPITOR) 80 MG tablet Take 1 tablet (80 mg total) by mouth daily.   [DISCONTINUED] carvedilol (COREG) 6.25 MG tablet Take 1 tablet (6.25 mg total) by mouth 2 (two) times daily with a meal.   [DISCONTINUED] losartan (COZAAR) 25 MG tablet Take 1 tablet (25 mg total) by mouth daily.   [DISCONTINUED] nitroGLYCERIN (NITROSTAT) 0.4 MG SL tablet Place 1 tablet (0.4 mg total) under the tongue every 5 (five) minutes as needed for chest pain (Do not give more than 3 SL tablets in 15 minutes.).   [DISCONTINUED] prasugrel (EFFIENT) 10 MG TABS tablet Take 1 tablet (10 mg total) by mouth daily.   [DISCONTINUED] spironolactone (ALDACTONE) 25 MG tablet Take 0.5 tablets (12.5 mg total) by mouth daily.     Physical Exam:    VS:  BP (!) 112/54 (BP Location: Left Arm, Patient Position: Sitting, Cuff Size: Normal)   Pulse (!) 57   Ht 5\' 8"  (1.727 m)   Wt 180 lb (81.6 kg)   BMI 27.37 kg/m    Wt Readings from Last 3 Encounters:  07/24/23 180 lb (81.6 kg)  07/16/23 190 lb (86.2 kg)  06/11/23 190 lb (86.2 kg)    GEN: Well nourished, well developed in no acute distress NECK: No JVD; No carotid bruits CARDIAC: RRR, no murmurs, rubs, gallops, right radial site clean and intact without evidence of hematoma RESPIRATORY:  Clear to auscultation without rales, wheezing or rhonchi  ABDOMEN: Soft, non-tender, non-distended EXTREMITIES:  No edema; No acute deformity   Asessement and Plan:.    CAD/Inferior STEMI: Cardiac catheterization on 07/16/2023 with two-vessel CAD, 90% mid LAD treated with PCI/DES x 1, 80% large PL branch of RCA treated with PCI/DES x 1.  Recommended for DAPT with aspirin and Effient for at least 1 year. Today he reports that he he is doing very well, he denies any chest pain or shortness of  breath.  Right radial cath site is clean and intact without evidence of hematoma.  Patient does have a raised and bruised area along the posterior lateral aspect of the left forearm that he feels suddenly appeared this morning, there is some healing bruising around the area.  Patient was able to undergo venous duplex while at office which showed no evidence of DVT in the upper extremity.  He will continue to monitor the area.  Patient may start cardiac rehab.  Reviewed ED precautions. Continue aspirin, Effient, atorvastatin 80 mg daily, carvedilol 6.25 mg twice daily, losartan 25 mg daily and spironolactone 12.5 mg daily.  Refill sent to patient's home pharmacy.  Check BMET.   Hyperlipidemia: Last lipid profile indicated total cholesterol 289, HDL 49, triglycerides 172 and LDL 207.  Lipoprotein a 64.6.  Patient was started on atorvastatin 80 mg daily during admission.  Check fasting lipid profile and LFTs in 8 weeks.  NSVT: Noted post reperfusion, patient was asymptomatic.  Today he reports that he denies any palpations.  Continue carvedilol 6.25 mg twice daily.  Hypertension: Blood pressure today 112/54. Continue current antihypertensive regimen.    Cardiac Rehabilitation Eligibility Assessment  The patient is ready to start cardiac rehabilitation from a cardiac standpoint.    Disposition: F/u with Terry Littler, NP in two months.   Signed, Rip Harbour, NP

## 2023-07-24 ENCOUNTER — Other Ambulatory Visit (HOSPITAL_COMMUNITY): Payer: Self-pay | Admitting: Cardiology

## 2023-07-24 ENCOUNTER — Encounter: Payer: Self-pay | Admitting: Cardiology

## 2023-07-24 ENCOUNTER — Ambulatory Visit
Admission: RE | Admit: 2023-07-24 | Discharge: 2023-07-24 | Disposition: A | Discharge status: Home / Self Care | Payer: Medicare Other | Source: Ambulatory Visit | Attending: Cardiology | Admitting: Cardiology

## 2023-07-24 ENCOUNTER — Ambulatory Visit (INDEPENDENT_AMBULATORY_CARE_PROVIDER_SITE_OTHER): Payer: Medicare Other | Admitting: Cardiology

## 2023-07-24 VITALS — BP 112/54 | HR 57 | Ht 68.0 in | Wt 180.0 lb

## 2023-07-24 DIAGNOSIS — I4729 Other ventricular tachycardia: Secondary | ICD-10-CM | POA: Insufficient documentation

## 2023-07-24 DIAGNOSIS — I10 Essential (primary) hypertension: Secondary | ICD-10-CM

## 2023-07-24 DIAGNOSIS — M79602 Pain in left arm: Secondary | ICD-10-CM | POA: Insufficient documentation

## 2023-07-24 DIAGNOSIS — E782 Mixed hyperlipidemia: Secondary | ICD-10-CM | POA: Diagnosis present

## 2023-07-24 DIAGNOSIS — M7989 Other specified soft tissue disorders: Secondary | ICD-10-CM | POA: Insufficient documentation

## 2023-07-24 DIAGNOSIS — I2510 Atherosclerotic heart disease of native coronary artery without angina pectoris: Secondary | ICD-10-CM | POA: Insufficient documentation

## 2023-07-24 MED ORDER — CARVEDILOL 6.25 MG PO TABS: 6.2500 mg | ORAL_TABLET | Freq: Two times a day (BID) | ORAL | 2 refills | Status: DC

## 2023-07-24 MED ORDER — PRASUGREL HCL 10 MG PO TABS: 10.0000 mg | ORAL_TABLET | Freq: Every day | ORAL | 2 refills | Status: DC

## 2023-07-24 MED ORDER — SPIRONOLACTONE 25 MG PO TABS: 12.5000 mg | ORAL_TABLET | Freq: Every day | ORAL | 1 refills | Status: DC

## 2023-07-24 MED ORDER — LOSARTAN POTASSIUM 25 MG PO TABS
25.0000 mg | ORAL_TABLET | Freq: Every day | ORAL | 1 refills | Status: DC
Start: 2023-07-24 — End: 2024-01-31

## 2023-07-24 MED ORDER — ATORVASTATIN CALCIUM 80 MG PO TABS: 80.0000 mg | ORAL_TABLET | Freq: Every day | ORAL | 1 refills | Status: DC

## 2023-07-24 MED ORDER — NITROGLYCERIN 0.4 MG SL SUBL: 0.4000 mg | SUBLINGUAL_TABLET | SUBLINGUAL | 2 refills | Status: AC | PRN

## 2023-07-24 NOTE — Patient Instructions (Signed)
 Medication Instructions:  No changes *If you need a refill on your cardiac medications before your next appointment, please call your pharmacy*  Lab Work: In 2 days if we have not received a fax from Dr. Clelia Croft we will need a bmet In 2 months we will need to draw Lipid panel and LFT. If you have labs (blood work) drawn today and your tests are completely normal, you will receive your results only by: MyChart Message (if you have MyChart) OR A paper copy in the mail If you have any lab test that is abnormal or we need to change your treatment, we will call you to review the results.  Testing/Procedures: Today we hade a DVT performed on your left arm.  Follow-Up: At [facility], you and your health needs are our priority.  As part of our continuing mission to provide you with exceptional heart care, we have created designated Provider Care Teams.  These Care Teams include your primary Cardiologist (physician) and Advanced Practice Providers (APPs -  Physician Assistants and Nurse Practitioners) who all work together to provide you with the care you need, when you need it.  We recommend signing up for the patient portal called "MyChart".  Sign up information is provided on this After Visit Summary.  MyChart is used to connect with patients for Virtual Visits (Telemedicine).  Patients are able to view lab/test results, encounter notes, upcoming appointments, etc.  Non-urgent messages can be sent to your provider as well.   To learn more about what you can do with MyChart, go to [email or web address].    Your next appointment:   2 month(s)  Provider:   Reather Littler, NP    Then, Rollene Rotunda, MD will plan to see you again in 5 month(s).

## 2023-07-25 LAB — BASIC METABOLIC PANEL
BUN/Creatinine Ratio: 22 (ref 10–24)
BUN: 23 mg/dL (ref 8–27)
CO2: 25 mmol/L (ref 20–29)
Calcium: 9.8 mg/dL (ref 8.6–10.2)
Chloride: 104 mmol/L (ref 96–106)
Creatinine, Ser: 1.05 mg/dL (ref 0.76–1.27)
Glucose: 86 mg/dL (ref 70–99)
Potassium: 4.7 mmol/L (ref 3.5–5.2)
Sodium: 143 mmol/L (ref 134–144)
eGFR: 78 mL/min/{1.73_m2} (ref >59)

## 2023-07-26 ENCOUNTER — Telehealth (HOSPITAL_COMMUNITY): Payer: Self-pay

## 2023-07-30 ENCOUNTER — Encounter (HOSPITAL_COMMUNITY)
Admission: RE | Admit: 2023-07-30 | Discharge: 2023-07-30 | Disposition: A | Discharge status: Home / Self Care | Payer: Medicare Other | Source: Ambulatory Visit | Attending: Cardiology | Admitting: Cardiology

## 2023-07-30 VITALS — BP 112/70 | HR 60 | Ht 68.0 in | Wt 178.4 lb

## 2023-07-30 DIAGNOSIS — I252 Old myocardial infarction: Secondary | ICD-10-CM | POA: Diagnosis present

## 2023-07-30 DIAGNOSIS — Z955 Presence of coronary angioplasty implant and graft: Secondary | ICD-10-CM | POA: Insufficient documentation

## 2023-07-30 DIAGNOSIS — I2102 ST elevation (STEMI) myocardial infarction involving left anterior descending coronary artery: Secondary | ICD-10-CM

## 2023-07-30 DIAGNOSIS — Z48812 Encounter for surgical aftercare following surgery on the circulatory system: Secondary | ICD-10-CM | POA: Insufficient documentation

## 2023-07-30 NOTE — Progress Notes (Signed)
 Cardiac Rehab Medication Review   Does the patient  feel that his/her medications are working for him/her?  YES  Has the patient been experiencing any side effects to the medications prescribed?   NO  Does the patient measure his/her own blood pressure or blood glucose at home?  YES    Does the patient have any problems obtaining medications due to transportation or finances?   NO  Understanding of regimen: excellent Understanding of indications: excellent Potential of compliance: excellent    Comments: Terry Kerr has a list of his medications and knows his regime well. He checks his weight and BP daily and keeps a log.     [name], MS, ACSM-CEP 07/30/2023 12:23 PM

## 2023-07-30 NOTE — Progress Notes (Signed)
 Cardiac Individual Treatment Plan  Patient Details  Name: Terry Kerr MRN: [id number] Date of Birth: Mar 31, 1956 Referring Provider:   Flowsheet Row INTENSIVE CARDIAC REHAB ORIENT from 07/30/2023 in [facility]  Referring Provider Rollene Rotunda, MD       Initial Encounter Date:  Flowsheet Row INTENSIVE CARDIAC REHAB ORIENT from 07/30/2023 in [facility]  Date 07/30/23       Visit Diagnosis: 07/16/23 STEMI  07/16/23 DES LAD  Patient's Home Medications on Admission:  Current Outpatient Medications:    aspirin EC 81 MG tablet, Take 1 tablet (81 mg total) by mouth daily. Swallow whole., Disp: 90 tablet, Rfl: 2   atorvastatin (LIPITOR) 80 MG tablet, Take 1 tablet (80 mg total) by mouth daily., Disp: 90 tablet, Rfl: 1   carvedilol (COREG) 6.25 MG tablet, Take 1 tablet (6.25 mg total) by mouth 2 (two) times daily with a meal., Disp: 60 tablet, Rfl: 2   Cholecalciferol (VITAMIN D) 2000 UNITS tablet, Take 2,000 Units by mouth daily., Disp: , Rfl:    fexofenadine (ALLEGRA) 30 MG tablet, Take 30 mg by mouth daily as needed (for allergies). , Disp: , Rfl:    HUMIRA, 2 PEN, 40 MG/0.4ML pen, INJECT 1 PEN UNDER THE SKIN EVERY 14 DAYS., Disp: 2 each, Rfl: 3   hyoscyamine (LEVSIN SL) 0.125 MG SL tablet, Take 1-2 tablet every 4-6 hours as needed for abdominal pain/spasm, Disp: 120 tablet, Rfl: 1   losartan (COZAAR) 25 MG tablet, Take 1 tablet (25 mg total) by mouth daily., Disp: 90 tablet, Rfl: 1   nitroGLYCERIN (NITROSTAT) 0.4 MG SL tablet, Place 1 tablet (0.4 mg total) under the tongue every 5 (five) minutes as needed for chest pain (Do not give more than 3 SL tablets in 15 minutes.)., Disp: 25 tablet, Rfl: 2   prasugrel (EFFIENT) 10 MG TABS tablet, Take 1 tablet (10 mg total) by mouth daily., Disp: 90 tablet, Rfl: 2   spironolactone (ALDACTONE) 25 MG tablet, Take 0.5 tablets (12.5  mg total) by mouth daily., Disp: 30 tablet, Rfl: 1  Past Medical History: Past Medical History:  Diagnosis Date   Allergic rhinitis    pollen, animal hair, dust mites   Allergy    Crohn disease (HCC)    Diverticulosis    Hemorrhoids, internal, thrombosed s/p resection/pexy 12/06/2012 12/04/2012   HLD (hyperlipidemia)    Obesity    Regional enteritis (Crohn's disease) (HCC) 08/01/2007   Qualifier: Diagnosis of  By: Hetty Ely MD, Franne Grip    Small bowel obstruction [facility])     Tobacco Use: Social History   Tobacco Use  Smoking Status Never  Smokeless Tobacco Never    Labs: Review Flowsheet  More data exists      Latest Ref Rng & Units 12/19/2007 12/04/2008 02/23/2010 04/26/2010 07/16/2023  Labs for ITP Cardiac and Pulmonary Rehab  Cholestrol 0 - 200 mg/dL 846  962  - 952  841   LDL (calc) 0 - 99 mg/dL 324  401  - - 027   Direct LDL mg/dL - - - 253.6  -  HDL-C >64 mg/dL 40.3  47.42  - 59.56  49   Trlycerides <150 mg/dL 387  564.3  - 329.5  188   Hemoglobin A1c 4.8 - 5.6 % - - - - 5.0   TCO2 0 - 100 mmol/L - - 22  - -    Capillary Blood  Glucose: No results found for: "GLUCAP"   Exercise Target Goals: Exercise Program Goal: Individual exercise prescription set using results from initial 6 min walk test and THRR while considering  patient's activity barriers and safety.   Exercise Prescription Goal: Initial exercise prescription builds to 30-45 minutes a day of aerobic activity, 2-3 days per week.  Home exercise guidelines will be given to patient during program as part of exercise prescription that the participant will acknowledge.  Activity Barriers & Risk Stratification:  Activity Barriers & Cardiac Risk Stratification - 07/30/23 1125       Activity Barriers & Cardiac Risk Stratification   Activity Barriers Neck/Spine Problems;Joint Problems    Cardiac Risk Stratification High   <5 METs on            6 Minute Walk:  6 Minute Walk     Row Name 07/30/23 1224          6 Minute Walk   Phase Initial     Distance 1860 feet     Walk Time 6 minutes     # of Rest Breaks 0     MPH 3.52     METS 4.03     RPE 9     Perceived Dyspnea  0     VO2 Peak 14.1     Symptoms No     Resting HR 57 bpm     Resting BP 112/70     Resting Oxygen Saturation  99 %     Exercise Oxygen Saturation  during 6 min walk 99 %     Max Ex. HR 97 bpm     Max Ex. BP 128/68     2 Minute Post BP 118/66              Oxygen Initial Assessment:   Oxygen Re-Evaluation:   Oxygen Discharge (Final Oxygen Re-Evaluation):   Initial Exercise Prescription:  Initial Exercise Prescription - 07/30/23 1200       Date of Initial Exercise RX and Referring Provider   Date 07/30/23    Referring Provider Rollene Rotunda, MD    Expected Discharge Date 10/24/23      Treadmill   MPH 3.2    Grade 0    Minutes 15    METs 3.5      Recumbant Elliptical   Level 2    RPM 60    Watts 80    Minutes 15    METs 3.5      Prescription Details   Frequency (times per week) 3    Duration Progress to 30 minutes of continuous aerobic without signs/symptoms of physical distress      Intensity   THRR 40-80% of Max Heartrate 61-122    Ratings of Perceived Exertion 11-13    Perceived Dyspnea 0-4      Progression   Progression Continue progressive overload as per policy without signs/symptoms or physical distress.      Resistance Training   Training Prescription Yes    Weight 4    Reps 10-15             Perform Capillary Blood Glucose checks as needed.  Exercise Prescription Changes:   Exercise Comments:   Exercise Goals and Review:   Exercise Goals     Row Name 07/30/23 1126             Exercise Goals   Increase Physical Activity Yes       Intervention Provide advice, education, support  and counseling about physical activity/exercise needs.;Develop an individualized exercise prescription for aerobic and resistive training based on initial evaluation  findings, risk stratification, comorbidities and participant's personal goals.       Expected Outcomes Short Term: Attend rehab on a regular basis to increase amount of physical activity.;Long Term: Exercising regularly at least 3-5 days a week.;Long Term: Add in home exercise to make exercise part of routine and to increase amount of physical activity.       Increase Strength and Stamina Yes       Intervention Provide advice, education, support and counseling about physical activity/exercise needs.;Develop an individualized exercise prescription for aerobic and resistive training based on initial evaluation findings, risk stratification, comorbidities and participant's personal goals.       Expected Outcomes Short Term: Increase workloads from initial exercise prescription for resistance, speed, and METs.;Long Term: Improve cardiorespiratory fitness, muscular endurance and strength as measured by increased METs and functional capacity ( );Short Term: Perform resistance training exercises routinely during rehab and add in resistance training at home       Able to understand and use rate of perceived exertion (RPE) scale Yes       Intervention Provide education and explanation on how to use RPE scale       Expected Outcomes Long Term:  Able to use RPE to guide intensity level when exercising independently;Short Term: Able to use RPE daily in rehab to express subjective intensity level       Knowledge and understanding of Target Heart Rate Range (THRR) Yes       Intervention Provide education and explanation of THRR including how the numbers were predicted and where they are located for reference       Expected Outcomes Short Term: Able to state/look up THRR;Short Term: Able to use daily as guideline for intensity in rehab;Long Term: Able to use THRR to govern intensity when exercising independently       Understanding of Exercise Prescription Yes       Intervention Provide education, explanation, and  written materials on patient's individual exercise prescription       Expected Outcomes Short Term: Able to explain program exercise prescription;Long Term: Able to explain home exercise prescription to exercise independently                Exercise Goals Re-Evaluation :   Discharge Exercise Prescription (Final Exercise Prescription Changes):   Nutrition:  Target Goals: Understanding of nutrition guidelines, daily intake of sodium 1500mg , cholesterol 200mg , calories 30% from fat and 7% or less from saturated fats, daily to have 5 or more servings of fruits and vegetables.  Biometrics:  Pre Biometrics - 07/30/23 1123       Pre Biometrics   Waist Circumference 40 inches    Hip Circumference 39 inches    Waist to Hip Ratio 1.03 %    Triceps Skinfold 12 mm    % Body Fat 26.4 %    Grip Strength 40 kg    Flexibility 0 in   could not reach   Single Leg Stand 23 seconds              Nutrition Therapy Plan and Nutrition Goals:   Nutrition Assessments:  MEDIFICTS Score Key: >=70 Need to make dietary changes  40-70 Heart Healthy Diet <= 40 Therapeutic Level Cholesterol Diet    Picture Your Plate Scores: <16 Unhealthy dietary pattern with much room for improvement. 41-50 Dietary pattern unlikely to meet recommendations for good health and  room for improvement. 51-60 More healthful dietary pattern, with some room for improvement.  >60 Healthy dietary pattern, although there may be some specific behaviors that could be improved.    Nutrition Goals Re-Evaluation:   Nutrition Goals Re-Evaluation:   Nutrition Goals Discharge (Final Nutrition Goals Re-Evaluation):   Psychosocial: Target Goals: Acknowledge presence or absence of significant depression and/or stress, maximize coping skills, provide positive support system. Participant is able to verbalize types and ability to use techniques and skills needed for reducing stress and depression.  Initial Review &  Psychosocial Screening:  Initial Psych Review & Screening - 07/30/23 1126       Initial Review   Current issues with None Identified      Family Dynamics   Good Support System? Yes   Wife for support     Barriers   Psychosocial barriers to participate in program There are no identifiable barriers or psychosocial needs.      Screening Interventions   Interventions Encouraged to exercise;Provide feedback about the scores to participant    Expected Outcomes Short Term goal: Identification and review with participant of any Quality of Life or Depression concerns found by scoring the questionnaire.;Long Term goal: The participant improves quality of Life and PHQ9 Scores as seen by post scores and/or verbalization of changes             Quality of Life Scores:  Quality of Life - 07/30/23 1226       Quality of Life   Select Quality of Life      Quality of Life Scores   Health/Function Pre 26.8 %    Socioeconomic Pre 29.64 %    Psych/Spiritual Pre 28.93 %    Family Pre 30 %    GLOBAL Pre 28.29 %            Scores of 19 and below usually indicate a poorer quality of life in these areas.  A difference of  2-3 points is a clinically meaningful difference.  A difference of 2-3 points in the total score of the Quality of Life Index has been associated with significant improvement in overall quality of life, self-image, physical symptoms, and general health in studies assessing change in quality of life.  PHQ-9: Review Flowsheet       07/30/2023  Depression screen PHQ 2/9  Decreased Interest 0  Down, Depressed, Hopeless 0  PHQ - 2 Score 0  Altered sleeping 0  Tired, decreased energy 0  Change in appetite 0  Feeling bad or failure about yourself  0  Trouble concentrating 0  Moving slowly or fidgety/restless 0  Suicidal thoughts 0  PHQ-9 Score 0   Interpretation of Total Score  Total Score Depression Severity:  1-4 = Minimal depression, 5-9 = Mild depression, 10-14 =  Moderate depression, 15-19 = Moderately severe depression, 20-27 = Severe depression   Psychosocial Evaluation and Intervention:   Psychosocial Re-Evaluation:   Psychosocial Discharge (Final Psychosocial Re-Evaluation):   Vocational Rehabilitation: Provide vocational rehab assistance to qualifying candidates.   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 Participant will state understanding/return demonstration of topics presented.     Core Videos: Exercise  Move It!  Clinical staff conducted group or individual video education with verbal and written material and guidebook.  Patient learns the recommended Pritikin exercise program. Exercise with the goal of living a long, healthy life. Some of the health benefits of exercise include controlled diabetes, healthier blood pressure levels, improved cholesterol levels, improved heart and lung capacity, improved sleep, and better body composition. Everyone should speak with their doctor before starting or changing an exercise routine.  Biomechanical Limitations Clinical staff conducted group or individual video education with verbal and written material and guidebook.  Patient learns how biomechanical limitations can impact exercise and how we can mitigate and possibly overcome limitations to have an impactful and balanced exercise routine.  Body Composition Clinical staff conducted group or individual video education with verbal and written material and guidebook.  Patient learns that body composition (ratio of muscle mass to fat mass) is a key component to assessing overall fitness, rather than body weight alone. Increased fat mass, especially  visceral belly fat, can put [place] at increased risk for metabolic syndrome, type 2 diabetes, heart disease, and even death. It is recommended to combine diet and exercise (cardiovascular and resistance training) to improve your body composition. Seek guidance from your physician and exercise physiologist before implementing an exercise routine.  Exercise Action Plan Clinical staff conducted group or individual video education with verbal and written material and guidebook.  Patient learns the recommended strategies to achieve and enjoy long-term exercise adherence, including variety, self-motivation, self-efficacy, and positive decision making. Benefits of exercise include fitness, good health, weight management, more energy, better sleep, less stress, and overall well-being.  Medical   Heart Disease Risk Reduction Clinical staff conducted group or individual video education with verbal and written material and guidebook.  Patient learns our heart is our most vital organ as it circulates oxygen, nutrients, white blood cells, and hormones throughout the entire body, and carries waste away. Data supports a plant-based eating plan like the Pritikin Program for its effectiveness in slowing progression of and reversing heart disease. The video provides a number of recommendations to address heart disease.   Metabolic Syndrome and Belly Fat  Clinical staff conducted group or individual video education with verbal and written material and guidebook.  Patient learns what metabolic syndrome is, how it leads to heart disease, and how one can reverse it and keep it from coming back. You have metabolic syndrome if you have 3 of the following 5 criteria: abdominal obesity, high blood pressure, high triglycerides, low HDL cholesterol, and high blood sugar.  Hypertension and Heart Disease Clinical staff conducted group or individual video education with verbal and written material and guidebook.  Patient learns that  high blood pressure, or hypertension, is very common in the [place]. Hypertension is largely due to excessive salt intake, but other important risk factors include being overweight, physical inactivity, drinking too much alcohol, smoking, and not eating enough potassium from fruits and vegetables. High blood pressure is a leading risk factor for heart attack, stroke, congestive heart failure, dementia, kidney failure, and premature death. Long-term effects of excessive salt intake include stiffening of the arteries and thickening of heart muscle and organ damage. Recommendations include ways to reduce hypertension and the risk of heart disease.  Diseases of Our Time - Focusing on Diabetes Clinical staff conducted group or individual video education with verbal and written material and guidebook.  Patient learns why the best way to stop diseases of our time is prevention, through food and other  lifestyle changes. Medicine (such as prescription pills and surgeries) is often only a Band-Aid on the problem, not a long-term solution. Most common diseases of our time include obesity, type 2 diabetes, hypertension, heart disease, and cancer. The Pritikin Program is recommended and has been proven to help reduce, reverse, and/or prevent the damaging effects of metabolic syndrome.  Nutrition   Overview of the Pritikin Eating Plan  Clinical staff conducted group or individual video education with verbal and written material and guidebook.  Patient learns about the Pritikin Eating Plan for disease risk reduction. The Pritikin Eating Plan emphasizes a wide variety of unrefined, minimally-processed carbohydrates, like fruits, vegetables, whole grains, and legumes. Go, Caution, and Stop food choices are explained. Plant-based and lean animal proteins are emphasized. Rationale provided for low sodium intake for blood pressure control, low added sugars for blood sugar stabilization, and low added fats and oils for  coronary artery disease risk reduction and weight management.  Calorie Density  Clinical staff conducted group or individual video education with verbal and written material and guidebook.  Patient learns about calorie density and how it impacts the Pritikin Eating Plan. Knowing the characteristics of the food you choose will help you decide whether those foods will lead to weight gain or weight loss, and whether you want to consume more or less of them. Weight loss is usually a side effect of the Pritikin Eating Plan because of its focus on low calorie-dense foods.  Label Reading  Clinical staff conducted group or individual video education with verbal and written material and guidebook.  Patient learns about the Pritikin recommended label reading guidelines and corresponding recommendations regarding calorie density, added sugars, sodium content, and whole grains.  Dining Out - Part 1  Clinical staff conducted group or individual video education with verbal and written material and guidebook.  Patient learns that restaurant meals can be sabotaging because they can be so high in calories, fat, sodium, and/or sugar. Patient learns recommended strategies on how to positively address this and avoid unhealthy pitfalls.  Facts on Fats  Clinical staff conducted group or individual video education with verbal and written material and guidebook.  Patient learns that lifestyle modifications can be just as effective, if not more so, as many medications for lowering your risk of heart disease. A Pritikin lifestyle can help to reduce your risk of inflammation and atherosclerosis (cholesterol build-up, or plaque, in the artery walls). Lifestyle interventions such as dietary choices and physical activity address the cause of atherosclerosis. A review of the types of fats and their impact on blood cholesterol levels, along with dietary recommendations to reduce fat intake is also included.  Nutrition Action Plan   Clinical staff conducted group or individual video education with verbal and written material and guidebook.  Patient learns how to incorporate Pritikin recommendations into their lifestyle. Recommendations include planning and keeping personal health goals in mind as an important part of their success.  Healthy Mind-Set    Healthy Minds, Bodies, Hearts  Clinical staff conducted group or individual video education with verbal and written material and guidebook.  Patient learns how to identify when they are stressed. Video will discuss the impact of that stress, as well as the many benefits of stress management. Patient will also be introduced to stress management techniques. The way we think, act, and feel has an impact on our hearts.  How Our Thoughts Can Heal Our Hearts  Clinical staff conducted group or individual video education with verbal and written material  and guidebook.  Patient learns that negative thoughts can cause depression and anxiety. This can result in negative lifestyle behavior and serious health problems. Cognitive behavioral therapy is an effective method to help control our thoughts in order to change and improve our emotional outlook.  Additional Videos:  Exercise    Improving Performance  Clinical staff conducted group or individual video education with verbal and written material and guidebook.  Patient learns to use a non-linear approach by alternating intensity levels and lengths of time spent exercising to help burn more calories and lose more body fat. Cardiovascular exercise helps improve heart health, metabolism, hormonal balance, blood sugar control, and recovery from fatigue. Resistance training improves strength, endurance, balance, coordination, reaction time, metabolism, and muscle mass. Flexibility exercise improves circulation, posture, and balance. Seek guidance from your physician and exercise physiologist before implementing an exercise routine and learn  your capabilities and proper form for all exercise.  Introduction to Yoga  Clinical staff conducted group or individual video education with verbal and written material and guidebook.  Patient learns about yoga, a discipline of the coming together of mind, breath, and body. The benefits of yoga include improved flexibility, improved range of motion, better posture and core strength, increased lung function, weight loss, and positive self-image. Yoga's heart health benefits include lowered blood pressure, healthier heart rate, decreased cholesterol and triglyceride levels, improved immune function, and reduced stress. Seek guidance from your physician and exercise physiologist before implementing an exercise routine and learn your capabilities and proper form for all exercise.  Medical   Aging: Enhancing Your Quality of Life  Clinical staff conducted group or individual video education with verbal and written material and guidebook.  Patient learns key strategies and recommendations to stay in good physical health and enhance quality of life, such as prevention strategies, having an advocate, securing a Health Care Proxy and Power of Attorney, and keeping a list of medications and system for tracking them. It also discusses how to avoid risk for bone loss.  Biology of Weight Control  Clinical staff conducted group or individual video education with verbal and written material and guidebook.  Patient learns that weight gain occurs because we consume more calories than we burn (eating more, moving less). Even if your body weight is normal, you may have higher ratios of fat compared to muscle mass. Too much body fat puts you at increased risk for cardiovascular disease, heart attack, stroke, type 2 diabetes, and obesity-related cancers. In addition to exercise, following the Pritikin Eating Plan can help reduce your risk.  Decoding Lab Results  Clinical staff conducted group or individual video education  with verbal and written material and guidebook.  Patient learns that lab test reflects one measurement whose values change over time and are influenced by many factors, including medication, stress, sleep, exercise, food, hydration, pre-existing medical conditions, and more. It is recommended to use the knowledge from this video to become more involved with your lab results and evaluate your numbers to speak with your doctor.   Diseases of Our Time - Overview  Clinical staff conducted group or individual video education with verbal and written material and guidebook.  Patient learns that according to the CDC, 50% to 70% of chronic diseases (such as obesity, type 2 diabetes, elevated lipids, hypertension, and heart disease) are avoidable through lifestyle improvements including healthier food choices, listening to satiety cues, and increased physical activity.  Sleep Disorders Clinical staff conducted group or individual video education with verbal and written  material and guidebook.  Patient learns how good quality and duration of sleep are important to overall health and well-being. Patient also learns about sleep disorders and how they impact health along with recommendations to address them, including discussing with a physician.  Nutrition  Dining Out - Part 2 Clinical staff conducted group or individual video education with verbal and written material and guidebook.  Patient learns how to plan ahead and communicate in order to maximize their dining experience in a healthy and nutritious manner. Included are recommended food choices based on the type of restaurant the patient is visiting.   Fueling a Banker conducted group or individual video education with verbal and written material and guidebook.  There is a strong connection between our food choices and our health. Diseases like obesity and type 2 diabetes are very prevalent and are in large-part due to lifestyle  choices. The Pritikin Eating Plan provides plenty of food and hunger-curbing satisfaction. It is easy to follow, affordable, and helps reduce health risks.  Menu Workshop  Clinical staff conducted group or individual video education with verbal and written material and guidebook.  Patient learns that restaurant meals can sabotage health goals because they are often packed with calories, fat, sodium, and sugar. Recommendations include strategies to plan ahead and to communicate with the manager, chef, or server to help order a healthier meal.  Planning Your Eating Strategy  Clinical staff conducted group or individual video education with verbal and written material and guidebook.  Patient learns about the Pritikin Eating Plan and its benefit of reducing the risk of disease. The Pritikin Eating Plan does not focus on calories. Instead, it emphasizes high-quality, nutrient-rich foods. By knowing the characteristics of the foods, we choose, we can determine their calorie density and make informed decisions.  Targeting Your Nutrition Priorities  Clinical staff conducted group or individual video education with verbal and written material and guidebook.  Patient learns that lifestyle habits have a tremendous impact on disease risk and progression. This video provides eating and physical activity recommendations based on your personal health goals, such as reducing LDL cholesterol, losing weight, preventing or controlling type 2 diabetes, and reducing high blood pressure.  Vitamins and Minerals  Clinical staff conducted group or individual video education with verbal and written material and guidebook.  Patient learns different ways to obtain key vitamins and minerals, including through a recommended healthy diet. It is important to discuss all supplements you take with your doctor.   Healthy Mind-Set    Smoking Cessation  Clinical staff conducted group or individual video education with verbal and  written material and guidebook.  Patient learns that cigarette smoking and tobacco addiction pose a serious health risk which affects millions of people. Stopping smoking will significantly reduce the risk of heart disease, lung disease, and many forms of cancer. Recommended strategies for quitting are covered, including working with your doctor to develop a successful plan.  Culinary   Becoming a Set designer conducted group or individual video education with verbal and written material and guidebook.  Patient learns that cooking at home can be healthy, cost-effective, quick, and puts them in control. Keys to cooking healthy recipes will include looking at your recipe, assessing your equipment needs, planning ahead, making it simple, choosing cost-effective seasonal ingredients, and limiting the use of added fats, salts, and sugars.  Cooking - Breakfast and Snacks  Clinical staff conducted group or individual video education with verbal and written  material and guidebook.  Patient learns how important breakfast is to satiety and nutrition through the entire day. Recommendations include key foods to eat during breakfast to help stabilize blood sugar levels and to prevent overeating at meals later in the day. Planning ahead is also a key component.  Cooking - Educational psychologist conducted group or individual video education with verbal and written material and guidebook.  Patient learns eating strategies to improve overall health, including an approach to cook more at home. Recommendations include thinking of animal protein as a side on your plate rather than center stage and focusing instead on lower calorie dense options like vegetables, fruits, whole grains, and plant-based proteins, such as beans. Making sauces in large quantities to freeze for later and leaving the skin on your vegetables are also recommended to maximize your experience.  Cooking - Healthy Salads and  Dressing Clinical staff conducted group or individual video education with verbal and written material and guidebook.  Patient learns that vegetables, fruits, whole grains, and legumes are the foundations of the Pritikin Eating Plan. Recommendations include how to incorporate each of these in flavorful and healthy salads, and how to create homemade salad dressings. Proper handling of ingredients is also covered. Cooking - Soups and [employer] - Soups and Desserts Clinical staff conducted group or individual video education with verbal and written material and guidebook.  Patient learns that Pritikin soups and desserts make for easy, nutritious, and delicious snacks and meal components that are low in sodium, fat, sugar, and calorie density, while high in vitamins, minerals, and filling fiber. Recommendations include simple and healthy ideas for soups and desserts.   Overview     The Pritikin Solution Program Overview Clinical staff conducted group or individual video education with verbal and written material and guidebook.  Patient learns that the results of the Pritikin Program have been documented in more than 100 articles published in peer-reviewed journals, and the benefits include reducing risk factors for (and, in some cases, even reversing) high cholesterol, high blood pressure, type 2 diabetes, obesity, and more! An overview of the three key pillars of the Pritikin Program will be covered: eating well, doing regular exercise, and having a healthy mind-set.  WORKSHOPS  Exercise: Exercise Basics: Building Your Action Plan Clinical staff led group instruction and group discussion with PowerPoint presentation and patient guidebook. To enhance the learning environment the use of posters, models and videos may be added. At the conclusion of this workshop, patients will comprehend the difference between physical activity and exercise, as well as the benefits of incorporating both, into  their routine. Patients will understand the FITT (Frequency, Intensity, Time, and Type) principle and how to use it to build an exercise action plan. In addition, safety concerns and other considerations for exercise and cardiac rehab will be addressed by the presenter. The purpose of this lesson is to promote a comprehensive and effective weekly exercise routine in order to improve patients' overall level of fitness.   Managing Heart Disease: Your Path to a Healthier Heart Clinical staff led group instruction and group discussion with PowerPoint presentation and patient guidebook. To enhance the learning environment the use of posters, models and videos may be added.At the conclusion of this workshop, patients will understand the anatomy and physiology of the heart. Additionally, they will understand how Pritikin's three pillars impact the risk factors, the progression, and the management of heart disease.  The purpose of this lesson is to provide a  high-level overview of the heart, heart disease, and how the Pritikin lifestyle positively impacts risk factors.  Exercise Biomechanics Clinical staff led group instruction and group discussion with PowerPoint presentation and patient guidebook. To enhance the learning environment the use of posters, models and videos may be added. Patients will learn how the structural parts of their bodies function and how these functions impact their daily activities, movement, and exercise. Patients will learn how to promote a neutral spine, learn how to manage pain, and identify ways to improve their physical movement in order to promote healthy living. The purpose of this lesson is to expose patients to common physical limitations that impact physical activity. Participants will learn practical ways to adapt and manage aches and pains, and to minimize their effect on regular exercise. Patients will learn how to maintain good posture while sitting, walking, and  lifting.  Balance Training and Fall Prevention  Clinical staff led group instruction and group discussion with PowerPoint presentation and patient guidebook. To enhance the learning environment the use of posters, models and videos may be added. At the conclusion of this workshop, patients will understand the importance of their sensorimotor skills (vision, proprioception, and the vestibular system) in maintaining their ability to balance as they age. Patients will apply a variety of balancing exercises that are appropriate for their current level of function. Patients will understand the common causes for poor balance, possible solutions to these problems, and ways to modify their physical environment in order to minimize their fall risk. The purpose of this lesson is to teach patients about the importance of maintaining balance as they age and ways to minimize their risk of falling.  WORKSHOPS   Nutrition:  Fueling a Ship broker led group instruction and group discussion with PowerPoint presentation and patient guidebook. To enhance the learning environment the use of posters, models and videos may be added. Patients will review the foundational principles of the Pritikin Eating Plan and understand what constitutes a serving size in each of the food groups. Patients will also learn Pritikin-friendly foods that are better choices when away from home and review make-ahead meal and snack options. Calorie density will be reviewed and applied to three nutrition priorities: weight maintenance, weight loss, and weight gain. The purpose of this lesson is to reinforce (in a group setting) the key concepts around what patients are recommended to eat and how to apply these guidelines when away from home by planning and selecting Pritikin-friendly options. Patients will understand how calorie density may be adjusted for different weight management goals.  Mindful Eating  Clinical staff led  group instruction and group discussion with PowerPoint presentation and patient guidebook. To enhance the learning environment the use of posters, models and videos may be added. Patients will briefly review the concepts of the Pritikin Eating Plan and the importance of low-calorie dense foods. The concept of mindful eating will be introduced as well as the importance of paying attention to internal hunger signals. Triggers for non-hunger eating and techniques for dealing with triggers will be explored. The purpose of this lesson is to provide patients with the opportunity to review the basic principles of the Pritikin Eating Plan, discuss the value of eating mindfully and how to measure internal cues of hunger and fullness using the Hunger Scale. Patients will also discuss reasons for non-hunger eating and learn strategies to use for controlling emotional eating.  Targeting Your Nutrition Priorities Clinical staff led group instruction and group discussion with PowerPoint presentation  and patient guidebook. To enhance the learning environment the use of posters, models and videos may be added. Patients will learn how to determine their genetic susceptibility to disease by reviewing their family history. Patients will gain insight into the importance of diet as part of an overall healthy lifestyle in mitigating the impact of genetics and other environmental insults. The purpose of this lesson is to provide patients with the opportunity to assess their personal nutrition priorities by looking at their family history, their own health history and current risk factors. Patients will also be able to discuss ways of prioritizing and modifying the Pritikin Eating Plan for their highest risk areas  Menu  Clinical staff led group instruction and group discussion with PowerPoint presentation and patient guidebook. To enhance the learning environment the use of posters, models and videos may be added. Using menus  brought in from [employer], or printed from [employer], patients will apply the Pritikin dining out guidelines that were presented in the [employer] video. Patients will also be able to practice these guidelines in a variety of provided scenarios. The purpose of this lesson is to provide patients with the opportunity to practice hands-on learning of the Pritikin Dining Out guidelines with actual menus and practice scenarios.  Label Reading Clinical staff led group instruction and group discussion with PowerPoint presentation and patient guidebook. To enhance the learning environment the use of posters, models and videos may be added. Patients will review and discuss the Pritikin label reading guidelines presented in Pritikin's Label Reading Educational series video. Using fool labels brought in from local grocery stores and markets, patients will apply the label reading guidelines and determine if the packaged food meet the Pritikin guidelines. The purpose of this lesson is to provide patients with the opportunity to review, discuss, and practice hands-on learning of the Pritikin Label Reading guidelines with actual packaged food labels. Cooking School  Pritikin's [employer] are designed to teach patients ways to prepare quick, simple, and affordable recipes at home. The importance of nutrition's role in chronic disease risk reduction is reflected in its emphasis in the overall Pritikin program. By learning how to prepare essential core Pritikin Eating Plan recipes, patients will increase control over what they eat; be able to customize the flavor of foods without the use of added salt, sugar, or fat; and improve the quality of the food they consume. By learning a set of core recipes which are easily assembled, quickly prepared, and affordable, patients are more likely to prepare more healthy foods at home. These workshops focus on convenient breakfasts, simple  entres, side dishes, and desserts which can be prepared with minimal effort and are consistent with nutrition recommendations for cardiovascular risk reduction. Cooking [employer] are taught by a Armed forces logistics/support/administrative officer (RD) who has been trained by the [employer]. The chef or RD has a clear understanding of the importance of minimizing - if not completely eliminating - added fat, sugar, and sodium in recipes. Throughout the series of Cooking School Workshop sessions, patients will learn about healthy ingredients and efficient methods of cooking to build confidence in their capability to prepare    Cooking School weekly topics:  Adding Flavor- Sodium-Free  Fast and Healthy Breakfasts  Powerhouse Plant-Based Proteins  Satisfying Salads and Dressings  Simple Sides and Sauces  International Cuisine-Spotlight on the [employer] Zones  Delicious Desserts  Savory Soups  [employer] - Meals in a Snap  Tasty Appetizers and  Snacks  Comforting Weekend Breakfasts  One-Pot Wonders   Fast [employer] Your Pritikin Plate  WORKSHOPS   Healthy Mindset (Psychosocial):  Focused Goals, Sustainable Changes Clinical staff led group instruction and group discussion with PowerPoint presentation and patient guidebook. To enhance the learning environment the use of posters, models and videos may be added. Patients will be able to apply effective goal setting strategies to establish at least one personal goal, and then take consistent, meaningful action toward that goal. They will learn to identify common barriers to achieving personal goals and develop strategies to overcome them. Patients will also gain an understanding of how our mind-set can impact our ability to achieve goals and the importance of cultivating a positive and growth-oriented mind-set. The purpose of this lesson is to provide patients with a deeper understanding of how to set and  achieve personal goals, as well as the tools and strategies needed to overcome common obstacles which may arise along the way.  From Head to Heart: The Power of a Healthy Outlook  Clinical staff led group instruction and group discussion with PowerPoint presentation and patient guidebook. To enhance the learning environment the use of posters, models and videos may be added. Patients will be able to recognize and describe the impact of emotions and mood on physical health. They will discover the importance of self-care and explore self-care practices which may work for them. Patients will also learn how to utilize the 4 C's to cultivate a healthier outlook and better manage stress and challenges. The purpose of this lesson is to demonstrate to patients how a healthy outlook is an essential part of maintaining good health, especially as they continue their cardiac rehab journey.  Healthy Sleep for a Healthy Heart Clinical staff led group instruction and group discussion with PowerPoint presentation and patient guidebook. To enhance the learning environment the use of posters, models and videos may be added. At the conclusion of this workshop, patients will be able to demonstrate knowledge of the importance of sleep to overall health, well-being, and quality of life. They will understand the symptoms of, and treatments for, common sleep disorders. Patients will also be able to identify daytime and nighttime behaviors which impact sleep, and they will be able to apply these tools to help manage sleep-related challenges. The purpose of this lesson is to provide patients with a general overview of sleep and outline the importance of quality sleep. Patients will learn about a few of the most common sleep disorders. Patients will also be introduced to the concept of "sleep hygiene," and discover ways to self-manage certain sleeping problems through simple daily behavior changes. Finally, the workshop will motivate  patients by clarifying the links between quality sleep and their goals of heart-healthy living.   Recognizing and Reducing Stress Clinical staff led group instruction and group discussion with PowerPoint presentation and patient guidebook. To enhance the learning environment the use of posters, models and videos may be added. At the conclusion of this workshop, patients will be able to understand the types of stress reactions, differentiate between acute and chronic stress, and recognize the impact that chronic stress has on their health. They will also be able to apply different coping mechanisms, such as reframing negative self-talk. Patients will have the opportunity to practice a variety of stress management techniques, such as deep abdominal breathing, progressive muscle relaxation, and/or guided imagery.  The purpose of this lesson is to educate patients on the role of stress  in their lives and to provide healthy techniques for coping with it.  Learning Barriers/Preferences:  Learning Barriers/Preferences - 07/30/23 1127       Learning Barriers/Preferences   Learning Barriers Sight   wears glasses   Learning Preferences Audio;Computer/Internet;Group Instruction;Individual Instruction;Pictoral;Skilled Demonstration;Verbal Instruction;Written Material;Video             Education Topics:  Knowledge Questionnaire Score:  Knowledge Questionnaire Score - 07/30/23 1134       Knowledge Questionnaire Score   Pre Score 23/24             Core Components/Risk Factors/Patient Goals at Admission:  Personal Goals and Risk Factors at Admission - 07/30/23 1128       Core Components/Risk Factors/Patient Goals on Admission    Weight Management Yes    Intervention Weight Management: Develop a combined nutrition and exercise program designed to reach desired caloric intake, while maintaining appropriate intake of nutrient and fiber, sodium and fats, and appropriate energy expenditure  required for the weight goal.;Weight Management: Provide education and appropriate resources to help participant work on and attain dietary goals.    Expected Outcomes Short Term: Continue to assess and modify interventions until short term weight is achieved;Long Term: Adherence to nutrition and physical activity/exercise program aimed toward attainment of established weight goal;Understanding recommendations for meals to include 15-35% energy as protein, 25-35% energy from fat, 35-60% energy from carbohydrates, less than 200mg  of dietary cholesterol, 20-35 gm of total fiber daily;Understanding of distribution of calorie intake throughout the day with the consumption of 4-5 meals/snacks    Hypertension Yes    Intervention Provide education on lifestyle modifcations including regular physical activity/exercise, weight management, moderate sodium restriction and increased consumption of fresh fruit, vegetables, and low fat dairy, alcohol moderation, and smoking cessation.;Monitor prescription use compliance.    Expected Outcomes Short Term: Continued assessment and intervention until BP is < 140/10mm HG in hypertensive participants. < 130/60mm HG in hypertensive participants with diabetes, heart failure or chronic kidney disease.;Long Term: Maintenance of blood pressure at goal levels.    Lipids Yes    Intervention Provide education and support for participant on nutrition & aerobic/resistive exercise along with prescribed medications to achieve LDL 70mg , HDL >40mg .    Expected Outcomes Short Term: Participant states understanding of desired cholesterol values and is compliant with medications prescribed. Participant is following exercise prescription and nutrition guidelines.;Long Term: Cholesterol controlled with medications as prescribed, with individualized exercise RX and with personalized nutrition plan. Value goals: LDL < 70mg , HDL > 40 mg.             Core Components/Risk Factors/Patient Goals  Review:    Core Components/Risk Factors/Patient Goals at Discharge (Final Review):    ITP Comments:  ITP Comments     Row Name 07/30/23 1124           ITP Comments Dr. Armanda Magic medical director. Introduction to pritikin education/intensive cardiac rehab. Initial orientation packet reviewed with patient.                Comments: Participant attended orientation for the cardiac rehabilitation program on  07/30/2023  to perform initial intake and exercise walk test. Patient introduced to the Pritikin Program education and orientation packet was reviewed. Completed 6-minute walk test, measurements, initial ITP, and exercise prescription. Vital signs stable. Telemetry-normal sinus rhythm, sinus bradycardia, asymptomatic.   Service time was from 1027 to 1200.   [name], MS, ACSM-CEP 07/30/2023 12:27 PM

## 2023-08-06 ENCOUNTER — Other Ambulatory Visit: Payer: Self-pay | Admitting: Internal Medicine

## 2023-08-06 ENCOUNTER — Encounter (HOSPITAL_COMMUNITY)
Admission: RE | Admit: 2023-08-06 | Discharge: 2023-08-06 | Disposition: A | Discharge status: Home / Self Care | Payer: Medicare Other | Source: Ambulatory Visit | Attending: Cardiology | Admitting: Cardiology

## 2023-08-06 DIAGNOSIS — Z955 Presence of coronary angioplasty implant and graft: Secondary | ICD-10-CM

## 2023-08-06 DIAGNOSIS — I252 Old myocardial infarction: Secondary | ICD-10-CM | POA: Diagnosis not present

## 2023-08-06 DIAGNOSIS — I2102 ST elevation (STEMI) myocardial infarction involving left anterior descending coronary artery: Secondary | ICD-10-CM

## 2023-08-06 NOTE — Progress Notes (Signed)
 Daily Session Note  Patient Details  Name: CASYN BECVAR MRN: [id number] Date of Birth: [birth year] Referring Provider:   Flowsheet Row INTENSIVE CARDIAC REHAB ORIENT from 07/30/2023 in [facility]  Referring Provider Rollene Rotunda, MD       Encounter Date: 08/06/2023  Check In:  Session Check In - 08/06/23 1041       Check-In   Supervising physician immediately available to respond to emergencies [facility] - Physician supervision    Physician(s) Tereso Newcomer PA    Location [facility]    Staff Present Valinda Party, MS, Exercise Physiologist;David Manus Gunning, MS, ACSM-CEP, CCRP, Exercise Physiologist;Olinty Peggye Pitt, MS, ACSM-CEP, Exercise Physiologist;Jetta Dan Humphreys BS, ACSM-CEP, Exercise Physiologist;Johnny Hale Bogus, MS, Exercise Physiologist;Ilario Dhaliwal, RN, BSN    Virtual Visit No    Medication changes reported     No    Fall or balance concerns reported    No    Tobacco Cessation No Change    Warm-up and Cool-down Performed as group-led instruction   CRP2 orientation   Resistance Training Performed Yes    VAD Patient? No    PAD/SET Patient? No      Pain Assessment   Currently in Pain? No/denies    Pain Score 0-No pain             Capillary Blood Glucose: No results found for this or any previous visit (from the past 24 hours).   Exercise Prescription Changes - 08/06/23 1025       Response to Exercise   Blood Pressure (Admit) 102/54    Blood Pressure (Exercise) 122/60    Blood Pressure (Exit) 104/62    Heart Rate (Admit) 60 bpm    Heart Rate (Exercise) 108 bpm    Heart Rate (Exit) 67 bpm    Rating of Perceived Exertion (Exercise) 11    Symptoms None    Comments Off to a good start with exercise.    Duration Continue with 30 min of aerobic exercise without signs/symptoms of physical distress.    Intensity THRR unchanged      Progression   Progression Continue to progress workloads to  maintain intensity without signs/symptoms of physical distress.    Average METs 3.5      Resistance Training   Training Prescription Yes    Weight 4 lbs    Reps 10-15    Time 10 Minutes      Interval Training   Interval Training No      Treadmill   MPH 3    Grade 0    Minutes 15    METs 3.3      Recumbant Elliptical   Level 2    RPM 58    Watts 75    Minutes 15    METs 3.7             Social History   Tobacco Use  Smoking Status Never  Smokeless Tobacco Never    Goals Met:  Exercise tolerated well No report of concerns or symptoms today Strength training completed today  Goals Unmet:  Not Applicable  Comments: Pt started cardiac rehab today.  Pt tolerated light exercise without difficulty. VSS, telemetry-Sinus Rhythm, t wave inversion. asymptomatic.  Medication list reconciled. Pt denies barriers to medicaiton compliance.  PSYCHOSOCIAL ASSESSMENT:  PHQ-0. Pt exhibits positive coping skills, hopeful outlook with supportive family. No psychosocial needs identified at this time, no psychosocial interventions necessary.    Pt enjoys going  to the beach, playing golf and pickelball and going to visit his daughter in [place].   Pt oriented to exercise equipment and routine.    Understanding verbalized. Thayer Headings RN BSN    Dr. Armanda Magic is Medical Director for Cardiac Rehab at [facility].

## 2023-08-08 ENCOUNTER — Encounter (HOSPITAL_COMMUNITY)
Admission: RE | Admit: 2023-08-08 | Discharge: 2023-08-08 | Disposition: A | Discharge status: Home / Self Care | Payer: Medicare Other | Source: Ambulatory Visit | Attending: Cardiology | Admitting: Cardiology

## 2023-08-08 DIAGNOSIS — I2102 ST elevation (STEMI) myocardial infarction involving left anterior descending coronary artery: Secondary | ICD-10-CM

## 2023-08-08 DIAGNOSIS — I252 Old myocardial infarction: Secondary | ICD-10-CM | POA: Diagnosis not present

## 2023-08-08 DIAGNOSIS — Z955 Presence of coronary angioplasty implant and graft: Secondary | ICD-10-CM

## 2023-08-09 ENCOUNTER — Ambulatory Visit: Payer: Medicare Other | Admitting: Family Medicine

## 2023-08-10 ENCOUNTER — Telehealth (HOSPITAL_COMMUNITY): Payer: Self-pay | Admitting: *Deleted

## 2023-08-10 ENCOUNTER — Encounter (HOSPITAL_COMMUNITY): Admission: RE | Admit: 2023-08-10 | Payer: Medicare Other | Source: Ambulatory Visit

## 2023-08-10 NOTE — Telephone Encounter (Signed)
 Rocky Link said that he would not be able to attend exercise today but will return on Monday.Thayer Headings RN BSN

## 2023-08-13 ENCOUNTER — Encounter (HOSPITAL_COMMUNITY)
Admission: RE | Admit: 2023-08-13 | Discharge: 2023-08-13 | Disposition: A | Discharge status: Home / Self Care | Payer: Medicare Other | Source: Ambulatory Visit | Attending: Cardiology | Admitting: Cardiology

## 2023-08-13 DIAGNOSIS — Z955 Presence of coronary angioplasty implant and graft: Secondary | ICD-10-CM

## 2023-08-13 DIAGNOSIS — I252 Old myocardial infarction: Secondary | ICD-10-CM | POA: Diagnosis not present

## 2023-08-13 DIAGNOSIS — I2102 ST elevation (STEMI) myocardial infarction involving left anterior descending coronary artery: Secondary | ICD-10-CM

## 2023-08-15 ENCOUNTER — Encounter (HOSPITAL_COMMUNITY)
Admission: RE | Admit: 2023-08-15 | Discharge: 2023-08-15 | Disposition: A | Discharge status: Home / Self Care | Payer: Medicare Other | Source: Ambulatory Visit | Attending: Cardiology | Admitting: Cardiology

## 2023-08-15 DIAGNOSIS — I2102 ST elevation (STEMI) myocardial infarction involving left anterior descending coronary artery: Secondary | ICD-10-CM

## 2023-08-15 DIAGNOSIS — I252 Old myocardial infarction: Secondary | ICD-10-CM | POA: Diagnosis not present

## 2023-08-15 DIAGNOSIS — Z955 Presence of coronary angioplasty implant and graft: Secondary | ICD-10-CM

## 2023-08-17 ENCOUNTER — Encounter (HOSPITAL_COMMUNITY)
Admission: RE | Admit: 2023-08-17 | Discharge: 2023-08-17 | Disposition: A | Discharge status: Home / Self Care | Payer: Medicare Other | Source: Ambulatory Visit | Attending: Cardiology | Admitting: Cardiology

## 2023-08-17 DIAGNOSIS — Z955 Presence of coronary angioplasty implant and graft: Secondary | ICD-10-CM

## 2023-08-17 DIAGNOSIS — I252 Old myocardial infarction: Secondary | ICD-10-CM | POA: Diagnosis not present

## 2023-08-17 DIAGNOSIS — I2102 ST elevation (STEMI) myocardial infarction involving left anterior descending coronary artery: Secondary | ICD-10-CM

## 2023-08-17 NOTE — Progress Notes (Signed)
 Reviewed home exercise guidelines with Rocky Link including endpoints, temperature precautions, target heart rate and rate of perceived exertion. Rocky Link is currently walking ~1.5 miles in about 30 minutes as his mode of home exercise on the days he doesn't attend cardiac rehab. He is also using hand weights of 12 lbs at home for his resistance training. His goal is to get back to playing golf and pickle ball. Rocky Link voices understanding of instructions given.  Artist Pais, MS, ACSM CEP

## 2023-08-20 ENCOUNTER — Encounter (HOSPITAL_COMMUNITY)
Admission: RE | Admit: 2023-08-20 | Discharge: 2023-08-20 | Disposition: A | Discharge status: Home / Self Care | Payer: Medicare Other | Source: Ambulatory Visit | Attending: Cardiology | Admitting: Cardiology

## 2023-08-20 DIAGNOSIS — I2102 ST elevation (STEMI) myocardial infarction involving left anterior descending coronary artery: Secondary | ICD-10-CM

## 2023-08-20 DIAGNOSIS — I252 Old myocardial infarction: Secondary | ICD-10-CM | POA: Diagnosis not present

## 2023-08-20 DIAGNOSIS — Z955 Presence of coronary angioplasty implant and graft: Secondary | ICD-10-CM

## 2023-08-21 ENCOUNTER — Other Ambulatory Visit: Payer: Self-pay | Admitting: Cardiology

## 2023-08-22 ENCOUNTER — Encounter (HOSPITAL_COMMUNITY)
Admission: RE | Admit: 2023-08-22 | Discharge: 2023-08-22 | Disposition: A | Discharge status: Home / Self Care | Payer: Medicare Other | Source: Ambulatory Visit | Attending: Cardiology | Admitting: Cardiology

## 2023-08-22 DIAGNOSIS — Z955 Presence of coronary angioplasty implant and graft: Secondary | ICD-10-CM

## 2023-08-22 DIAGNOSIS — I252 Old myocardial infarction: Secondary | ICD-10-CM | POA: Diagnosis not present

## 2023-08-22 DIAGNOSIS — I2102 ST elevation (STEMI) myocardial infarction involving left anterior descending coronary artery: Secondary | ICD-10-CM

## 2023-08-24 ENCOUNTER — Encounter (HOSPITAL_COMMUNITY)
Admission: RE | Admit: 2023-08-24 | Discharge: 2023-08-24 | Disposition: A | Discharge status: Home / Self Care | Payer: Medicare Other | Source: Ambulatory Visit | Attending: Cardiology | Admitting: Cardiology

## 2023-08-24 DIAGNOSIS — I2102 ST elevation (STEMI) myocardial infarction involving left anterior descending coronary artery: Secondary | ICD-10-CM

## 2023-08-24 DIAGNOSIS — Z955 Presence of coronary angioplasty implant and graft: Secondary | ICD-10-CM

## 2023-08-24 DIAGNOSIS — I252 Old myocardial infarction: Secondary | ICD-10-CM | POA: Diagnosis not present

## 2023-08-27 ENCOUNTER — Encounter (HOSPITAL_COMMUNITY)
Admission: RE | Admit: 2023-08-27 | Discharge: 2023-08-27 | Disposition: A | Discharge status: Home / Self Care | Payer: Medicare Other | Source: Ambulatory Visit | Attending: Cardiology | Admitting: Cardiology

## 2023-08-27 DIAGNOSIS — Z955 Presence of coronary angioplasty implant and graft: Secondary | ICD-10-CM

## 2023-08-27 DIAGNOSIS — I252 Old myocardial infarction: Secondary | ICD-10-CM | POA: Diagnosis not present

## 2023-08-27 DIAGNOSIS — I2102 ST elevation (STEMI) myocardial infarction involving left anterior descending coronary artery: Secondary | ICD-10-CM

## 2023-08-27 NOTE — Progress Notes (Signed)
 Cardiac Individual Treatment Plan  Patient Details  Name: Terry Kerr MRN: [id number] Date of Birth: Dec 12, 1955 Referring Provider:   Flowsheet Row INTENSIVE CARDIAC REHAB ORIENT from 07/30/2023 in [facility]  Referring Provider Rollene Rotunda, MD       Initial Encounter Date:  Flowsheet Row INTENSIVE CARDIAC REHAB ORIENT from 07/30/2023 in [facility]  Date 07/30/23       Visit Diagnosis: 07/16/23 STEMI  07/16/23 DES LAD  Patient's Home Medications on Admission:  Current Outpatient Medications:    aspirin EC 81 MG tablet, Take 1 tablet (81 mg total) by mouth daily. Swallow whole., Disp: 90 tablet, Rfl: 2   atorvastatin (LIPITOR) 80 MG tablet, Take 1 tablet (80 mg total) by mouth daily., Disp: 90 tablet, Rfl: 1   carvedilol (COREG) 6.25 MG tablet, Take 1 tablet (6.25 mg total) by mouth 2 (two) times daily with a meal., Disp: 60 tablet, Rfl: 2   Cholecalciferol (VITAMIN D) 2000 UNITS tablet, Take 2,000 Units by mouth daily., Disp: , Rfl:    fexofenadine (ALLEGRA) 30 MG tablet, Take 30 mg by mouth daily as needed (for allergies). , Disp: , Rfl:    HUMIRA, 2 PEN, 40 MG/0.4ML pen, INJECT 1 PEN UNDER THE SKIN EVERY 14 DAYS., Disp: 2 each, Rfl: 3   hyoscyamine (LEVSIN SL) 0.125 MG SL tablet, Take 1-2 tablet every 4-6 hours as needed for abdominal pain/spasm, Disp: 120 tablet, Rfl: 1   losartan (COZAAR) 25 MG tablet, Take 1 tablet (25 mg total) by mouth daily., Disp: 90 tablet, Rfl: 1   nitroGLYCERIN (NITROSTAT) 0.4 MG SL tablet, Place 1 tablet (0.4 mg total) under the tongue every 5 (five) minutes as needed for chest pain (Do not give more than 3 SL tablets in 15 minutes.)., Disp: 25 tablet, Rfl: 2   prasugrel (EFFIENT) 10 MG TABS tablet, TAKE 1 TABLET(10 MG) BY MOUTH DAILY, Disp: 90 tablet, Rfl: 3   spironolactone (ALDACTONE) 25 MG tablet, Take 0.5 tablets (12.5 mg total)  by mouth daily., Disp: 30 tablet, Rfl: 1  Past Medical History: Past Medical History:  Diagnosis Date   Allergic rhinitis    pollen, animal hair, dust mites   Allergy    Crohn disease (HCC)    Diverticulosis    Hemorrhoids, internal, thrombosed s/p resection/pexy 12/06/2012 12/04/2012   HLD (hyperlipidemia)    Obesity    Regional enteritis (Crohn's disease) (HCC) 08/01/2007   Qualifier: Diagnosis of  By: Hetty Ely MD, Franne Grip    Small bowel obstruction [facility])     Tobacco Use: Social History   Tobacco Use  Smoking Status Never  Smokeless Tobacco Never    Labs: Review Flowsheet  More data exists      Latest Ref Rng & Units 12/19/2007 12/04/2008 02/23/2010 04/26/2010 07/16/2023  Labs for ITP Cardiac and Pulmonary Rehab  Cholestrol 0 - 200 mg/dL 213  086  - 578  469   LDL (calc) 0 - 99 mg/dL 629  528  - - 413   Direct LDL mg/dL - - - 244.0  -  HDL-C >10 mg/dL 27.2  53.66  - 44.03  49   Trlycerides <150 mg/dL 474  259.5  - 638.7  564   Hemoglobin A1c 4.8 - 5.6 % - - - - 5.0   TCO2 0 - 100 mmol/L - - 22  - -    Capillary Blood Glucose: No  results found for: "GLUCAP"   Exercise Target Goals: Exercise Program Goal: Individual exercise prescription set using results from initial 6 min walk test and THRR while considering  patient's activity barriers and safety.   Exercise Prescription Goal: Initial exercise prescription builds to 30-45 minutes a day of aerobic activity, 2-3 days per week.  Home exercise guidelines will be given to patient during program as part of exercise prescription that the participant will acknowledge.  Activity Barriers & Risk Stratification:  Activity Barriers & Cardiac Risk Stratification - 07/30/23 1125       Activity Barriers & Cardiac Risk Stratification   Activity Barriers Neck/Spine Problems;Joint Problems    Cardiac Risk Stratification High   <5 METs on            6 Minute Walk:  6 Minute Walk     Row Name 07/30/23 1224          6 Minute Walk   Phase Initial     Distance 1860 feet     Walk Time 6 minutes     # of Rest Breaks 0     MPH 3.52     METS 4.03     RPE 9     Perceived Dyspnea  0     VO2 Peak 14.1     Symptoms No     Resting HR 57 bpm     Resting BP 112/70     Resting Oxygen Saturation  99 %     Exercise Oxygen Saturation  during 6 min walk 99 %     Max Ex. HR 97 bpm     Max Ex. BP 128/68     2 Minute Post BP 118/66              Oxygen Initial Assessment:   Oxygen Re-Evaluation:   Oxygen Discharge (Final Oxygen Re-Evaluation):   Initial Exercise Prescription:  Initial Exercise Prescription - 07/30/23 1200       Date of Initial Exercise RX and Referring Provider   Date 07/30/23    Referring Provider Rollene Rotunda, MD    Expected Discharge Date 10/24/23      Treadmill   MPH 3.2    Grade 0    Minutes 15    METs 3.5      Recumbant Elliptical   Level 2    RPM 60    Watts 80    Minutes 15    METs 3.5      Prescription Details   Frequency (times per week) 3    Duration Progress to 30 minutes of continuous aerobic without signs/symptoms of physical distress      Intensity   THRR 40-80% of Max Heartrate 61-122    Ratings of Perceived Exertion 11-13    Perceived Dyspnea 0-4      Progression   Progression Continue progressive overload as per policy without signs/symptoms or physical distress.      Resistance Training   Training Prescription Yes    Weight 4    Reps 10-15             Perform Capillary Blood Glucose checks as needed.  Exercise Prescription Changes:   Exercise Prescription Changes     Row Name 08/06/23 1025 08/17/23 1023 08/27/23 1025         Response to Exercise   Blood Pressure (Admit) 102/54 110/56 108/60     Blood Pressure (Exercise) 122/60 128/80 --     Blood Pressure (Exit) 104/62 100/58  96/58     Heart Rate (Admit) 60 bpm 58 bpm 53 bpm     Heart Rate (Exercise) 108 bpm 101 bpm 86 bpm     Heart Rate (Exit) 67 bpm 67 bpm 61 bpm      Rating of Perceived Exertion (Exercise) 11 11 12      Symptoms None None None     Comments Off to a good start with exercise. Reviewed home exercise guidelines and goals with Terry Kerr. Increased weights today. Reviewed METs with Terry Kerr. Increased workload on recumbent elliptical today.     Duration Continue with 30 min of aerobic exercise without signs/symptoms of physical distress. Continue with 30 min of aerobic exercise without signs/symptoms of physical distress. Continue with 30 min of aerobic exercise without signs/symptoms of physical distress.     Intensity THRR unchanged THRR unchanged THRR unchanged       Progression   Progression Continue to progress workloads to maintain intensity without signs/symptoms of physical distress. Continue to progress workloads to maintain intensity without signs/symptoms of physical distress. Continue to progress workloads to maintain intensity without signs/symptoms of physical distress.     Average METs 3.5 5.3 4.7       Resistance Training   Training Prescription Yes Yes Yes     Weight 4 lbs 8 lbs 8 lbs     Reps 10-15 10-15 10-15     Time 10 Minutes 10 Minutes 10 Minutes       Interval Training   Interval Training No No No       Treadmill   MPH 3 3.5 3.2     Grade 0 3 3.5     Minutes 15 15 15      METs 3.3 5.13 4.99       Recumbant Elliptical   Level 2 2 4      RPM 58 69 69     Watts 75 66 92     Minutes 15 15 15      METs 3.7 5.5 4.4       Home Exercise Plan   Plans to continue exercise at -- Home (comment)  Walking Home (comment)  Walking     Frequency -- Add 4 additional days to program exercise sessions. Add 4 additional days to program exercise sessions.     Initial Home Exercises Provided -- 08/17/23 08/17/23              Exercise Comments:   Exercise Comments     Row Name 08/06/23 1127 08/17/23 1026 08/27/23 1102       Exercise Comments Ken tolerated first session of exercise well without symptoms. Oriented to  warm-up/cool-down and exercise routine. Reviewed home exercise guidelines and goals with Terry Kerr. Reviewed METs with Terry Kerr.              Exercise Goals and Review:   Exercise Goals     Row Name 07/30/23 1126             Exercise Goals   Increase Physical Activity Yes       Intervention Provide advice, education, support and counseling about physical activity/exercise needs.;Develop an individualized exercise prescription for aerobic and resistive training based on initial evaluation findings, risk stratification, comorbidities and participant's personal goals.       Expected Outcomes Short Term: Attend rehab on a regular basis to increase amount of physical activity.;Long Term: Exercising regularly at least 3-5 days a week.;Long Term: Add in home exercise to make exercise part of routine and to  increase amount of physical activity.       Increase Strength and Stamina Yes       Intervention Provide advice, education, support and counseling about physical activity/exercise needs.;Develop an individualized exercise prescription for aerobic and resistive training based on initial evaluation findings, risk stratification, comorbidities and participant's personal goals.       Expected Outcomes Short Term: Increase workloads from initial exercise prescription for resistance, speed, and METs.;Long Term: Improve cardiorespiratory fitness, muscular endurance and strength as measured by increased METs and functional capacity ( );Short Term: Perform resistance training exercises routinely during rehab and add in resistance training at home       Able to understand and use rate of perceived exertion (RPE) scale Yes       Intervention Provide education and explanation on how to use RPE scale       Expected Outcomes Long Term:  Able to use RPE to guide intensity level when exercising independently;Short Term: Able to use RPE daily in rehab to express subjective intensity level       Knowledge and  understanding of Target Heart Rate Range (THRR) Yes       Intervention Provide education and explanation of THRR including how the numbers were predicted and where they are located for reference       Expected Outcomes Short Term: Able to state/look up THRR;Short Term: Able to use daily as guideline for intensity in rehab;Long Term: Able to use THRR to govern intensity when exercising independently       Understanding of Exercise Prescription Yes       Intervention Provide education, explanation, and written materials on patient's individual exercise prescription       Expected Outcomes Short Term: Able to explain program exercise prescription;Long Term: Able to explain home exercise prescription to exercise independently                Exercise Goals Re-Evaluation :  Exercise Goals Re-Evaluation     Row Name 08/06/23 1127 08/17/23 1026           Exercise Goal Re-Evaluation   Exercise Goals Review Increase Physical Activity;Increase Strength and Stamina;Able to understand and use rate of perceived exertion (RPE) scale Increase Physical Activity;Increase Strength and Stamina;Able to understand and use rate of perceived exertion (RPE) scale;Knowledge and understanding of Target Heart Rate Range (THRR);Able to check pulse independently;Understanding of Exercise Prescription      Comments Terry Kerr was able to understand and use RPE scale appropriately. Reviewed exercise prescription with Terry Kerr. He is walking on the days he doesn't attend cardiac rehab and using 12 lb hand weights. Increased hand weights at cardiac rehab to 8 lbs and encouraged him to do weights on non-consecutive days. He is able to count his pulse.      Expected Outcomes Progress workloads as tolerated to help increase strength and stamina. Terry Kerr will continue daily exercise routine to help improve cardiorespiratory fitness.               Discharge Exercise Prescription (Final Exercise Prescription Changes):  Exercise  Prescription Changes - 08/27/23 1025       Response to Exercise   Blood Pressure (Admit) 108/60    Blood Pressure (Exit) 96/58    Heart Rate (Admit) 53 bpm    Heart Rate (Exercise) 86 bpm    Heart Rate (Exit) 61 bpm    Rating of Perceived Exertion (Exercise) 12    Symptoms None    Comments Reviewed METs with Terry Kerr. Increased workload  on recumbent elliptical today.    Duration Continue with 30 min of aerobic exercise without signs/symptoms of physical distress.    Intensity THRR unchanged      Progression   Progression Continue to progress workloads to maintain intensity without signs/symptoms of physical distress.    Average METs 4.7      Resistance Training   Training Prescription Yes    Weight 8 lbs    Reps 10-15    Time 10 Minutes      Interval Training   Interval Training No      Treadmill   MPH 3.2    Grade 3.5    Minutes 15    METs 4.99      Recumbant Elliptical   Level 4    RPM 69    Watts 92    Minutes 15    METs 4.4      Home Exercise Plan   Plans to continue exercise at Home (comment)   Walking   Frequency Add 4 additional days to program exercise sessions.    Initial Home Exercises Provided 08/17/23             Nutrition:  Target Goals: Understanding of nutrition guidelines, daily intake of sodium 1500mg , cholesterol 200mg , calories 30% from fat and 7% or less from saturated fats, daily to have 5 or more servings of fruits and vegetables.  Biometrics:  Pre Biometrics - 07/30/23 1123       Pre Biometrics   Waist Circumference 40 inches    Hip Circumference 39 inches    Waist to Hip Ratio 1.03 %    Triceps Skinfold 12 mm    % Body Fat 26.4 %    Grip Strength 40 kg    Flexibility 0 in   could not reach   Single Leg Stand 23 seconds              Nutrition Therapy Plan and Nutrition Goals:  Nutrition Therapy & Goals - 08/06/23 1127       Nutrition Therapy   Diet Heart Healthy Diet    Drug/Food Interactions Statins/Certain Fruits       Personal Nutrition Goals   Nutrition Goal Patient to identify strategies for reducing cardiovascular risk by attending the [employer] education and nutrition series weekly.    Personal Goal #2 Patient to improve diet quality by using the plate method as a guide for meal planning to include lean protein/plant protein, fruits, vegetables, whole grains, nonfat dairy as part of a well-balanced diet.    Comments Terry Kerr has medical history of STEMI, hyperlipidemia, diverticulosis. Lipids are not at goal; LDL 206. Terry Kerr reports that he enjoys cooking. Patient will benefit from participation in intensive cardiac rehab for nutrition, exercise, and lifestyle modification.      Intervention Plan   Intervention Prescribe, educate and counsel regarding individualized specific dietary modifications aiming towards targeted core components such as weight, hypertension, lipid management, diabetes, heart failure and other comorbidities.;Nutrition handout(s) given to patient.    Expected Outcomes Short Term Goal: Understand basic principles of dietary content, such as calories, fat, sodium, cholesterol and nutrients.;Long Term Goal: Adherence to prescribed nutrition plan.             Nutrition Assessments:  MEDIFICTS Score Key: >=70 Need to make dietary changes  40-70 Heart Healthy Diet <= 40 Therapeutic Level Cholesterol Diet    Picture Your Plate Scores: <16 Unhealthy dietary pattern with much room for improvement. 41-50 Dietary pattern unlikely to meet recommendations  for good health and room for improvement. 51-60 More healthful dietary pattern, with some room for improvement.  >60 Healthy dietary pattern, although there may be some specific behaviors that could be improved.    Nutrition Goals Re-Evaluation:  Nutrition Goals Re-Evaluation     Row Name 08/06/23 1127             Goals   Current Weight 178 lb 5.6 oz (80.9 kg)       Comment cholesterol 289, triglycerides 172, LDL 206, Lpa WNL,  A1c WNL       Expected Outcome Terry Kerr has medical history of STEMI, hyperlipidemia, diverticulosis. Lipids are not at goal; LDL 206. Terry Kerr reports that he enjoys cooking. Patient will benefit from participation in intensive cardiac rehab for nutrition, exercise, and lifestyle modification.                Nutrition Goals Re-Evaluation:  Nutrition Goals Re-Evaluation     Row Name 08/06/23 1127             Goals   Current Weight 178 lb 5.6 oz (80.9 kg)       Comment cholesterol 289, triglycerides 172, LDL 206, Lpa WNL, A1c WNL       Expected Outcome Terry Kerr has medical history of STEMI, hyperlipidemia, diverticulosis. Lipids are not at goal; LDL 206. Terry Kerr reports that he enjoys cooking. Patient will benefit from participation in intensive cardiac rehab for nutrition, exercise, and lifestyle modification.                Nutrition Goals Discharge (Final Nutrition Goals Re-Evaluation):  Nutrition Goals Re-Evaluation - 08/06/23 1127       Goals   Current Weight 178 lb 5.6 oz (80.9 kg)    Comment cholesterol 289, triglycerides 172, LDL 206, Lpa WNL, A1c WNL    Expected Outcome Terry Kerr has medical history of STEMI, hyperlipidemia, diverticulosis. Lipids are not at goal; LDL 206. Terry Kerr reports that he enjoys cooking. Patient will benefit from participation in intensive cardiac rehab for nutrition, exercise, and lifestyle modification.             Psychosocial: Target Goals: Acknowledge presence or absence of significant depression and/or stress, maximize coping skills, provide positive support system. Participant is able to verbalize types and ability to use techniques and skills needed for reducing stress and depression.  Initial Review & Psychosocial Screening:  Initial Psych Review & Screening - 07/30/23 1126       Initial Review   Current issues with None Identified      Family Dynamics   Good Support System? Yes   Wife for support     Barriers   Psychosocial barriers to  participate in program There are no identifiable barriers or psychosocial needs.      Screening Interventions   Interventions Encouraged to exercise;Provide feedback about the scores to participant    Expected Outcomes Short Term goal: Identification and review with participant of any Quality of Life or Depression concerns found by scoring the questionnaire.;Long Term goal: The participant improves quality of Life and PHQ9 Scores as seen by post scores and/or verbalization of changes             Quality of Life Scores:  Quality of Life - 07/30/23 1226       Quality of Life   Select Quality of Life      Quality of Life Scores   Health/Function Pre 26.8 %    Socioeconomic Pre 29.64 %    Psych/Spiritual  Pre 28.93 %    Family Pre 30 %    GLOBAL Pre 28.29 %            Scores of 19 and below usually indicate a poorer quality of life in these areas.  A difference of  2-3 points is a clinically meaningful difference.  A difference of 2-3 points in the total score of the Quality of Life Index has been associated with significant improvement in overall quality of life, self-image, physical symptoms, and general health in studies assessing change in quality of life.  PHQ-9: Review Flowsheet       07/30/2023  Depression screen PHQ 2/9  Decreased Interest 0  Down, Depressed, Hopeless 0  PHQ - 2 Score 0  Altered sleeping 0  Tired, decreased energy 0  Change in appetite 0  Feeling bad or failure about yourself  0  Trouble concentrating 0  Moving slowly or fidgety/restless 0  Suicidal thoughts 0  PHQ-9 Score 0   Interpretation of Total Score  Total Score Depression Severity:  1-4 = Minimal depression, 5-9 = Mild depression, 10-14 = Moderate depression, 15-19 = Moderately severe depression, 20-27 = Severe depression   Psychosocial Evaluation and Intervention:   Psychosocial Re-Evaluation:  Psychosocial Re-Evaluation     Row Name 08/07/23 0802 08/28/23 8119            Psychosocial Re-Evaluation   Current issues with None Identified None Identified      Interventions Encouraged to attend Cardiac Rehabilitation for the exercise Encouraged to attend Cardiac Rehabilitation for the exercise      Continue Psychosocial Services  No Follow up required No Follow up required               Psychosocial Discharge (Final Psychosocial Re-Evaluation):  Psychosocial Re-Evaluation - 08/28/23 0823       Psychosocial Re-Evaluation   Current issues with None Identified    Interventions Encouraged to attend Cardiac Rehabilitation for the exercise    Continue Psychosocial Services  No Follow up required             Vocational Rehabilitation: Provide vocational rehab assistance to qualifying candidates.   Vocational Rehab Evaluation & Intervention:  Vocational Rehab - 07/30/23 1127       Initial Vocational Rehab Evaluation & Intervention   Assessment shows need for Vocational Rehabilitation No   Terry Kerr is retired            Education: Education Goals: Education classes will be provided on a weekly basis, covering required topics. Participant will state understanding/return demonstration of topics presented.    Education     Row Name 08/06/23 1300     Education   Cardiac Education Topics Pritikin   Geographical information systems officer Exercise   Exercise Workshop Exercise Basics: Building Your Action Plan   Instruction Review Code 1- Verbalizes Understanding   Class Start Time 1146   Class Stop Time 1235   Class Time Calculation (min) 49 min    Row Name 08/08/23 1600     Education   Cardiac Education Topics Pritikin   Customer service manager   Weekly Topic Efficiency Cooking - Meals in a Snap   Instruction Review Code 1- Verbalizes Understanding   Class Start Time 1145   Class Stop Time 1225   Class Time Calculation (min) 40 min    Row Name 08/13/23 1500  Education   Cardiac Education Topics Pritikin   Glass blower/designer Nutrition   Nutrition Workshop Targeting Your Nutrition Priorities   Instruction Review Code 1- Verbalizes Understanding   Class Start Time 1150   Class Stop Time 1222   Class Time Calculation (min) 32 min    Row Name 08/15/23 1500     Education   Cardiac Education Topics Pritikin   Secondary school teacher School   Educator Dietitian   Weekly Topic One-Pot Wonders   Instruction Review Code 1- Verbalizes Understanding   Class Start Time 1145   Class Stop Time 1227   Class Time Calculation (min) 42 min    Row Name 08/17/23 1100     Education   Cardiac Education Topics --   Select --     Core Videos   Educator --   Select --   General Education --   Instruction Review Code --    Row Name 08/20/23 1300     Education   Cardiac Education Topics Pritikin   Select Workshops     Workshops   Educator Exercise Physiologist   Select Psychosocial   Psychosocial Workshop Focused Goals, Sustainable Changes   Instruction Review Code 1- Verbalizes Understanding   Class Start Time 1144   Class Stop Time 1218   Class Time Calculation (min) 34 min    Row Name 08/22/23 1300     Education   Cardiac Education Topics Pritikin   Customer service manager   Weekly Topic Comforting Weekend Breakfasts   Instruction Review Code 1- Verbalizes Understanding   Class Start Time 1145   Class Stop Time 1228   Class Time Calculation (min) 43 min            Core Videos: Exercise    Move It!  Clinical staff conducted group or individual video education with verbal and written material and guidebook.  Patient learns the recommended Pritikin exercise program. Exercise with the goal of living a long, healthy life. Some of the health benefits of exercise include controlled diabetes, healthier blood pressure levels, improved  cholesterol levels, improved heart and lung capacity, improved sleep, and better body composition. Everyone should speak with their doctor before starting or changing an exercise routine.  Biomechanical Limitations Clinical staff conducted group or individual video education with verbal and written material and guidebook.  Patient learns how biomechanical limitations can impact exercise and how we can mitigate and possibly overcome limitations to have an impactful and balanced exercise routine.  Body Composition Clinical staff conducted group or individual video education with verbal and written material and guidebook.  Patient learns that body composition (ratio of muscle mass to fat mass) is a key component to assessing overall fitness, rather than body weight alone. Increased fat mass, especially visceral belly fat, can put [place] at increased risk for metabolic syndrome, type 2 diabetes, heart disease, and even death. It is recommended to combine diet and exercise (cardiovascular and resistance training) to improve your body composition. Seek guidance from your physician and exercise physiologist before implementing an exercise routine.  Exercise Action Plan Clinical staff conducted group or individual video education with verbal and written material and guidebook.  Patient learns the recommended strategies to achieve and enjoy long-term exercise adherence, including variety, self-motivation, self-efficacy, and positive decision making. Benefits of exercise include fitness, good health, weight management,  more energy, better sleep, less stress, and overall well-being.  Medical   Heart Disease Risk Reduction Clinical staff conducted group or individual video education with verbal and written material and guidebook.  Patient learns our heart is our most vital organ as it circulates oxygen, nutrients, white blood cells, and hormones throughout the entire body, and carries waste away. Data supports a  plant-based eating plan like the Pritikin Program for its effectiveness in slowing progression of and reversing heart disease. The video provides a number of recommendations to address heart disease.   Metabolic Syndrome and Belly Fat  Clinical staff conducted group or individual video education with verbal and written material and guidebook.  Patient learns what metabolic syndrome is, how it leads to heart disease, and how one can reverse it and keep it from coming back. You have metabolic syndrome if you have 3 of the following 5 criteria: abdominal obesity, high blood pressure, high triglycerides, low HDL cholesterol, and high blood sugar.  Hypertension and Heart Disease Clinical staff conducted group or individual video education with verbal and written material and guidebook.  Patient learns that high blood pressure, or hypertension, is very common in the [place]. Hypertension is largely due to excessive salt intake, but other important risk factors include being overweight, physical inactivity, drinking too much alcohol, smoking, and not eating enough potassium from fruits and vegetables. High blood pressure is a leading risk factor for heart attack, stroke, congestive heart failure, dementia, kidney failure, and premature death. Long-term effects of excessive salt intake include stiffening of the arteries and thickening of heart muscle and organ damage. Recommendations include ways to reduce hypertension and the risk of heart disease.  Diseases of Our Time - Focusing on Diabetes Clinical staff conducted group or individual video education with verbal and written material and guidebook.  Patient learns why the best way to stop diseases of our time is prevention, through food and other lifestyle changes. Medicine (such as prescription pills and surgeries) is often only a Band-Aid on the problem, not a long-term solution. Most common diseases of our time include obesity, type 2 diabetes,  hypertension, heart disease, and cancer. The Pritikin Program is recommended and has been proven to help reduce, reverse, and/or prevent the damaging effects of metabolic syndrome.  Nutrition   Overview of the Pritikin Eating Plan  Clinical staff conducted group or individual video education with verbal and written material and guidebook.  Patient learns about the Pritikin Eating Plan for disease risk reduction. The Pritikin Eating Plan emphasizes a wide variety of unrefined, minimally-processed carbohydrates, like fruits, vegetables, whole grains, and legumes. Go, Caution, and Stop food choices are explained. Plant-based and lean animal proteins are emphasized. Rationale provided for low sodium intake for blood pressure control, low added sugars for blood sugar stabilization, and low added fats and oils for coronary artery disease risk reduction and weight management.  Calorie Density  Clinical staff conducted group or individual video education with verbal and written material and guidebook.  Patient learns about calorie density and how it impacts the Pritikin Eating Plan. Knowing the characteristics of the food you choose will help you decide whether those foods will lead to weight gain or weight loss, and whether you want to consume more or less of them. Weight loss is usually a side effect of the Pritikin Eating Plan because of its focus on low calorie-dense foods.  Label Reading  Clinical staff conducted group or individual video education with verbal and written material and guidebook.  Patient learns about the Pritikin recommended label reading guidelines and corresponding recommendations regarding calorie density, added sugars, sodium content, and whole grains.  Dining Out - Part 1  Clinical staff conducted group or individual video education with verbal and written material and guidebook.  Patient learns that restaurant meals can be sabotaging because they can be so high in calories, fat,  sodium, and/or sugar. Patient learns recommended strategies on how to positively address this and avoid unhealthy pitfalls.  Facts on Fats  Clinical staff conducted group or individual video education with verbal and written material and guidebook.  Patient learns that lifestyle modifications can be just as effective, if not more so, as many medications for lowering your risk of heart disease. A Pritikin lifestyle can help to reduce your risk of inflammation and atherosclerosis (cholesterol build-up, or plaque, in the artery walls). Lifestyle interventions such as dietary choices and physical activity address the cause of atherosclerosis. A review of the types of fats and their impact on blood cholesterol levels, along with dietary recommendations to reduce fat intake is also included.  Nutrition Action Plan  Clinical staff conducted group or individual video education with verbal and written material and guidebook.  Patient learns how to incorporate Pritikin recommendations into their lifestyle. Recommendations include planning and keeping personal health goals in mind as an important part of their success.  Healthy Mind-Set    Healthy Minds, Bodies, Hearts  Clinical staff conducted group or individual video education with verbal and written material and guidebook.  Patient learns how to identify when they are stressed. Video will discuss the impact of that stress, as well as the many benefits of stress management. Patient will also be introduced to stress management techniques. The way we think, act, and feel has an impact on our hearts.  How Our Thoughts Can Heal Our Hearts  Clinical staff conducted group or individual video education with verbal and written material and guidebook.  Patient learns that negative thoughts can cause depression and anxiety. This can result in negative lifestyle behavior and serious health problems. Cognitive behavioral therapy is an effective method to help control  our thoughts in order to change and improve our emotional outlook.  Additional Videos:  Exercise    Improving Performance  Clinical staff conducted group or individual video education with verbal and written material and guidebook.  Patient learns to use a non-linear approach by alternating intensity levels and lengths of time spent exercising to help burn more calories and lose more body fat. Cardiovascular exercise helps improve heart health, metabolism, hormonal balance, blood sugar control, and recovery from fatigue. Resistance training improves strength, endurance, balance, coordination, reaction time, metabolism, and muscle mass. Flexibility exercise improves circulation, posture, and balance. Seek guidance from your physician and exercise physiologist before implementing an exercise routine and learn your capabilities and proper form for all exercise.  Introduction to Yoga  Clinical staff conducted group or individual video education with verbal and written material and guidebook.  Patient learns about yoga, a discipline of the coming together of mind, breath, and body. The benefits of yoga include improved flexibility, improved range of motion, better posture and core strength, increased lung function, weight loss, and positive self-image. Yoga's heart health benefits include lowered blood pressure, healthier heart rate, decreased cholesterol and triglyceride levels, improved immune function, and reduced stress. Seek guidance from your physician and exercise physiologist before implementing an exercise routine and learn your capabilities and proper form for all exercise.  Medical   Aging: Enhancing  Your Quality of Life  Clinical staff conducted group or individual video education with verbal and written material and guidebook.  Patient learns key strategies and recommendations to stay in good physical health and enhance quality of life, such as prevention strategies, having an advocate,  securing a Health Care Proxy and Power of Attorney, and keeping a list of medications and system for tracking them. It also discusses how to avoid risk for bone loss.  Biology of Weight Control  Clinical staff conducted group or individual video education with verbal and written material and guidebook.  Patient learns that weight gain occurs because we consume more calories than we burn (eating more, moving less). Even if your body weight is normal, you may have higher ratios of fat compared to muscle mass. Too much body fat puts you at increased risk for cardiovascular disease, heart attack, stroke, type 2 diabetes, and obesity-related cancers. In addition to exercise, following the Pritikin Eating Plan can help reduce your risk.  Decoding Lab Results  Clinical staff conducted group or individual video education with verbal and written material and guidebook.  Patient learns that lab test reflects one measurement whose values change over time and are influenced by many factors, including medication, stress, sleep, exercise, food, hydration, pre-existing medical conditions, and more. It is recommended to use the knowledge from this video to become more involved with your lab results and evaluate your numbers to speak with your doctor.   Diseases of Our Time - Overview  Clinical staff conducted group or individual video education with verbal and written material and guidebook.  Patient learns that according to the CDC, 50% to 70% of chronic diseases (such as obesity, type 2 diabetes, elevated lipids, hypertension, and heart disease) are avoidable through lifestyle improvements including healthier food choices, listening to satiety cues, and increased physical activity.  Sleep Disorders Clinical staff conducted group or individual video education with verbal and written material and guidebook.  Patient learns how good quality and duration of sleep are important to overall health and well-being.  Patient also learns about sleep disorders and how they impact health along with recommendations to address them, including discussing with a physician.  Nutrition  Dining Out - Part 2 Clinical staff conducted group or individual video education with verbal and written material and guidebook.  Patient learns how to plan ahead and communicate in order to maximize their dining experience in a healthy and nutritious manner. Included are recommended food choices based on the type of restaurant the patient is visiting.   Fueling a Banker conducted group or individual video education with verbal and written material and guidebook.  There is a strong connection between our food choices and our health. Diseases like obesity and type 2 diabetes are very prevalent and are in large-part due to lifestyle choices. The Pritikin Eating Plan provides plenty of food and hunger-curbing satisfaction. It is easy to follow, affordable, and helps reduce health risks.  Menu Workshop  Clinical staff conducted group or individual video education with verbal and written material and guidebook.  Patient learns that restaurant meals can sabotage health goals because they are often packed with calories, fat, sodium, and sugar. Recommendations include strategies to plan ahead and to communicate with the manager, chef, or server to help order a healthier meal.  Planning Your Eating Strategy  Clinical staff conducted group or individual video education with verbal and written material and guidebook.  Patient learns about the Pritikin Eating Plan and its  benefit of reducing the risk of disease. The Pritikin Eating Plan does not focus on calories. Instead, it emphasizes high-quality, nutrient-rich foods. By knowing the characteristics of the foods, we choose, we can determine their calorie density and make informed decisions.  Targeting Your Nutrition Priorities  Clinical staff conducted group or individual  video education with verbal and written material and guidebook.  Patient learns that lifestyle habits have a tremendous impact on disease risk and progression. This video provides eating and physical activity recommendations based on your personal health goals, such as reducing LDL cholesterol, losing weight, preventing or controlling type 2 diabetes, and reducing high blood pressure.  Vitamins and Minerals  Clinical staff conducted group or individual video education with verbal and written material and guidebook.  Patient learns different ways to obtain key vitamins and minerals, including through a recommended healthy diet. It is important to discuss all supplements you take with your doctor.   Healthy Mind-Set    Smoking Cessation  Clinical staff conducted group or individual video education with verbal and written material and guidebook.  Patient learns that cigarette smoking and tobacco addiction pose a serious health risk which affects millions of people. Stopping smoking will significantly reduce the risk of heart disease, lung disease, and many forms of cancer. Recommended strategies for quitting are covered, including working with your doctor to develop a successful plan.  Culinary   Becoming a Set designer conducted group or individual video education with verbal and written material and guidebook.  Patient learns that cooking at home can be healthy, cost-effective, quick, and puts them in control. Keys to cooking healthy recipes will include looking at your recipe, assessing your equipment needs, planning ahead, making it simple, choosing cost-effective seasonal ingredients, and limiting the use of added fats, salts, and sugars.  Cooking - Breakfast and Snacks  Clinical staff conducted group or individual video education with verbal and written material and guidebook.  Patient learns how important breakfast is to satiety and nutrition through the entire day.  Recommendations include key foods to eat during breakfast to help stabilize blood sugar levels and to prevent overeating at meals later in the day. Planning ahead is also a key component.  Cooking - Educational psychologist conducted group or individual video education with verbal and written material and guidebook.  Patient learns eating strategies to improve overall health, including an approach to cook more at home. Recommendations include thinking of animal protein as a side on your plate rather than center stage and focusing instead on lower calorie dense options like vegetables, fruits, whole grains, and plant-based proteins, such as beans. Making sauces in large quantities to freeze for later and leaving the skin on your vegetables are also recommended to maximize your experience.  Cooking - Healthy Salads and Dressing Clinical staff conducted group or individual video education with verbal and written material and guidebook.  Patient learns that vegetables, fruits, whole grains, and legumes are the foundations of the Pritikin Eating Plan. Recommendations include how to incorporate each of these in flavorful and healthy salads, and how to create homemade salad dressings. Proper handling of ingredients is also covered. Cooking - Soups and [employer] - Soups and Desserts Clinical staff conducted group or individual video education with verbal and written material and guidebook.  Patient learns that Pritikin soups and desserts make for easy, nutritious, and delicious snacks and meal components that are low in sodium, fat, sugar, and calorie density, while high  in vitamins, minerals, and filling fiber. Recommendations include simple and healthy ideas for soups and desserts.   Overview     The Pritikin Solution Program Overview Clinical staff conducted group or individual video education with verbal and written material and guidebook.  Patient learns that the results of the Pritikin  Program have been documented in more than 100 articles published in peer-reviewed journals, and the benefits include reducing risk factors for (and, in some cases, even reversing) high cholesterol, high blood pressure, type 2 diabetes, obesity, and more! An overview of the three key pillars of the Pritikin Program will be covered: eating well, doing regular exercise, and having a healthy mind-set.  WORKSHOPS  Exercise: Exercise Basics: Building Your Action Plan Clinical staff led group instruction and group discussion with PowerPoint presentation and patient guidebook. To enhance the learning environment the use of posters, models and videos may be added. At the conclusion of this workshop, patients will comprehend the difference between physical activity and exercise, as well as the benefits of incorporating both, into their routine. Patients will understand the FITT (Frequency, Intensity, Time, and Type) principle and how to use it to build an exercise action plan. In addition, safety concerns and other considerations for exercise and cardiac rehab will be addressed by the presenter. The purpose of this lesson is to promote a comprehensive and effective weekly exercise routine in order to improve patients' overall level of fitness.   Managing Heart Disease: Your Path to a Healthier Heart Clinical staff led group instruction and group discussion with PowerPoint presentation and patient guidebook. To enhance the learning environment the use of posters, models and videos may be added.At the conclusion of this workshop, patients will understand the anatomy and physiology of the heart. Additionally, they will understand how Pritikin's three pillars impact the risk factors, the progression, and the management of heart disease.  The purpose of this lesson is to provide a high-level overview of the heart, heart disease, and how the Pritikin lifestyle positively impacts risk factors.  Exercise  Biomechanics Clinical staff led group instruction and group discussion with PowerPoint presentation and patient guidebook. To enhance the learning environment the use of posters, models and videos may be added. Patients will learn how the structural parts of their bodies function and how these functions impact their daily activities, movement, and exercise. Patients will learn how to promote a neutral spine, learn how to manage pain, and identify ways to improve their physical movement in order to promote healthy living. The purpose of this lesson is to expose patients to common physical limitations that impact physical activity. Participants will learn practical ways to adapt and manage aches and pains, and to minimize their effect on regular exercise. Patients will learn how to maintain good posture while sitting, walking, and lifting.  Balance Training and Fall Prevention  Clinical staff led group instruction and group discussion with PowerPoint presentation and patient guidebook. To enhance the learning environment the use of posters, models and videos may be added. At the conclusion of this workshop, patients will understand the importance of their sensorimotor skills (vision, proprioception, and the vestibular system) in maintaining their ability to balance as they age. Patients will apply a variety of balancing exercises that are appropriate for their current level of function. Patients will understand the common causes for poor balance, possible solutions to these problems, and ways to modify their physical environment in order to minimize their fall risk. The purpose of this lesson is to teach patients about  the importance of maintaining balance as they age and ways to minimize their risk of falling.  WORKSHOPS   Nutrition:  Fueling a Ship broker led group instruction and group discussion with PowerPoint presentation and patient guidebook. To enhance the learning  environment the use of posters, models and videos may be added. Patients will review the foundational principles of the Pritikin Eating Plan and understand what constitutes a serving size in each of the food groups. Patients will also learn Pritikin-friendly foods that are better choices when away from home and review make-ahead meal and snack options. Calorie density will be reviewed and applied to three nutrition priorities: weight maintenance, weight loss, and weight gain. The purpose of this lesson is to reinforce (in a group setting) the key concepts around what patients are recommended to eat and how to apply these guidelines when away from home by planning and selecting Pritikin-friendly options. Patients will understand how calorie density may be adjusted for different weight management goals.  Mindful Eating  Clinical staff led group instruction and group discussion with PowerPoint presentation and patient guidebook. To enhance the learning environment the use of posters, models and videos may be added. Patients will briefly review the concepts of the Pritikin Eating Plan and the importance of low-calorie dense foods. The concept of mindful eating will be introduced as well as the importance of paying attention to internal hunger signals. Triggers for non-hunger eating and techniques for dealing with triggers will be explored. The purpose of this lesson is to provide patients with the opportunity to review the basic principles of the Pritikin Eating Plan, discuss the value of eating mindfully and how to measure internal cues of hunger and fullness using the Hunger Scale. Patients will also discuss reasons for non-hunger eating and learn strategies to use for controlling emotional eating.  Targeting Your Nutrition Priorities Clinical staff led group instruction and group discussion with PowerPoint presentation and patient guidebook. To enhance the learning environment the use of posters, models and  videos may be added. Patients will learn how to determine their genetic susceptibility to disease by reviewing their family history. Patients will gain insight into the importance of diet as part of an overall healthy lifestyle in mitigating the impact of genetics and other environmental insults. The purpose of this lesson is to provide patients with the opportunity to assess their personal nutrition priorities by looking at their family history, their own health history and current risk factors. Patients will also be able to discuss ways of prioritizing and modifying the Pritikin Eating Plan for their highest risk areas  Menu  Clinical staff led group instruction and group discussion with PowerPoint presentation and patient guidebook. To enhance the learning environment the use of posters, models and videos may be added. Using menus brought in from [employer], or printed from [employer], patients will apply the Pritikin dining out guidelines that were presented in the [employer] video. Patients will also be able to practice these guidelines in a variety of provided scenarios. The purpose of this lesson is to provide patients with the opportunity to practice hands-on learning of the Pritikin Dining Out guidelines with actual menus and practice scenarios.  Label Reading Clinical staff led group instruction and group discussion with PowerPoint presentation and patient guidebook. To enhance the learning environment the use of posters, models and videos may be added. Patients will review and discuss the Pritikin label reading guidelines presented in Pritikin's Label Reading Educational series video. Using  fool labels brought in from local grocery stores and markets, patients will apply the label reading guidelines and determine if the packaged food meet the Pritikin guidelines. The purpose of this lesson is to provide patients with the opportunity to review, discuss, and practice  hands-on learning of the Pritikin Label Reading guidelines with actual packaged food labels. Cooking School  Pritikin's [employer] are designed to teach patients ways to prepare quick, simple, and affordable recipes at home. The importance of nutrition's role in chronic disease risk reduction is reflected in its emphasis in the overall Pritikin program. By learning how to prepare essential core Pritikin Eating Plan recipes, patients will increase control over what they eat; be able to customize the flavor of foods without the use of added salt, sugar, or fat; and improve the quality of the food they consume. By learning a set of core recipes which are easily assembled, quickly prepared, and affordable, patients are more likely to prepare more healthy foods at home. These workshops focus on convenient breakfasts, simple entres, side dishes, and desserts which can be prepared with minimal effort and are consistent with nutrition recommendations for cardiovascular risk reduction. Cooking [employer] are taught by a Armed forces logistics/support/administrative officer (RD) who has been trained by the [employer]. The chef or RD has a clear understanding of the importance of minimizing - if not completely eliminating - added fat, sugar, and sodium in recipes. Throughout the series of Cooking School Workshop sessions, patients will learn about healthy ingredients and efficient methods of cooking to build confidence in their capability to prepare    Cooking School weekly topics:  Adding Flavor- Sodium-Free  Fast and Healthy Breakfasts  Powerhouse Plant-Based Proteins  Satisfying Salads and Dressings  Simple Sides and Sauces  International Cuisine-Spotlight on the [employer] Zones  Delicious Desserts  Savory Soups  [employer] - Meals in a Astronomer Appetizers and Snacks  Comforting Weekend Breakfasts  One-Pot Wonders   Fast Evening Meals  Landscape architect Your Pritikin  Plate  WORKSHOPS   Healthy Mindset (Psychosocial):  Focused Goals, Sustainable Changes Clinical staff led group instruction and group discussion with PowerPoint presentation and patient guidebook. To enhance the learning environment the use of posters, models and videos may be added. Patients will be able to apply effective goal setting strategies to establish at least one personal goal, and then take consistent, meaningful action toward that goal. They will learn to identify common barriers to achieving personal goals and develop strategies to overcome them. Patients will also gain an understanding of how our mind-set can impact our ability to achieve goals and the importance of cultivating a positive and growth-oriented mind-set. The purpose of this lesson is to provide patients with a deeper understanding of how to set and achieve personal goals, as well as the tools and strategies needed to overcome common obstacles which may arise along the way.  From Head to Heart: The Power of a Healthy Outlook  Clinical staff led group instruction and group discussion with PowerPoint presentation and patient guidebook. To enhance the learning environment the use of posters, models and videos may be added. Patients will be able to recognize and describe the impact of emotions and mood on physical health. They will discover the importance of self-care and explore self-care practices which may work for them. Patients will also learn how to utilize the 4 C's to cultivate a healthier outlook and better manage stress and challenges. The purpose of  this lesson is to demonstrate to patients how a healthy outlook is an essential part of maintaining good health, especially as they continue their cardiac rehab journey.  Healthy Sleep for a Healthy Heart Clinical staff led group instruction and group discussion with PowerPoint presentation and patient guidebook. To enhance the learning environment the use of posters,  models and videos may be added. At the conclusion of this workshop, patients will be able to demonstrate knowledge of the importance of sleep to overall health, well-being, and quality of life. They will understand the symptoms of, and treatments for, common sleep disorders. Patients will also be able to identify daytime and nighttime behaviors which impact sleep, and they will be able to apply these tools to help manage sleep-related challenges. The purpose of this lesson is to provide patients with a general overview of sleep and outline the importance of quality sleep. Patients will learn about a few of the most common sleep disorders. Patients will also be introduced to the concept of "sleep hygiene," and discover ways to self-manage certain sleeping problems through simple daily behavior changes. Finally, the workshop will motivate patients by clarifying the links between quality sleep and their goals of heart-healthy living.   Recognizing and Reducing Stress Clinical staff led group instruction and group discussion with PowerPoint presentation and patient guidebook. To enhance the learning environment the use of posters, models and videos may be added. At the conclusion of this workshop, patients will be able to understand the types of stress reactions, differentiate between acute and chronic stress, and recognize the impact that chronic stress has on their health. They will also be able to apply different coping mechanisms, such as reframing negative self-talk. Patients will have the opportunity to practice a variety of stress management techniques, such as deep abdominal breathing, progressive muscle relaxation, and/or guided imagery.  The purpose of this lesson is to educate patients on the role of stress in their lives and to provide healthy techniques for coping with it.  Learning Barriers/Preferences:  Learning Barriers/Preferences - 07/30/23 1127       Learning Barriers/Preferences   Learning  Barriers Sight   wears glasses   Learning Preferences Audio;Computer/Internet;Group Instruction;Individual Instruction;Pictoral;Skilled Demonstration;Verbal Instruction;Written Material;Video             Education Topics:  Knowledge Questionnaire Score:  Knowledge Questionnaire Score - 07/30/23 1134       Knowledge Questionnaire Score   Pre Score 23/24             Core Components/Risk Factors/Patient Goals at Admission:  Personal Goals and Risk Factors at Admission - 07/30/23 1128       Core Components/Risk Factors/Patient Goals on Admission    Weight Management Yes    Intervention Weight Management: Develop a combined nutrition and exercise program designed to reach desired caloric intake, while maintaining appropriate intake of nutrient and fiber, sodium and fats, and appropriate energy expenditure required for the weight goal.;Weight Management: Provide education and appropriate resources to help participant work on and attain dietary goals.    Expected Outcomes Short Term: Continue to assess and modify interventions until short term weight is achieved;Long Term: Adherence to nutrition and physical activity/exercise program aimed toward attainment of established weight goal;Understanding recommendations for meals to include 15-35% energy as protein, 25-35% energy from fat, 35-60% energy from carbohydrates, less than 200mg  of dietary cholesterol, 20-35 gm of total fiber daily;Understanding of distribution of calorie intake throughout the day with the consumption of 4-5 meals/snacks    Hypertension  Yes    Intervention Provide education on lifestyle modifcations including regular physical activity/exercise, weight management, moderate sodium restriction and increased consumption of fresh fruit, vegetables, and low fat dairy, alcohol moderation, and smoking cessation.;Monitor prescription use compliance.    Expected Outcomes Short Term: Continued assessment and intervention until BP  is < 140/42mm HG in hypertensive participants. < 130/67mm HG in hypertensive participants with diabetes, heart failure or chronic kidney disease.;Long Term: Maintenance of blood pressure at goal levels.    Lipids Yes    Intervention Provide education and support for participant on nutrition & aerobic/resistive exercise along with prescribed medications to achieve LDL 70mg , HDL >40mg .    Expected Outcomes Short Term: Participant states understanding of desired cholesterol values and is compliant with medications prescribed. Participant is following exercise prescription and nutrition guidelines.;Long Term: Cholesterol controlled with medications as prescribed, with individualized exercise RX and with personalized nutrition plan. Value goals: LDL < 70mg , HDL > 40 mg.             Core Components/Risk Factors/Patient Goals Review:   Goals and Risk Factor Review     Row Name 08/07/23 0803 08/28/23 0823           Core Components/Risk Factors/Patient Goals Review   Personal Goals Review Weight Management/Obesity;Hypertension;Lipids Weight Management/Obesity;Hypertension;Lipids      Review Terry Kerr started cardiac rehab on 08/06/23. Terry Kerr did well with exercise. Vital signs were stable. Terry Kerr is doing  well with exercise at cardiac rehab. Vital signs have been  stable.      Expected Outcomes Terry Kerr will continue to participate in cardiac rehab for exercise, nutrition and lifesytle modifications Terry Kerr will continue to participate in cardiac rehab for exercise, nutrition and lifesytle modifications               Core Components/Risk Factors/Patient Goals at Discharge (Final Review):   Goals and Risk Factor Review - 08/28/23 0823       Core Components/Risk Factors/Patient Goals Review   Personal Goals Review Weight Management/Obesity;Hypertension;Lipids    Review Terry Kerr is doing  well with exercise at cardiac rehab. Vital signs have been  stable.    Expected Outcomes Terry Kerr will continue to participate in  cardiac rehab for exercise, nutrition and lifesytle modifications             ITP Comments:  ITP Comments     Row Name 07/30/23 1124 08/07/23 0759 08/28/23 [id number]       ITP Comments Dr. Armanda Magic medical director. Introduction to pritikin education/intensive cardiac rehab. Initial orientation packet reviewed with patient. 30 Day ITP Review. Terry Kerr started cardiac rehab on 08/07/23. Terry Kerr did well with exercise. 30 Day ITP Review. Terry Kerr has good attendance and particpation with exercise at cardiac rehab              Comments: See ITP Comments

## 2023-08-29 ENCOUNTER — Encounter (HOSPITAL_COMMUNITY)
Admission: RE | Admit: 2023-08-29 | Discharge: 2023-08-29 | Disposition: A | Discharge status: Home / Self Care | Payer: Medicare Other | Source: Ambulatory Visit | Attending: Cardiology | Admitting: Cardiology

## 2023-08-29 DIAGNOSIS — Z955 Presence of coronary angioplasty implant and graft: Secondary | ICD-10-CM | POA: Insufficient documentation

## 2023-08-29 DIAGNOSIS — I2102 ST elevation (STEMI) myocardial infarction involving left anterior descending coronary artery: Secondary | ICD-10-CM | POA: Diagnosis present

## 2023-08-30 ENCOUNTER — Other Ambulatory Visit: Payer: Self-pay | Admitting: Medical Genetics

## 2023-08-31 ENCOUNTER — Encounter (HOSPITAL_COMMUNITY)
Admission: RE | Admit: 2023-08-31 | Discharge: 2023-08-31 | Disposition: A | Discharge status: Home / Self Care | Payer: Medicare Other | Source: Ambulatory Visit | Attending: Cardiology | Admitting: Cardiology

## 2023-08-31 DIAGNOSIS — I2102 ST elevation (STEMI) myocardial infarction involving left anterior descending coronary artery: Secondary | ICD-10-CM | POA: Diagnosis not present

## 2023-08-31 DIAGNOSIS — Z955 Presence of coronary angioplasty implant and graft: Secondary | ICD-10-CM

## 2023-09-03 ENCOUNTER — Encounter (HOSPITAL_COMMUNITY)
Admission: RE | Admit: 2023-09-03 | Discharge: 2023-09-03 | Disposition: A | Discharge status: Home / Self Care | Payer: Medicare Other | Source: Ambulatory Visit | Attending: Cardiology | Admitting: Cardiology

## 2023-09-03 DIAGNOSIS — I2102 ST elevation (STEMI) myocardial infarction involving left anterior descending coronary artery: Secondary | ICD-10-CM

## 2023-09-03 DIAGNOSIS — Z955 Presence of coronary angioplasty implant and graft: Secondary | ICD-10-CM

## 2023-09-05 ENCOUNTER — Encounter (HOSPITAL_COMMUNITY)
Admission: RE | Admit: 2023-09-05 | Discharge: 2023-09-05 | Disposition: A | Discharge status: Home / Self Care | Payer: Medicare Other | Source: Ambulatory Visit | Attending: Cardiology | Admitting: Cardiology

## 2023-09-05 DIAGNOSIS — I2102 ST elevation (STEMI) myocardial infarction involving left anterior descending coronary artery: Secondary | ICD-10-CM

## 2023-09-05 DIAGNOSIS — Z955 Presence of coronary angioplasty implant and graft: Secondary | ICD-10-CM

## 2023-09-05 NOTE — Progress Notes (Unsigned)
 Cardiology Office Note    Date:  09/10/2023  ID:  Terry, Kerr [birth year], MRN [id number] PCP:  Jeannine Milroy., MD  Cardiologist:  Eilleen Grates, MD  Electrophysiologist:  None   Chief Complaint: Follow up for CAD   History of Present Illness: Terry Kerr is a 68 y.o. male with visit-pertinent history of CAD s/p STEMI on 07/16/2023, hyperlipidemia, Crohn's disease s/p colon resection in 2016.  First seen by Dr. Lavonne Prairie in 2020 for evaluation of shoulder pain/dyspnea, referred to coronary calcium score of 23, 39th percentile.  On 07/16/2023 he presented to the emergency room for chest pain.  He had been in his usual state of health until the day of admission.  He was playing pickle ball and developed centralized chest pain with radiation into the left arm with associated diaphoresis.  He was able to finish the game but continued to have pain.  He was brought to the emergency room by his neighbor.  EKG showed sinus rhythm, 71 bpm with inferolateral ST elevation.  Code STEMI was called.  Cardiac catheterization indicated two-vessel CAD, 90% mid LAD treated PCI/DES x 1, 80% large PL branch of RCA treated with PCI/DES x 1.  Mildly elevated LVEDP of 19 mmHg.  Was recommended that patient have DAPT with aspirin and Effient for at least 1 year.  Echocardiogram on 07/17/2023 indicated LVEF of 50 to 55%, LV with low normal function, LV demonstrates some regional wall motion abnormalities, mild concentric LVH, diastolic parameters were normal, apical hypokinesis without apical thrombus.  Right ventricular systolic function was normal.  There were no significant valvular abnormalities.  Patient was discharged on 07/17/2023 in stable condition on aspirin, Effient, atorvastatin, carvedilol, losartan and spironolactone.  Patient was last in clinic on 07/24/2023, patient remained stable from a cardiac standpoint.  Today presents for follow-up.  He reports that he is doing very well overall.  He denies chest pain, shortness of breath, lower extremity edema, orthopnea or pnd.  He continues working with cardiac rehab and is tolerating very well.  He is looking forward to getting back to playing pickle ball.  He reports that he is tolerating all medications very well, denies any presyncope or syncope.  Labwork independently reviewed: 07/24/2023: Sodium 143, calcium 4.7, creatinine 1.05 ROS: .   Today he denies chest pain, shortness of breath, lower extremity edema, fatigue, palpitations, melena, hematuria, hemoptysis, diaphoresis, weakness, presyncope, syncope, orthopnea, and PND.  All other systems are reviewed and otherwise negative. Studies Reviewed: Terry Kerr   EKG:  EKG is not ordered today.  CV Studies:  Cardiac Studies & Procedures   ______________________________________________________________________________________________ CARDIAC CATHETERIZATION  CARDIAC CATHETERIZATION 07/16/2023  Conclusion 2 vessel obstructive CAD. 90% mid LAD, 80% large PL branch of the RCA Mild LV dysfunction with apical dyskinesis Mildly elevated LVEDP 19 mm Hg Successful PCI of the mid LAD with DES Successful PCI of the PL branch of the RCA with DES  Plan: check Echo. DAPT for one year. Possible DC tomorrow if no complications.  Findings Coronary Findings Diagnostic  Dominance: Right  Left Anterior Descending Mid LAD lesion is 90% stenosed. The lesion is ulcerative.  First Diagonal Branch Vessel is small in size.  Lateral First Diagonal Branch Vessel is small in size. Lat 1st Diag lesion is 90% stenosed.  Right Coronary Artery  Right Posterior Descending Artery Vessel is small in size.  Right Posterior Atrioventricular Artery RPAV lesion is 80% stenosed.  Third Right Posterolateral Branch Vessel  is moderate in size.  Intervention  Mid LAD lesion Stent CATH LAUNCHER 6FR EBU3.5 guide catheter was inserted. Lesion crossed with guidewire using a WIRE ASAHI PROWATER 180CM. Pre-stent  angioplasty was performed using a BALLN EMERGE MR 2.5X12. A drug-eluting stent was successfully placed using a SYNERGY XD 3.0X12. Stent strut is well apposed. Post-stent angioplasty was performed using a BALL SAPPHIRE NC24 3.5X8. Maximum pressure:  14 atm. Post-Intervention Lesion Assessment The intervention was successful. Pre-interventional TIMI flow is 3. Post-intervention TIMI flow is 3. No complications occurred at this lesion. There is a 0% residual stenosis post intervention.  RPAV lesion Stent CATH VISTA GUIDE 6FR JR4 guide catheter was inserted. Lesion crossed with guidewire using a WIRE ASAHI PROWATER 180CM. Pre-stent angioplasty was performed using a BALLN EMERGE MR 2.0X12. A drug-eluting stent was successfully placed using a SYNERGY XD 2.50X16. Stent strut is well apposed. Post-stent angioplasty was performed using a BALL SAPPHIRE NC24 2.75X10. Maximum pressure:  16 atm. Post-Intervention Lesion Assessment The intervention was successful. Pre-interventional TIMI flow is 3. Post-intervention TIMI flow is 3. No complications occurred at this lesion. There is a 0% residual stenosis post intervention.   STRESS TESTS  EXERCISE TOLERANCE TEST (ETT) 04/11/2019  Narrative  Blood pressure demonstrated a normal response to exercise.  There was no ST segment deviation noted during stress.  Normal ETT Normal hemodynamic response No significant arrhythmia   ECHOCARDIOGRAM  ECHOCARDIOGRAM COMPLETE 07/17/2023  Narrative ECHOCARDIOGRAM REPORT    Patient Name:   Terry Kerr Date of Exam: 07/17/2023 Medical Rec #:  [id number]       Height:       68.0 in Accession #:    [id number]      Weight:       190.0 lb Date of Birth:  1955-07-21       BSA:          2.000 m Patient Age:    37 years        BP:           127/81 mmHg Patient Gender: M               HR:           60 bpm. Exam Location:  Inpatient  Procedure: 2D Echo, Cardiac Doppler, Color Doppler and  Intracardiac Opacification Agent (Both Spectral and Color Flow Doppler were utilized during procedure).  Indications:    122-I22.9 Subsequent ST elevation (STEM) and non-ST elevation (NSTEMI) myocardial infarction  History:        Patient has no prior history of Echocardiogram examinations. Acute MI and CAD; Risk Factors:Dyslipidemia.  Sonographer:    Raynelle Callow RDCS Referring Phys: [phone] LINDSAY B ROBERTS  IMPRESSIONS   1. Left ventricular ejection fraction, by estimation, is 50 to 55%. The left ventricle has low normal function. The left ventricle demonstrates regional wall motion abnormalities (see scoring diagram/findings for description). There is mild concentric left ventricular hypertrophy. Left ventricular diastolic parameters were normal. Apical hypokinesis without apical thrombus. 2. Right ventricular systolic function is normal. The right ventricular size is normal. 3. The mitral valve is normal in structure. No evidence of mitral valve regurgitation. No evidence of mitral stenosis. 4. The aortic valve is tricuspid. Aortic valve regurgitation is not visualized. No aortic stenosis is present. 5. The inferior vena cava is normal in size with <50% respiratory variability, suggesting right atrial pressure of 8 mmHg. 6. Echodensity in the liver consistent with hepatic cyst.  Comparison(s): No prior Echocardiogram.  Conclusion(s)/Recommendation(s):  Consider either correlation to prior abdominal imaging or dedicated liver ultrasound.  FINDINGS Left Ventricle: Left ventricular ejection fraction, by estimation, is 50 to 55%. The left ventricle has low normal function. The left ventricle demonstrates regional wall motion abnormalities. Definity contrast agent was given IV to delineate the left ventricular endocardial borders. Strain imaging was not performed. The left ventricular internal cavity size was normal in size. There is mild concentric left ventricular hypertrophy. Left  ventricular diastolic parameters were normal.   LV Wall Scoring: The apical lateral segment, apical septal segment, apical anterior segment, and apical inferior segment are hypokinetic. The anterior wall, antero-lateral wall, anterior septum, inferior wall, posterior wall, mid inferoseptal segment, and basal inferoseptal segment are normal. Apical hypokinesis without apical thrombus.  Right Ventricle: The right ventricular size is normal. No increase in right ventricular wall thickness. Right ventricular systolic function is normal.  Left Atrium: Left atrial size was normal in size.  Right Atrium: Right atrial size was normal in size.  Pericardium: There is no evidence of pericardial effusion.  Mitral Valve: The mitral valve is normal in structure. No evidence of mitral valve regurgitation. No evidence of mitral valve stenosis.  Tricuspid Valve: The tricuspid valve is normal in structure. Tricuspid valve regurgitation is not demonstrated. No evidence of tricuspid stenosis.  Aortic Valve: The aortic valve is tricuspid. Aortic valve regurgitation is not visualized. No aortic stenosis is present.  Pulmonic Valve: The pulmonic valve was normal in structure. Pulmonic valve regurgitation is trivial. No evidence of pulmonic stenosis.  Aorta: The aortic root and ascending aorta are structurally normal, with no evidence of dilitation.  Venous: The inferior vena cava is normal in size with less than 50% respiratory variability, suggesting right atrial pressure of 8 mmHg.  IAS/Shunts: The atrial septum is grossly normal.  Additional Comments: 3D imaging was not performed.   LEFT VENTRICLE PLAX 2D LVIDd:         3.80 cm     Diastology LVIDs:         2.30 cm     LV e' medial:    8.27 cm/s LV PW:         1.30 cm     LV E/e' medial:  9.4 LV IVS:        1.10 cm     LV e' lateral:   8.81 cm/s LVOT diam:     2.20 cm     LV E/e' lateral: 8.8 LV SV:         77 LV SV Index:   38 LVOT Area:      3.80 cm  LV Volumes (MOD) LV vol d, MOD A2C: 78.6 ml LV vol d, MOD A4C: 83.5 ml LV vol s, MOD A2C: 39.6 ml LV vol s, MOD A4C: 38.7 ml LV SV MOD A2C:     39.0 ml LV SV MOD A4C:     83.5 ml LV SV MOD BP:      43.3 ml  RIGHT VENTRICLE             IVC RV S prime:     15.70 cm/s  IVC diam: 2.10 cm TAPSE (M-mode): 1.7 cm  LEFT ATRIUM             Index        RIGHT ATRIUM           Index LA Vol (A2C):   29.1 ml 14.55 ml/m  RA Area:     11.30 cm LA Vol (A4C):  29.3 ml 14.65 ml/m  RA Volume:   20.80 ml  10.40 ml/m LA Biplane Vol: 29.8 ml 14.90 ml/m AORTIC VALVE LVOT Vmax:   92.50 cm/s LVOT Vmean:  61.900 cm/s LVOT VTI:    0.202 m  AORTA Ao Root diam: 3.40 cm Ao Asc diam:  3.40 cm  MITRAL VALVE MV Area (PHT): 3.21 cm    SHUNTS MV Decel Time: 236 msec    Systemic VTI:  0.20 m MV E velocity: 77.50 cm/s  Systemic Diam: 2.20 cm MV A velocity: 65.20 cm/s MV E/A ratio:  1.19  Gloriann Larger MD Electronically signed by Gloriann Larger MD Signature Date/Time: 07/17/2023/5:15:25 PM    Final      CT SCANS  CT CARDIAC SCORING (SELF PAY ONLY) 03/14/2019  Addendum 03/14/2019  2:46 PM ADDENDUM REPORT: 03/14/2019 14:43  CLINICAL DATA:  Risk stratification  EXAM: Coronary Calcium Score  TECHNIQUE: The patient was scanned on a [employer] scanner. Axial non-contrast 3 mm slices were carried out through the heart. The data set was analyzed on a dedicated work station and scored using the Agatson method.  FINDINGS: Non-cardiac: See separate report from [facility].  Ascending Aorta: Normal Caliber.  Scattered calcifications.  Pericardium: Normal  Coronary arteries: Normal coronary origins. Coronary calcifications in the RCA and LAD.  IMPRESSION: Coronary calcium score of 23. This was 39th percentile for age and sex matched control.  Gaylyn Keas   Electronically Signed By: Gaylyn Keas On: 03/14/2019  14:43  Narrative EXAM: OVER-READ INTERPRETATION  CT CHEST  The following report is an over-read performed by radiologist Dr. Janeece Mechanic of [facility] on 03/14/2019. This over-read does not include interpretation of cardiac or coronary anatomy or pathology. The coronary calcium score interpretation by the cardiologist is attached.  COMPARISON:  None.  FINDINGS: Vascular: Scattered aortic calcifications. No evidence of aortic aneurysm. Heart is normal size.  Mediastinum/Nodes: No adenopathy in the lower mediastinum or hila.  Lungs/Pleura: Visualized lungs clear.  No effusions.  Upper Abdomen: Scattered low-density lesions in the visualized liver are stable since prior abdominal CT and most compatible with cysts. No acute findings.  Musculoskeletal: Chest wall soft tissues are unremarkable. No acute bony abnormality.  IMPRESSION: No acute extra cardiac abnormality.  Scattered aortic atherosclerosis.  Stable hepatic cysts.  Electronically Signed: By: Janeece Mechanic M.D. On: 03/14/2019 10:32     ______________________________________________________________________________________________       Current Reported Medications:.    Current Meds  Medication Sig   aspirin EC 81 MG tablet Take 1 tablet (81 mg total) by mouth daily. Swallow whole.   atorvastatin (LIPITOR) 80 MG tablet Take 1 tablet (80 mg total) by mouth daily.   carvedilol (COREG) 6.25 MG tablet Take 1 tablet (6.25 mg total) by mouth 2 (two) times daily with a meal.   Cholecalciferol (VITAMIN D) 2000 UNITS tablet Take 2,000 Units by mouth daily.   HUMIRA, 2 PEN, 40 MG/0.4ML pen INJECT 1 PEN UNDER THE SKIN EVERY 14 DAYS.   losartan (COZAAR) 25 MG tablet Take 1 tablet (25 mg total) by mouth daily.   prasugrel (EFFIENT) 10 MG TABS tablet TAKE 1 TABLET(10 MG) BY MOUTH DAILY   spironolactone (ALDACTONE) 25 MG tablet Take 0.5 tablets (12.5 mg total) by mouth daily.    Physical Exam:    VS:  BP  (!) 114/58 (BP Location: Left Arm, Patient Position: Sitting, Cuff Size: Normal)   Pulse (!) 52   Ht 5\' 8"  (1.727 m)   Wt  176 lb 9.6 oz (80.1 kg)   SpO2 99%   BMI 26.85 kg/m    Wt Readings from Last 3 Encounters:  09/10/23 176 lb 9.6 oz (80.1 kg)  07/30/23 178 lb 5.6 oz (80.9 kg)  07/24/23 180 lb (81.6 kg)    GEN: Well nourished, well developed in no acute distress NECK: No JVD; No carotid bruits CARDIAC: RRR, no murmurs, rubs, gallops RESPIRATORY:  Clear to auscultation without rales, wheezing or rhonchi  ABDOMEN: Soft, non-tender, non-distended EXTREMITIES:  No edema; No acute deformity     Asessement and Plan:.    CAD: Cardiac catheterization on 07/16/2023 with two-vessel CAD, 90% mid LAD treated with PCI/DES x 1, 80% large PL branch of RCA treated with PCI/DES x 1. Recommended for DAPT with aspirin and Effient for at least 1 year. Today he reports that he is doing very well overall. Stable with no anginal symptoms. No indication for ischemic evaluation.  Heart healthy diet and regular cardiovascular exercise encouraged.  Reviewed ED precautions. Continue aspirin, Effient, atorvastatin 80 mg daily, carvedilol 6.25 mg twice daily, losartan 25 mg daily and spironolactone 12.5 mg daily.  Hyperlipidemia: Last lipid profile indicated total cholesterol 289, HDL 49, triglycerides 172 and LDL 207.  Lipoprotein a 64.6.  Patient was started on atorvastatin 80 mg daily during admission. Patient to have fasting lab work completed with Dr. Bernetta Brilliant this week, he notes he will have them fax results to our office. Patient to follow up with pharmD lipid clinic next month.   NSVT: Noted post reperfusion, patient was asymptomatic. Today patient denies any palpitations or feeling of increased heart rates.  Continue carvedilol 6.25 mg twice daily.  Hypertension: Blood pressure today 114/58.  Continue current antihypertensive regimen.   Disposition: F/u with Dr. Lavonne Prairie or Arpita Fentress, NP in 5-6 months or  sooner if needed.   Signed, Johnell Landowski D Termaine Roupp, NP

## 2023-09-07 ENCOUNTER — Encounter (HOSPITAL_COMMUNITY)
Admission: RE | Admit: 2023-09-07 | Discharge: 2023-09-07 | Disposition: A | Discharge status: Home / Self Care | Payer: Medicare Other | Source: Ambulatory Visit | Attending: Cardiology | Admitting: Cardiology

## 2023-09-07 DIAGNOSIS — I2102 ST elevation (STEMI) myocardial infarction involving left anterior descending coronary artery: Secondary | ICD-10-CM | POA: Diagnosis not present

## 2023-09-07 DIAGNOSIS — Z955 Presence of coronary angioplasty implant and graft: Secondary | ICD-10-CM

## 2023-09-10 ENCOUNTER — Encounter: Payer: Self-pay | Admitting: Cardiology

## 2023-09-10 ENCOUNTER — Ambulatory Visit: Payer: Medicare Other | Attending: Cardiology | Admitting: Cardiology

## 2023-09-10 ENCOUNTER — Encounter (HOSPITAL_COMMUNITY): Admission: RE | Admit: 2023-09-10 | Payer: Medicare Other | Source: Ambulatory Visit

## 2023-09-10 VITALS — BP 114/58 | HR 52 | Ht 68.0 in | Wt 176.6 lb

## 2023-09-10 DIAGNOSIS — I2510 Atherosclerotic heart disease of native coronary artery without angina pectoris: Secondary | ICD-10-CM | POA: Diagnosis not present

## 2023-09-10 DIAGNOSIS — E782 Mixed hyperlipidemia: Secondary | ICD-10-CM | POA: Diagnosis present

## 2023-09-10 DIAGNOSIS — I4729 Other ventricular tachycardia: Secondary | ICD-10-CM | POA: Diagnosis not present

## 2023-09-10 DIAGNOSIS — I10 Essential (primary) hypertension: Secondary | ICD-10-CM | POA: Diagnosis not present

## 2023-09-10 NOTE — Patient Instructions (Signed)
 Medication Instructions:  No changes *If you need a refill on your cardiac medications before your next appointment, please call your pharmacy*  Lab Work: No labs  Testing/Procedures: No testing  Follow-Up: At [facility], you and your health needs are our priority.  As part of our continuing mission to provide you with exceptional heart care, our providers are all part of one team.  This team includes your primary Cardiologist (physician) and Advanced Practice Providers or APPs (Physician Assistants and Nurse Practitioners) who all work together to provide you with the care you need, when you need it.  Your next appointment:   5-6 month(s)  Provider:   Eilleen Grates, MD    We recommend signing up for the patient portal called "MyChart".  Sign up information is provided on this After Visit Summary.  MyChart is used to connect with patients for Virtual Visits (Telemedicine).  Patients are able to view lab/test results, encounter notes, upcoming appointments, etc.  Non-urgent messages can be sent to your provider as well.   To learn more about what you can do with MyChart, go to [email or web address].   Other Instructions   1st Floor: - Lobby - Registration  - Pharmacy  - Lab - Cafe  2nd Floor: - PV Lab - Diagnostic Testing (echo, CT, nuclear med)  3rd Floor: - Vacant  4th Floor: - TCTS (cardiothoracic surgery) - AFib Clinic - Structural Heart Clinic - Vascular Surgery  - Vascular Ultrasound  5th Floor: - [facility] (general and EP) - Clinical Pharmacy for coumadin, hypertension, lipid, weight-loss medications, and med management appointments    Valet parking services will be available as well.

## 2023-09-11 ENCOUNTER — Other Ambulatory Visit

## 2023-09-11 DIAGNOSIS — Z006 Encounter for examination for normal comparison and control in clinical research program: Secondary | ICD-10-CM

## 2023-09-12 ENCOUNTER — Encounter (HOSPITAL_COMMUNITY): Admission: RE | Admit: 2023-09-12 | Payer: Medicare Other | Source: Ambulatory Visit

## 2023-09-13 ENCOUNTER — Telehealth (HOSPITAL_COMMUNITY): Payer: Self-pay | Admitting: *Deleted

## 2023-09-13 NOTE — Telephone Encounter (Signed)
 Left message called to inquire about absence on 09/12/23.Monte Antonio RN BSN

## 2023-09-14 ENCOUNTER — Encounter (HOSPITAL_COMMUNITY)
Admission: RE | Admit: 2023-09-14 | Discharge: 2023-09-14 | Disposition: A | Discharge status: Home / Self Care | Payer: Medicare Other | Source: Ambulatory Visit | Attending: Cardiology | Admitting: Cardiology

## 2023-09-14 DIAGNOSIS — I2102 ST elevation (STEMI) myocardial infarction involving left anterior descending coronary artery: Secondary | ICD-10-CM | POA: Diagnosis not present

## 2023-09-17 ENCOUNTER — Ambulatory Visit: Payer: Medicare Other | Admitting: Cardiology

## 2023-09-17 ENCOUNTER — Encounter (HOSPITAL_COMMUNITY)
Admission: RE | Admit: 2023-09-17 | Discharge: 2023-09-17 | Disposition: A | Discharge status: Home / Self Care | Payer: Medicare Other | Source: Ambulatory Visit | Attending: Cardiology | Admitting: Cardiology

## 2023-09-17 DIAGNOSIS — Z955 Presence of coronary angioplasty implant and graft: Secondary | ICD-10-CM

## 2023-09-17 DIAGNOSIS — I2102 ST elevation (STEMI) myocardial infarction involving left anterior descending coronary artery: Secondary | ICD-10-CM | POA: Diagnosis not present

## 2023-09-18 NOTE — Progress Notes (Signed)
 Cardiac Individual Treatment Plan  Patient Details  Name: Terry Kerr MRN: [id number] Date of Birth: April 26, 1956 Referring Provider:   Flowsheet Row INTENSIVE CARDIAC REHAB ORIENT from 07/30/2023 in [facility]  Referring Provider Eilleen Grates, MD       Initial Encounter Date:  Flowsheet Row INTENSIVE CARDIAC REHAB ORIENT from 07/30/2023 in [facility] for Heart, Vascular, & Lung Health  Date 07/30/23       Visit Diagnosis: 07/16/23 STEMI  07/16/23 DES LAD  Patient's Home Medications on Admission:  Current Outpatient Medications:    aspirin  EC 81 MG tablet, Take 1 tablet (81 mg total) by mouth daily. Swallow whole., Disp: 90 tablet, Rfl: 2   atorvastatin  (LIPITOR) 80 MG tablet, Take 1 tablet (80 mg total) by mouth daily., Disp: 90 tablet, Rfl: 1   carvedilol  (COREG ) 6.25 MG tablet, Take 1 tablet (6.25 mg total) by mouth 2 (two) times daily with a meal., Disp: 60 tablet, Rfl: 2   Cholecalciferol (VITAMIN D ) 2000 UNITS tablet, Take 2,000 Units by mouth daily., Disp: , Rfl:    fexofenadine (ALLEGRA) 30 MG tablet, Take 30 mg by mouth daily as needed (for allergies).  (Patient not taking: Reported on 09/10/2023), Disp: , Rfl:    HUMIRA , 2 PEN, 40 MG/0.4ML pen, INJECT 1 PEN UNDER THE SKIN EVERY 14 DAYS., Disp: 2 each, Rfl: 3   hyoscyamine  (LEVSIN SL) 0.125 MG SL tablet, Take 1-2 tablet every 4-6 hours as needed for abdominal pain/spasm (Patient not taking: Reported on 09/10/2023), Disp: 120 tablet, Rfl: 1   losartan  (COZAAR ) 25 MG tablet, Take 1 tablet (25 mg total) by mouth daily., Disp: 90 tablet, Rfl: 1   nitroGLYCERIN  (NITROSTAT ) 0.4 MG SL tablet, Place 1 tablet (0.4 mg total) under the tongue every 5 (five) minutes as needed for chest pain (Do not give more than 3 SL tablets in 15 minutes.). (Patient not taking: Reported on 09/10/2023), Disp: 25 tablet, Rfl: 2   prasugrel  (EFFIENT ) 10 MG TABS tablet, TAKE  1 TABLET(10 MG) BY MOUTH DAILY, Disp: 90 tablet, Rfl: 3   spironolactone  (ALDACTONE ) 25 MG tablet, Take 0.5 tablets (12.5 mg total) by mouth daily., Disp: 30 tablet, Rfl: 1  Past Medical History: Past Medical History:  Diagnosis Date   Allergic rhinitis    pollen, animal hair, dust mites   Allergy    Crohn disease (HCC)    Diverticulosis    Hemorrhoids, internal, thrombosed s/p resection/pexy 12/06/2012 12/04/2012   HLD (hyperlipidemia)    Obesity    Regional enteritis (Crohn's disease) (HCC) 08/01/2007   Qualifier: Diagnosis of  By: Roxine Cordia MD, Candie Chamber    Small bowel obstruction [facility])     Tobacco Use: Social History   Tobacco Use  Smoking Status Never  Smokeless Tobacco Never    Labs: Review Flowsheet  More data exists      Latest Ref Rng & Units 12/19/2007 12/04/2008 02/23/2010 04/26/2010 07/16/2023  Labs for ITP Cardiac and Pulmonary Rehab  Cholestrol 0 - 200 mg/dL 409  811  - 914  782   LDL (calc) 0 - 99 mg/dL 956  213  - - 086   Direct LDL mg/dL - - - 578.4  -  HDL-C >69 mg/dL 62.9  52.84  - 13.24  49   Trlycerides <150 mg/dL 401  027.2  - 536.6  440   Hemoglobin A1c 4.8 - 5.6 % - - - - 5.0  TCO2 0 - 100 mmol/L - - 22  - -    Capillary Blood Glucose: No results found for: "GLUCAP"   Exercise Target Goals: Exercise Program Goal: Individual exercise prescription set using results from initial 6 min walk test and THRR while considering  patient's activity barriers and safety.   Exercise Prescription Goal: Initial exercise prescription builds to 30-45 minutes a day of aerobic activity, 2-3 days per week.  Home exercise guidelines will be given to patient during program as part of exercise prescription that the participant will acknowledge.  Activity Barriers & Risk Stratification:  Activity Barriers & Cardiac Risk Stratification - 07/30/23 1125       Activity Barriers & Cardiac Risk Stratification   Activity Barriers Neck/Spine Problems;Joint Problems     Cardiac Risk Stratification High   <5 METs on            6 Minute Walk:  6 Minute Walk     Row Name 07/30/23 1224         6 Minute Walk   Phase Initial     Distance 1860 feet     Walk Time 6 minutes     # of Rest Breaks 0     MPH 3.52     METS 4.03     RPE 9     Perceived Dyspnea  0     VO2 Peak 14.1     Symptoms No     Resting HR 57 bpm     Resting BP 112/70     Resting Oxygen Saturation  99 %     Exercise Oxygen Saturation  during 6 min walk 99 %     Max Ex. HR 97 bpm     Max Ex. BP 128/68     2 Minute Post BP 118/66              Oxygen Initial Assessment:   Oxygen Re-Evaluation:   Oxygen Discharge (Final Oxygen Re-Evaluation):   Initial Exercise Prescription:  Initial Exercise Prescription - 07/30/23 1200       Date of Initial Exercise RX and Referring Provider   Date 07/30/23    Referring Provider Eilleen Grates, MD    Expected Discharge Date 10/24/23      Treadmill   MPH 3.2    Grade 0    Minutes 15    METs 3.5      Recumbant Elliptical   Level 2    RPM 60    Watts 80    Minutes 15    METs 3.5      Prescription Details   Frequency (times per week) 3    Duration Progress to 30 minutes of continuous aerobic without signs/symptoms of physical distress      Intensity   THRR 40-80% of Max Heartrate 61-122    Ratings of Perceived Exertion 11-13    Perceived Dyspnea 0-4      Progression   Progression Continue progressive overload as per policy without signs/symptoms or physical distress.      Resistance Training   Training Prescription Yes    Weight 4    Reps 10-15             Perform Capillary Blood Glucose checks as needed.  Exercise Prescription Changes:   Exercise Prescription Changes     Row Name 08/06/23 1025 08/17/23 1023 08/27/23 1025 09/17/23 1029       Response to Exercise   Blood Pressure (Admit) 102/54 110/56 108/60 118/70  Blood Pressure (Exercise) 122/60 128/80 -- --    Blood Pressure (Exit)  104/62 100/58 96/58 112/62    Heart Rate (Admit) 60 bpm 58 bpm 53 bpm 54 bpm    Heart Rate (Exercise) 108 bpm 101 bpm 86 bpm 109 bpm    Heart Rate (Exit) 67 bpm 67 bpm 61 bpm 63 bpm    Rating of Perceived Exertion (Exercise) 11 11 12 12     Symptoms None None None None    Comments Off to a good start with exercise. Reviewed home exercise guidelines and goals with Terry Kerr. Increased weights today. Reviewed METs with Terry Kerr. Increased workload on recumbent elliptical today. --    Duration Continue with 30 min of aerobic exercise without signs/symptoms of physical distress. Continue with 30 min of aerobic exercise without signs/symptoms of physical distress. Continue with 30 min of aerobic exercise without signs/symptoms of physical distress. Continue with 30 min of aerobic exercise without signs/symptoms of physical distress.    Intensity THRR unchanged THRR unchanged THRR unchanged THRR unchanged      Progression   Progression Continue to progress workloads to maintain intensity without signs/symptoms of physical distress. Continue to progress workloads to maintain intensity without signs/symptoms of physical distress. Continue to progress workloads to maintain intensity without signs/symptoms of physical distress. Continue to progress workloads to maintain intensity without signs/symptoms of physical distress.    Average METs 3.5 5.3 4.7 6.3      Resistance Training   Training Prescription Yes Yes Yes Yes    Weight 4 lbs 8 lbs 8 lbs 8 lbs    Reps 10-15 10-15 10-15 10-15    Time 10 Minutes 10 Minutes 10 Minutes 10 Minutes      Interval Training   Interval Training No No No No      Treadmill   MPH 3 3.5 3.2 3.5    Grade 0 3 3.5 4    Minutes 15 15 15 15     METs 3.3 5.13 4.99 5.61      Recumbant Elliptical   Level 2 2 4 4     RPM 58 69 69 81    Watts 75 66 92 128    Minutes 15 15 15 15     METs 3.7 5.5 4.4 7.1      Home Exercise Plan   Plans to continue exercise at -- Home (comment)  Walking  Home (comment)  Walking Home (comment)  Walking    Frequency -- Add 4 additional days to program exercise sessions. Add 4 additional days to program exercise sessions. Add 4 additional days to program exercise sessions.    Initial Home Exercises Provided -- 08/17/23 08/17/23 08/17/23             Exercise Comments:   Exercise Comments     Row Name 08/06/23 1127 08/17/23 1026 08/27/23 1102       Exercise Comments Ken tolerated first session of exercise well without symptoms. Oriented to warm-up/cool-down and exercise routine. Reviewed home exercise guidelines and goals with Terry Kerr. Reviewed METs with Terry Kerr.              Exercise Goals and Review:   Exercise Goals     Row Name 07/30/23 1126             Exercise Goals   Increase Physical Activity Yes       Intervention Provide advice, education, support and counseling about physical activity/exercise needs.;Develop an individualized exercise prescription for aerobic and resistive training based on initial  evaluation findings, risk stratification, comorbidities and participant's personal goals.       Expected Outcomes Short Term: Attend rehab on a regular basis to increase amount of physical activity.;Long Term: Exercising regularly at least 3-5 days a week.;Long Term: Add in home exercise to make exercise part of routine and to increase amount of physical activity.       Increase Strength and Stamina Yes       Intervention Provide advice, education, support and counseling about physical activity/exercise needs.;Develop an individualized exercise prescription for aerobic and resistive training based on initial evaluation findings, risk stratification, comorbidities and participant's personal goals.       Expected Outcomes Short Term: Increase workloads from initial exercise prescription for resistance, speed, and METs.;Long Term: Improve cardiorespiratory fitness, muscular endurance and strength as measured by increased METs and  functional capacity ( );Short Term: Perform resistance training exercises routinely during rehab and add in resistance training at home       Able to understand and use rate of perceived exertion (RPE) scale Yes       Intervention Provide education and explanation on how to use RPE scale       Expected Outcomes Long Term:  Able to use RPE to guide intensity level when exercising independently;Short Term: Able to use RPE daily in rehab to express subjective intensity level       Knowledge and understanding of Target Heart Rate Range (THRR) Yes       Intervention Provide education and explanation of THRR including how the numbers were predicted and where they are located for reference       Expected Outcomes Short Term: Able to state/look up THRR;Short Term: Able to use daily as guideline for intensity in rehab;Long Term: Able to use THRR to govern intensity when exercising independently       Understanding of Exercise Prescription Yes       Intervention Provide education, explanation, and written materials on patient's individual exercise prescription       Expected Outcomes Short Term: Able to explain program exercise prescription;Long Term: Able to explain home exercise prescription to exercise independently                Exercise Goals Re-Evaluation :  Exercise Goals Re-Evaluation     Row Name 08/06/23 1127 08/17/23 1026 09/17/23 1130         Exercise Goal Re-Evaluation   Exercise Goals Review Increase Physical Activity;Increase Strength and Stamina;Able to understand and use rate of perceived exertion (RPE) scale Increase Physical Activity;Increase Strength and Stamina;Able to understand and use rate of perceived exertion (RPE) scale;Knowledge and understanding of Target Heart Rate Range (THRR);Able to check pulse independently;Understanding of Exercise Prescription Increase Physical Activity;Increase Strength and Stamina;Able to understand and use rate of perceived exertion (RPE)  scale;Knowledge and understanding of Target Heart Rate Range (THRR);Able to check pulse independently;Understanding of Exercise Prescription     Comments Terry Kerr was able to understand and use RPE scale appropriately. Reviewed exercise prescription with Terry Kerr. He is walking on the days he doesn't attend cardiac rehab and using 12 lb hand weights. Increased hand weights at cardiac rehab to 8 lbs and encouraged him to do weights on non-consecutive days. He is able to count his pulse. Terry Kerr continues to make great progress with exericse. Increasing workloads appropriately.     Expected Outcomes Progress workloads as tolerated to help increase strength and stamina. Terry Kerr will continue daily exercise routine to help improve cardiorespiratory fitness. Continue to progress workloads as  tolerated.              Discharge Exercise Prescription (Final Exercise Prescription Changes):  Exercise Prescription Changes - 09/17/23 1029       Response to Exercise   Blood Pressure (Admit) 118/70    Blood Pressure (Exit) 112/62    Heart Rate (Admit) 54 bpm    Heart Rate (Exercise) 109 bpm    Heart Rate (Exit) 63 bpm    Rating of Perceived Exertion (Exercise) 12    Symptoms None    Duration Continue with 30 min of aerobic exercise without signs/symptoms of physical distress.    Intensity THRR unchanged      Progression   Progression Continue to progress workloads to maintain intensity without signs/symptoms of physical distress.    Average METs 6.3      Resistance Training   Training Prescription Yes    Weight 8 lbs    Reps 10-15    Time 10 Minutes      Interval Training   Interval Training No      Treadmill   MPH 3.5    Grade 4    Minutes 15    METs 5.61      Recumbant Elliptical   Level 4    RPM 81    Watts 128    Minutes 15    METs 7.1      Home Exercise Plan   Plans to continue exercise at Home (comment)   Walking   Frequency Add 4 additional days to program exercise sessions.    Initial  Home Exercises Provided 08/17/23             Nutrition:  Target Goals: Understanding of nutrition guidelines, daily intake of sodium 1500mg , cholesterol 200mg , calories 30% from fat and 7% or less from saturated fats, daily to have 5 or more servings of fruits and vegetables.  Biometrics:  Pre Biometrics - 07/30/23 1123       Pre Biometrics   Waist Circumference 40 inches    Hip Circumference 39 inches    Waist to Hip Ratio 1.03 %    Triceps Skinfold 12 mm    % Body Fat 26.4 %    Grip Strength 40 kg    Flexibility 0 in   could not reach   Single Leg Stand 23 seconds              Nutrition Therapy Plan and Nutrition Goals:  Nutrition Therapy & Goals - 09/06/23 1306       Nutrition Therapy   Diet Heart Healthy Diet    Drug/Food Interactions Statins/Certain Fruits      Personal Nutrition Goals   Nutrition Goal Patient to identify strategies for reducing cardiovascular risk by attending the [facility] education and nutrition series weekly.   goal not met.   Personal Goal #2 Patient to improve diet quality by using the plate method as a guide for meal planning to include lean protein/plant protein, fruits, vegetables, whole grains, nonfat dairy as part of a well-balanced diet.   goal in progress.   Comments Terry Kerr has medical history of STEMI, hyperlipidemia, diverticulosis. No new labs at this time; Lipids are not at goal- LDL 206. Terry Kerr reports that he enjoys cooking.  He does not consistently attend the [facility] education/nutrition series. He is down 4.2# since starting with our program.  Patient will benefit from participation in intensive cardiac rehab for nutrition, exercise, and lifestyle modification.      Intervention Plan  Intervention Prescribe, educate and counsel regarding individualized specific dietary modifications aiming towards targeted core components such as weight, hypertension, lipid management, diabetes, heart failure and other comorbidities.;Nutrition  handout(s) given to patient.    Expected Outcomes Short Term Goal: Understand basic principles of dietary content, such as calories, fat, sodium, cholesterol and nutrients.;Long Term Goal: Adherence to prescribed nutrition plan.             Nutrition Assessments:  MEDIFICTS Score Key: >=70 Need to make dietary changes  40-70 Heart Healthy Diet <= 40 Therapeutic Level Cholesterol Diet    Picture Your Plate Scores: <81 Unhealthy dietary pattern with much room for improvement. 41-50 Dietary pattern unlikely to meet recommendations for good health and room for improvement. 51-60 More healthful dietary pattern, with some room for improvement.  >60 Healthy dietary pattern, although there may be some specific behaviors that could be improved.    Nutrition Goals Re-Evaluation:  Nutrition Goals Re-Evaluation     Row Name 08/06/23 1127 09/06/23 1306           Goals   Current Weight 178 lb 5.6 oz (80.9 kg) 174 lb 2.6 oz (79 kg)      Comment cholesterol 289, triglycerides 172, LDL 206, Lpa WNL, A1c WNL no new labs; most recent labs  cholesterol 289, triglycerides 172, LDL 206, Lpa WNL, A1c WNL      Expected Outcome Terry Kerr has medical history of STEMI, hyperlipidemia, diverticulosis. Lipids are not at goal; LDL 206. Terry Kerr reports that he enjoys cooking. Patient will benefit from participation in intensive cardiac rehab for nutrition, exercise, and lifestyle modification. Terry Kerr has medical history of STEMI, hyperlipidemia, diverticulosis. No new labs at this time; Lipids are not at goal- LDL 206. Terry Kerr reports that he enjoys cooking. He does not consistently attend the [facility] education/nutrition series. He is down 4.2# since starting with our program. Patient will benefit from participation in intensive cardiac rehab for nutrition, exercise, and lifestyle modification.               Nutrition Goals Re-Evaluation:  Nutrition Goals Re-Evaluation     Row Name 08/06/23 1127 09/06/23 1306            Goals   Current Weight 178 lb 5.6 oz (80.9 kg) 174 lb 2.6 oz (79 kg)      Comment cholesterol 289, triglycerides 172, LDL 206, Lpa WNL, A1c WNL no new labs; most recent labs  cholesterol 289, triglycerides 172, LDL 206, Lpa WNL, A1c WNL      Expected Outcome Terry Kerr has medical history of STEMI, hyperlipidemia, diverticulosis. Lipids are not at goal; LDL 206. Terry Kerr reports that he enjoys cooking. Patient will benefit from participation in intensive cardiac rehab for nutrition, exercise, and lifestyle modification. Terry Kerr has medical history of STEMI, hyperlipidemia, diverticulosis. No new labs at this time; Lipids are not at goal- LDL 206. Terry Kerr reports that he enjoys cooking. He does not consistently attend the [facility] education/nutrition series. He is down 4.2# since starting with our program. Patient will benefit from participation in intensive cardiac rehab for nutrition, exercise, and lifestyle modification.               Nutrition Goals Discharge (Final Nutrition Goals Re-Evaluation):  Nutrition Goals Re-Evaluation - 09/06/23 1306       Goals   Current Weight 174 lb 2.6 oz (79 kg)    Comment no new labs; most recent labs  cholesterol 289, triglycerides 172, LDL 206, Lpa WNL, A1c WNL    Expected  Outcome Terry Kerr has medical history of STEMI, hyperlipidemia, diverticulosis. No new labs at this time; Lipids are not at goal- LDL 206. Terry Kerr reports that he enjoys cooking. He does not consistently attend the [facility] education/nutrition series. He is down 4.2# since starting with our program. Patient will benefit from participation in intensive cardiac rehab for nutrition, exercise, and lifestyle modification.             Psychosocial: Target Goals: Acknowledge presence or absence of significant depression and/or stress, maximize coping skills, provide positive support system. Participant is able to verbalize types and ability to use techniques and skills needed for reducing stress and  depression.  Initial Review & Psychosocial Screening:  Initial Psych Review & Screening - 07/30/23 1126       Initial Review   Current issues with None Identified      Family Dynamics   Good Support System? Yes   Wife for support     Barriers   Psychosocial barriers to participate in program There are no identifiable barriers or psychosocial needs.      Screening Interventions   Interventions Encouraged to exercise;Provide feedback about the scores to participant    Expected Outcomes Short Term goal: Identification and review with participant of any Quality of Life or Depression concerns found by scoring the questionnaire.;Long Term goal: The participant improves quality of Life and PHQ9 Scores as seen by post scores and/or verbalization of changes             Quality of Life Scores:  Quality of Life - 07/30/23 1226       Quality of Life   Select Quality of Life      Quality of Life Scores   Health/Function Pre 26.8 %    Socioeconomic Pre 29.64 %    Psych/Spiritual Pre 28.93 %    Family Pre 30 %    GLOBAL Pre 28.29 %            Scores of 19 and below usually indicate a poorer quality of life in these areas.  A difference of  2-3 points is a clinically meaningful difference.  A difference of 2-3 points in the total score of the Quality of Life Index has been associated with significant improvement in overall quality of life, self-image, physical symptoms, and general health in studies assessing change in quality of life.  PHQ-9: Review Flowsheet       07/30/2023  Depression screen PHQ 2/9  Decreased Interest 0  Down, Depressed, Hopeless 0  PHQ - 2 Score 0  Altered sleeping 0  Tired, decreased energy 0  Change in appetite 0  Feeling bad or failure about yourself  0  Trouble concentrating 0  Moving slowly or fidgety/restless 0  Suicidal thoughts 0  PHQ-9 Score 0   Interpretation of Total Score  Total Score Depression Severity:  1-4 = Minimal depression,  5-9 = Mild depression, 10-14 = Moderate depression, 15-19 = Moderately severe depression, 20-27 = Severe depression   Psychosocial Evaluation and Intervention:   Psychosocial Re-Evaluation:  Psychosocial Re-Evaluation     Row Name 08/07/23 0802 08/28/23 0823 09/14/23 1029         Psychosocial Re-Evaluation   Current issues with None Identified None Identified None Identified     Interventions Encouraged to attend Cardiac Rehabilitation for the exercise Encouraged to attend Cardiac Rehabilitation for the exercise Encouraged to attend Cardiac Rehabilitation for the exercise     Continue Psychosocial Services  No Follow up required No  Follow up required No Follow up required              Psychosocial Discharge (Final Psychosocial Re-Evaluation):  Psychosocial Re-Evaluation - 09/14/23 1029       Psychosocial Re-Evaluation   Current issues with None Identified    Interventions Encouraged to attend Cardiac Rehabilitation for the exercise    Continue Psychosocial Services  No Follow up required             Vocational Rehabilitation: Provide vocational rehab assistance to qualifying candidates.   Vocational Rehab Evaluation & Intervention:  Vocational Rehab - 07/30/23 1127       Initial Vocational Rehab Evaluation & Intervention   Assessment shows need for Vocational Rehabilitation No   Terry Kerr is retired            Education: Education Goals: Education classes will be provided on a weekly basis, covering required topics. Participant will state understanding/return demonstration of topics presented.    Education     Row Name 08/06/23 1300     Education   Cardiac Education Topics [facility]   Geographical information systems officer Exercise   Exercise Workshop Exercise Basics: Building Your Action Plan   Instruction Review Code 1- Verbalizes Understanding   Class Start Time 1146   Class Stop Time 1235   Class Time Calculation  (min) 49 min    Row Name 08/08/23 1600     Education   Cardiac Education Topics [facility]   Customer service manager   Weekly Topic Efficiency Cooking - Meals in a Snap   Instruction Review Code 1- Verbalizes Understanding   Class Start Time 1145   Class Stop Time 1225   Class Time Calculation (min) 40 min    Row Name 08/13/23 1500     Education   Cardiac Education Topics [facility]   Glass blower/designer Nutrition   Nutrition Workshop Targeting Your Nutrition Priorities   Instruction Review Code 1- Verbalizes Understanding   Class Start Time 1150   Class Stop Time 1222   Class Time Calculation (min) 32 min    Row Name 08/15/23 1500     Education   Cardiac Education Topics [facility]   Secondary school teacher School   Educator Dietitian   Weekly Topic One-Pot Wonders   Instruction Review Code 1- Verbalizes Understanding   Class Start Time 1145   Class Stop Time 1227   Class Time Calculation (min) 42 min    Row Name 08/17/23 1100     Education   Cardiac Education Topics --   Select --     Core Videos   Educator --   Select --   General Education --   Instruction Review Code --    Row Name 08/20/23 1300     Education   Cardiac Education Topics Pritikin   Select Workshops     Workshops   Educator Exercise Physiologist   Select Psychosocial   Psychosocial Workshop Focused Goals, Sustainable Changes   Instruction Review Code 1- Verbalizes Understanding   Class Start Time 1144   Class Stop Time 1218   Class Time Calculation (min) 34 min    Row Name 08/22/23 1300     Education   Cardiac Education Topics Pritikin   [employer]  [employer]   Scientist, research (physical sciences)   Weekly Topic Comforting Weekend Breakfasts   Instruction Review Code 1- Verbalizes Understanding   Class Start Time 1145   Class Stop Time 1228   Class Time Calculation (min) 43 min     Row Name 08/29/23 1100     Education   Cardiac Education Topics Pritikin   Customer service manager   Weekly Topic Fast Evening Meals   Instruction Review Code 1- Verbalizes Understanding   Class Start Time 1145   Class Stop Time 1225   Class Time Calculation (min) 40 min    Row Name 09/05/23 1500     Education   Cardiac Education Topics Pritikin   Customer service manager   Weekly Topic [employer]isine- Spotlight on the [employer] Zones   Instruction Review Code 1- Verbalizes Understanding   Class Start Time 1145   Class Stop Time 1225   Class Time Calculation (min) 40 min            Core Videos: Exercise    Move It!  Clinical staff conducted group or individual video education with verbal and written material and guidebook.  Patient learns the recommended Pritikin exercise program. Exercise with the goal of living a long, healthy life. Some of the health benefits of exercise include controlled diabetes, healthier blood pressure levels, improved cholesterol levels, improved heart and lung capacity, improved sleep, and better body composition. Everyone should speak with their doctor before starting or changing an exercise routine.  Biomechanical Limitations Clinical staff conducted group or individual video education with verbal and written material and guidebook.  Patient learns how biomechanical limitations can impact exercise and how we can mitigate and possibly overcome limitations to have an impactful and balanced exercise routine.  Body Composition Clinical staff conducted group or individual video education with verbal and written material and guidebook.  Patient learns that body composition (ratio of muscle mass to fat mass) is a key component to assessing overall fitness, rather than body weight alone. Increased fat mass, especially visceral belly fat, can put us  at increased risk for  metabolic syndrome, type 2 diabetes, heart disease, and even death. It is recommended to combine diet and exercise (cardiovascular and resistance training) to improve your body composition. Seek guidance from your physician and exercise physiologist before implementing an exercise routine.  Exercise Action Plan Clinical staff conducted group or individual video education with verbal and written material and guidebook.  Patient learns the recommended strategies to achieve and enjoy long-term exercise adherence, including variety, self-motivation, self-efficacy, and positive decision making. Benefits of exercise include fitness, good health, weight management, more energy, better sleep, less stress, and overall well-being.  Medical   Heart Disease Risk Reduction Clinical staff conducted group or individual video education with verbal and written material and guidebook.  Patient learns our heart is our most vital organ as it circulates oxygen, nutrients, white blood cells, and hormones throughout the entire body, and carries waste away. Data supports a plant-based eating plan like the Pritikin Program for its effectiveness in slowing progression of and reversing heart disease. The video provides a number of recommendations to address heart disease.   Metabolic Syndrome and Belly Fat  Clinical staff conducted group or individual video education with verbal and written material and guidebook.  Patient learns what metabolic syndrome is, how it leads to heart disease, and how one can reverse  it and keep it from coming back. You have metabolic syndrome if you have 3 of the following 5 criteria: abdominal obesity, high blood pressure, high triglycerides, low HDL cholesterol, and high blood sugar.  Hypertension and Heart Disease Clinical staff conducted group or individual video education with verbal and written material and guidebook.  Patient learns that high blood pressure, or hypertension, is very common  in the [place] . Hypertension is largely due to excessive salt intake, but other important risk factors include being overweight, physical inactivity, drinking too much alcohol , smoking, and not eating enough potassium from fruits and vegetables. High blood pressure is a leading risk factor for heart attack, stroke, congestive heart failure, dementia, kidney failure, and premature death. Long-term effects of excessive salt intake include stiffening of the arteries and thickening of heart muscle and organ damage. Recommendations include ways to reduce hypertension and the risk of heart disease.  Diseases of Our Time - Focusing on Diabetes Clinical staff conducted group or individual video education with verbal and written material and guidebook.  Patient learns why the best way to stop diseases of our time is prevention, through food and other lifestyle changes. Medicine (such as prescription pills and surgeries) is often only a Band-Aid on the problem, not a long-term solution. Most common diseases of our time include obesity, type 2 diabetes, hypertension, heart disease, and cancer. The Pritikin Program is recommended and has been proven to help reduce, reverse, and/or prevent the damaging effects of metabolic syndrome.  Nutrition   Overview of the Pritikin Eating Plan  Clinical staff conducted group or individual video education with verbal and written material and guidebook.  Patient learns about the Pritikin Eating Plan for disease risk reduction. The Pritikin Eating Plan emphasizes a wide variety of unrefined, minimally-processed carbohydrates, like fruits, vegetables, whole grains, and legumes. Go, Caution, and Stop food choices are explained. Plant-based and lean animal proteins are emphasized. Rationale provided for low sodium intake for blood pressure control, low added sugars for blood sugar stabilization, and low added fats and oils for coronary artery disease risk reduction and weight  management.  Calorie Density  Clinical staff conducted group or individual video education with verbal and written material and guidebook.  Patient learns about calorie density and how it impacts the Pritikin Eating Plan. Knowing the characteristics of the food you choose will help you decide whether those foods will lead to weight gain or weight loss, and whether you want to consume more or less of them. Weight loss is usually a side effect of the Pritikin Eating Plan because of its focus on low calorie-dense foods.  Label Reading  Clinical staff conducted group or individual video education with verbal and written material and guidebook.  Patient learns about the Pritikin recommended label reading guidelines and corresponding recommendations regarding calorie density, added sugars, sodium content, and whole grains.  Dining Out - Part 1  Clinical staff conducted group or individual video education with verbal and written material and guidebook.  Patient learns that restaurant meals can be sabotaging because they can be so high in calories, fat, sodium, and/or sugar. Patient learns recommended strategies on how to positively address this and avoid unhealthy pitfalls.  Facts on Fats  Clinical staff conducted group or individual video education with verbal and written material and guidebook.  Patient learns that lifestyle modifications can be just as effective, if not more so, as many medications for lowering your risk of heart disease. A Pritikin lifestyle can help to reduce your  risk of inflammation and atherosclerosis (cholesterol build-up, or plaque, in the artery walls). Lifestyle interventions such as dietary choices and physical activity address the cause of atherosclerosis. A review of the types of fats and their impact on blood cholesterol levels, along with dietary recommendations to reduce fat intake is also included.  Nutrition Action Plan  Clinical staff conducted group or individual  video education with verbal and written material and guidebook.  Patient learns how to incorporate Pritikin recommendations into their lifestyle. Recommendations include planning and keeping personal health goals in mind as an important part of their success.  Healthy Mind-Set    Healthy Minds, Bodies, Hearts  Clinical staff conducted group or individual video education with verbal and written material and guidebook.  Patient learns how to identify when they are stressed. Video will discuss the impact of that stress, as well as the many benefits of stress management. Patient will also be introduced to stress management techniques. The way we think, act, and feel has an impact on our hearts.  How Our Thoughts Can Heal Our Hearts  Clinical staff conducted group or individual video education with verbal and written material and guidebook.  Patient learns that negative thoughts can cause depression and anxiety. This can result in negative lifestyle behavior and serious health problems. Cognitive behavioral therapy is an effective method to help control our thoughts in order to change and improve our emotional outlook.  Additional Videos:  Exercise    Improving Performance  Clinical staff conducted group or individual video education with verbal and written material and guidebook.  Patient learns to use a non-linear approach by alternating intensity levels and lengths of time spent exercising to help burn more calories and lose more body fat. Cardiovascular exercise helps improve heart health, metabolism, hormonal balance, blood sugar control, and recovery from fatigue. Resistance training improves strength, endurance, balance, coordination, reaction time, metabolism, and muscle mass. Flexibility exercise improves circulation, posture, and balance. Seek guidance from your physician and exercise physiologist before implementing an exercise routine and learn your capabilities and proper form for all  exercise.  Introduction to Yoga  Clinical staff conducted group or individual video education with verbal and written material and guidebook.  Patient learns about yoga, a discipline of the coming together of mind, breath, and body. The benefits of yoga include improved flexibility, improved range of motion, better posture and core strength, increased lung function, weight loss, and positive self-image. Yoga's heart health benefits include lowered blood pressure, healthier heart rate, decreased cholesterol and triglyceride levels, improved immune function, and reduced stress. Seek guidance from your physician and exercise physiologist before implementing an exercise routine and learn your capabilities and proper form for all exercise.  Medical   Aging: Enhancing Your Quality of Life  Clinical staff conducted group or individual video education with verbal and written material and guidebook.  Patient learns key strategies and recommendations to stay in good physical health and enhance quality of life, such as prevention strategies, having an advocate, securing a Health Care Proxy and Power of Attorney, and keeping a list of medications and system for tracking them. It also discusses how to avoid risk for bone loss.  Biology of Weight Control  Clinical staff conducted group or individual video education with verbal and written material and guidebook.  Patient learns that weight gain occurs because we consume more calories than we burn (eating more, moving less). Even if your body weight is normal, you may have higher ratios of fat compared to muscle  mass. Too much body fat puts you at increased risk for cardiovascular disease, heart attack, stroke, type 2 diabetes, and obesity-related cancers. In addition to exercise, following the Pritikin Eating Plan can help reduce your risk.  Decoding Lab Results  Clinical staff conducted group or individual video education with verbal and written material and  guidebook.  Patient learns that lab test reflects one measurement whose values change over time and are influenced by many factors, including medication, stress, sleep, exercise, food, hydration, pre-existing medical conditions, and more. It is recommended to use the knowledge from this video to become more involved with your lab results and evaluate your numbers to speak with your doctor.   Diseases of Our Time - Overview  Clinical staff conducted group or individual video education with verbal and written material and guidebook.  Patient learns that according to the CDC, 50% to 70% of chronic diseases (such as obesity, type 2 diabetes, elevated lipids, hypertension, and heart disease) are avoidable through lifestyle improvements including healthier food choices, listening to satiety cues, and increased physical activity.  Sleep Disorders Clinical staff conducted group or individual video education with verbal and written material and guidebook.  Patient learns how good quality and duration of sleep are important to overall health and well-being. Patient also learns about sleep disorders and how they impact health along with recommendations to address them, including discussing with a physician.  Nutrition  Dining Out - Part 2 Clinical staff conducted group or individual video education with verbal and written material and guidebook.  Patient learns how to plan ahead and communicate in order to maximize their dining experience in a healthy and nutritious manner. Included are recommended food choices based on the type of restaurant the patient is visiting.   Fueling a Banker conducted group or individual video education with verbal and written material and guidebook.  There is a strong connection between our food choices and our health. Diseases like obesity and type 2 diabetes are very prevalent and are in large-part due to lifestyle choices. The Pritikin Eating Plan  provides plenty of food and hunger-curbing satisfaction. It is easy to follow, affordable, and helps reduce health risks.  Menu Workshop  Clinical staff conducted group or individual video education with verbal and written material and guidebook.  Patient learns that restaurant meals can sabotage health goals because they are often packed with calories, fat, sodium, and sugar. Recommendations include strategies to plan ahead and to communicate with the manager, chef, or server to help order a healthier meal.  Planning Your Eating Strategy  Clinical staff conducted group or individual video education with verbal and written material and guidebook.  Patient learns about the Pritikin Eating Plan and its benefit of reducing the risk of disease. The Pritikin Eating Plan does not focus on calories. Instead, it emphasizes high-quality, nutrient-rich foods. By knowing the characteristics of the foods, we choose, we can determine their calorie density and make informed decisions.  Targeting Your Nutrition Priorities  Clinical staff conducted group or individual video education with verbal and written material and guidebook.  Patient learns that lifestyle habits have a tremendous impact on disease risk and progression. This video provides eating and physical activity recommendations based on your personal health goals, such as reducing LDL cholesterol, losing weight, preventing or controlling type 2 diabetes, and reducing high blood pressure.  Vitamins and Minerals  Clinical staff conducted group or individual video education with verbal and written material and guidebook.  Patient learns  different ways to obtain key vitamins and minerals, including through a recommended healthy diet. It is important to discuss all supplements you take with your doctor.   Healthy Mind-Set    Smoking Cessation  Clinical staff conducted group or individual video education with verbal and written material and guidebook.   Patient learns that cigarette smoking and tobacco addiction pose a serious health risk which affects millions of people. Stopping smoking will significantly reduce the risk of heart disease, lung disease, and many forms of cancer. Recommended strategies for quitting are covered, including working with your doctor to develop a successful plan.  Culinary   Becoming a Set designer conducted group or individual video education with verbal and written material and guidebook.  Patient learns that cooking at home can be healthy, cost-effective, quick, and puts them in control. Keys to cooking healthy recipes will include looking at your recipe, assessing your equipment needs, planning ahead, making it simple, choosing cost-effective seasonal ingredients, and limiting the use of added fats, salts, and sugars.  Cooking - Breakfast and Snacks  Clinical staff conducted group or individual video education with verbal and written material and guidebook.  Patient learns how important breakfast is to satiety and nutrition through the entire day. Recommendations include key foods to eat during breakfast to help stabilize blood sugar levels and to prevent overeating at meals later in the day. Planning ahead is also a key component.  Cooking - Educational psychologist conducted group or individual video education with verbal and written material and guidebook.  Patient learns eating strategies to improve overall health, including an approach to cook more at home. Recommendations include thinking of animal protein as a side on your plate rather than center stage and focusing instead on lower calorie dense options like vegetables, fruits, whole grains, and plant-based proteins, such as beans. Making sauces in large quantities to freeze for later and leaving the skin on your vegetables are also recommended to maximize your experience.  Cooking - Healthy Salads and Dressing Clinical staff  conducted group or individual video education with verbal and written material and guidebook.  Patient learns that vegetables, fruits, whole grains, and legumes are the foundations of the Pritikin Eating Plan. Recommendations include how to incorporate each of these in flavorful and healthy salads, and how to create homemade salad dressings. Proper handling of ingredients is also covered. Cooking - Soups and [employer] - Soups and Desserts Clinical staff conducted group or individual video education with verbal and written material and guidebook.  Patient learns that Pritikin soups and desserts make for easy, nutritious, and delicious snacks and meal components that are low in sodium, fat, sugar, and calorie density, while high in vitamins, minerals, and filling fiber. Recommendations include simple and healthy ideas for soups and desserts.   Overview     The Pritikin Solution Program Overview Clinical staff conducted group or individual video education with verbal and written material and guidebook.  Patient learns that the results of the Pritikin Program have been documented in more than 100 articles published in peer-reviewed journals, and the benefits include reducing risk factors for (and, in some cases, even reversing) high cholesterol, high blood pressure, type 2 diabetes, obesity, and more! An overview of the three key pillars of the Pritikin Program will be covered: eating well, doing regular exercise, and having a healthy mind-set.  WORKSHOPS  Exercise: Exercise Basics: Building Your Action Plan Clinical staff led group instruction and group discussion  with PowerPoint presentation and patient guidebook. To enhance the learning environment the use of posters, models and videos may be added. At the conclusion of this workshop, patients will comprehend the difference between physical activity and exercise, as well as the benefits of incorporating both, into their routine. Patients will  understand the FITT (Frequency, Intensity, Time, and Type) principle and how to use it to build an exercise action plan. In addition, safety concerns and other considerations for exercise and cardiac rehab will be addressed by the presenter. The purpose of this lesson is to promote a comprehensive and effective weekly exercise routine in order to improve patients' overall level of fitness.   Managing Heart Disease: Your Path to a Healthier Heart Clinical staff led group instruction and group discussion with PowerPoint presentation and patient guidebook. To enhance the learning environment the use of posters, models and videos may be added.At the conclusion of this workshop, patients will understand the anatomy and physiology of the heart. Additionally, they will understand how Pritikin's three pillars impact the risk factors, the progression, and the management of heart disease.  The purpose of this lesson is to provide a high-level overview of the heart, heart disease, and how the Pritikin lifestyle positively impacts risk factors.  Exercise Biomechanics Clinical staff led group instruction and group discussion with PowerPoint presentation and patient guidebook. To enhance the learning environment the use of posters, models and videos may be added. Patients will learn how the structural parts of their bodies function and how these functions impact their daily activities, movement, and exercise. Patients will learn how to promote a neutral spine, learn how to manage pain, and identify ways to improve their physical movement in order to promote healthy living. The purpose of this lesson is to expose patients to common physical limitations that impact physical activity. Participants will learn practical ways to adapt and manage aches and pains, and to minimize their effect on regular exercise. Patients will learn how to maintain good posture while sitting, walking, and lifting.  Balance Training  and Fall Prevention  Clinical staff led group instruction and group discussion with PowerPoint presentation and patient guidebook. To enhance the learning environment the use of posters, models and videos may be added. At the conclusion of this workshop, patients will understand the importance of their sensorimotor skills (vision, proprioception, and the vestibular system) in maintaining their ability to balance as they age. Patients will apply a variety of balancing exercises that are appropriate for their current level of function. Patients will understand the common causes for poor balance, possible solutions to these problems, and ways to modify their physical environment in order to minimize their fall risk. The purpose of this lesson is to teach patients about the importance of maintaining balance as they age and ways to minimize their risk of falling.  WORKSHOPS   Nutrition:  Fueling a Ship broker led group instruction and group discussion with PowerPoint presentation and patient guidebook. To enhance the learning environment the use of posters, models and videos may be added. Patients will review the foundational principles of the Pritikin Eating Plan and understand what constitutes a serving size in each of the food groups. Patients will also learn Pritikin-friendly foods that are better choices when away from home and review make-ahead meal and snack options. Calorie density will be reviewed and applied to three nutrition priorities: weight maintenance, weight loss, and weight gain. The purpose of this lesson is to reinforce (in a group setting) the key  concepts around what patients are recommended to eat and how to apply these guidelines when away from home by planning and selecting Pritikin-friendly options. Patients will understand how calorie density may be adjusted for different weight management goals.  Mindful Eating  Clinical staff led group instruction and group  discussion with PowerPoint presentation and patient guidebook. To enhance the learning environment the use of posters, models and videos may be added. Patients will briefly review the concepts of the Pritikin Eating Plan and the importance of low-calorie dense foods. The concept of mindful eating will be introduced as well as the importance of paying attention to internal hunger signals. Triggers for non-hunger eating and techniques for dealing with triggers will be explored. The purpose of this lesson is to provide patients with the opportunity to review the basic principles of the Pritikin Eating Plan, discuss the value of eating mindfully and how to measure internal cues of hunger and fullness using the Hunger Scale. Patients will also discuss reasons for non-hunger eating and learn strategies to use for controlling emotional eating.  Targeting Your Nutrition Priorities Clinical staff led group instruction and group discussion with PowerPoint presentation and patient guidebook. To enhance the learning environment the use of posters, models and videos may be added. Patients will learn how to determine their genetic susceptibility to disease by reviewing their family history. Patients will gain insight into the importance of diet as part of an overall healthy lifestyle in mitigating the impact of genetics and other environmental insults. The purpose of this lesson is to provide patients with the opportunity to assess their personal nutrition priorities by looking at their family history, their own health history and current risk factors. Patients will also be able to discuss ways of prioritizing and modifying the Pritikin Eating Plan for their highest risk areas  Menu  Clinical staff led group instruction and group discussion with PowerPoint presentation and patient guidebook. To enhance the learning environment the use of posters, models and videos may be added. Using menus brought in from [employer],  or printed from [employer], patients will apply the Pritikin dining out guidelines that were presented in the [employer] video. Patients will also be able to practice these guidelines in a variety of provided scenarios. The purpose of this lesson is to provide patients with the opportunity to practice hands-on learning of the Pritikin Dining Out guidelines with actual menus and practice scenarios.  Label Reading Clinical staff led group instruction and group discussion with PowerPoint presentation and patient guidebook. To enhance the learning environment the use of posters, models and videos may be added. Patients will review and discuss the Pritikin label reading guidelines presented in [employer]in's Label Reading Educational series video. Using fool labels brought in from local grocery stores and markets, patients will apply the label reading guidelines and determine if the packaged food meet the Pritikin guidelines. The purpose of this lesson is to provide patients with the opportunity to review, discuss, and practice hands-on learning of the Pritikin Label Reading guidelines with actual packaged food labels. Cooking School  Pritikin's [employer] are designed to teach patients ways to prepare quick, simple, and affordable recipes at home. The importance of nutrition's role in chronic disease risk reduction is reflected in its emphasis in the overall Pritikin program. By learning how to prepare essential core Pritikin Eating Plan recipes, patients will increase control over what they eat; be able to customize the flavor of foods without the use of added salt,  sugar, or fat; and improve the quality of the food they consume. By learning a set of core recipes which are easily assembled, quickly prepared, and affordable, patients are more likely to prepare more healthy foods at home. These workshops focus on convenient breakfasts, simple entres, side dishes, and desserts which  can be prepared with minimal effort and are consistent with nutrition recommendations for cardiovascular risk reduction. Cooking [employer] are taught by a Armed forces logistics/support/administrative officer (RD) who has been trained by the [employer]. The chef or RD has a clear understanding of the importance of minimizing - if not completely eliminating - added fat, sugar, and sodium in recipes. Throughout the series of Cooking School Workshop sessions, patients will learn about healthy ingredients and efficient methods of cooking to build confidence in their capability to prepare    Cooking School weekly topics:  Adding Flavor- Sodium-Free  Fast and Healthy Breakfasts  Powerhouse Plant-Based Proteins  Satisfying Salads and Dressings  Simple Sides and Sauces  International Cuisine-Spotlight on the [employer] Zones  Delicious Desserts  Savory Soups  [employer] - Meals in a Astronomer Appetizers and Snacks  Comforting Weekend Breakfasts  One-Pot Wonders   Fast Evening Meals  Landscape architect Your Pritikin Plate  WORKSHOPS   Healthy Mindset (Psychosocial):  Focused Goals, Sustainable Changes Clinical staff led group instruction and group discussion with PowerPoint presentation and patient guidebook. To enhance the learning environment the use of posters, models and videos may be added. Patients will be able to apply effective goal setting strategies to establish at least one personal goal, and then take consistent, meaningful action toward that goal. They will learn to identify common barriers to achieving personal goals and develop strategies to overcome them. Patients will also gain an understanding of how our mind-set can impact our ability to achieve goals and the importance of cultivating a positive and growth-oriented mind-set. The purpose of this lesson is to provide patients with a deeper understanding of how to set and achieve personal goals, as well as the tools  and strategies needed to overcome common obstacles which may arise along the way.  From Head to Heart: The Power of a Healthy Outlook  Clinical staff led group instruction and group discussion with PowerPoint presentation and patient guidebook. To enhance the learning environment the use of posters, models and videos may be added. Patients will be able to recognize and describe the impact of emotions and mood on physical health. They will discover the importance of self-care and explore self-care practices which may work for them. Patients will also learn how to utilize the 4 C's to cultivate a healthier outlook and better manage stress and challenges. The purpose of this lesson is to demonstrate to patients how a healthy outlook is an essential part of maintaining good health, especially as they continue their cardiac rehab journey.  Healthy Sleep for a Healthy Heart Clinical staff led group instruction and group discussion with PowerPoint presentation and patient guidebook. To enhance the learning environment the use of posters, models and videos may be added. At the conclusion of this workshop, patients will be able to demonstrate knowledge of the importance of sleep to overall health, well-being, and quality of life. They will understand the symptoms of, and treatments for, common sleep disorders. Patients will also be able to identify daytime and nighttime behaviors which impact sleep, and they will be able to apply these tools to help manage sleep-related challenges. The purpose of  this lesson is to provide patients with a general overview of sleep and outline the importance of quality sleep. Patients will learn about a few of the most common sleep disorders. Patients will also be introduced to the concept of "sleep hygiene," and discover ways to self-manage certain sleeping problems through simple daily behavior changes. Finally, the workshop will motivate patients by clarifying the links between  quality sleep and their goals of heart-healthy living.   Recognizing and Reducing Stress Clinical staff led group instruction and group discussion with PowerPoint presentation and patient guidebook. To enhance the learning environment the use of posters, models and videos may be added. At the conclusion of this workshop, patients will be able to understand the types of stress reactions, differentiate between acute and chronic stress, and recognize the impact that chronic stress has on their health. They will also be able to apply different coping mechanisms, such as reframing negative self-talk. Patients will have the opportunity to practice a variety of stress management techniques, such as deep abdominal breathing, progressive muscle relaxation, and/or guided imagery.  The purpose of this lesson is to educate patients on the role of stress in their lives and to provide healthy techniques for coping with it.  Learning Barriers/Preferences:  Learning Barriers/Preferences - 07/30/23 1127       Learning Barriers/Preferences   Learning Barriers Sight   wears glasses   Learning Preferences Audio;Computer/Internet;Group Instruction;Individual Instruction;Pictoral;Skilled Demonstration;Verbal Instruction;Written Material;Video             Education Topics:  Knowledge Questionnaire Score:  Knowledge Questionnaire Score - 07/30/23 1134       Knowledge Questionnaire Score   Pre Score 23/24             Core Components/Risk Factors/Patient Goals at Admission:  Personal Goals and Risk Factors at Admission - 07/30/23 1128       Core Components/Risk Factors/Patient Goals on Admission    Weight Management Yes    Intervention Weight Management: Develop a combined nutrition and exercise program designed to reach desired caloric intake, while maintaining appropriate intake of nutrient and fiber, sodium and fats, and appropriate energy expenditure required for the weight goal.;Weight Management:  Provide education and appropriate resources to help participant work on and attain dietary goals.    Expected Outcomes Short Term: Continue to assess and modify interventions until short term weight is achieved;Long Term: Adherence to nutrition and physical activity/exercise program aimed toward attainment of established weight goal;Understanding recommendations for meals to include 15-35% energy as protein, 25-35% energy from fat, 35-60% energy from carbohydrates, less than 200mg  of dietary cholesterol, 20-35 gm of total fiber daily;Understanding of distribution of calorie intake throughout the day with the consumption of 4-5 meals/snacks    Hypertension Yes    Intervention Provide education on lifestyle modifcations including regular physical activity/exercise, weight management, moderate sodium restriction and increased consumption of fresh fruit, vegetables, and low fat dairy, alcohol  moderation, and smoking cessation.;Monitor prescription use compliance.    Expected Outcomes Short Term: Continued assessment and intervention until BP is < 140/65mm HG in hypertensive participants. < 130/82mm HG in hypertensive participants with diabetes, heart failure or chronic kidney disease.;Long Term: Maintenance of blood pressure at goal levels.    Lipids Yes    Intervention Provide education and support for participant on nutrition & aerobic/resistive exercise along with prescribed medications to achieve LDL 70mg , HDL >40mg .    Expected Outcomes Short Term: Participant states understanding of desired cholesterol values and is compliant with medications prescribed. Participant is  following exercise prescription and nutrition guidelines.;Long Term: Cholesterol controlled with medications as prescribed, with individualized exercise RX and with personalized nutrition plan. Value goals: LDL < 70mg , HDL > 40 mg.             Core Components/Risk Factors/Patient Goals Review:   Goals and Risk Factor Review      Row Name 08/07/23 0803 08/28/23 0823 09/14/23 1030         Core Components/Risk Factors/Patient Goals Review   Personal Goals Review Weight Management/Obesity;Hypertension;Lipids Weight Management/Obesity;Hypertension;Lipids Weight Management/Obesity;Hypertension;Lipids     Review Terry Kerr started cardiac rehab on 08/06/23. Terry Kerr did well with exercise. Vital signs were stable. Terry Kerr is doing  well with exercise at cardiac rehab. Vital signs have been  stable. Terry Kerr continues to do well with exercise at cardiac rehab. Vital signs remain stable. Terry Kerr has been increasing his met levels.     Expected Outcomes Terry Kerr will continue to participate in cardiac rehab for exercise, nutrition and lifesytle modifications Terry Kerr will continue to participate in cardiac rehab for exercise, nutrition and lifesytle modifications Terry Kerr will continue to participate in cardiac rehab for exercise, nutrition and lifesytle modifications              Core Components/Risk Factors/Patient Goals at Discharge (Final Review):   Goals and Risk Factor Review - 09/14/23 1030       Core Components/Risk Factors/Patient Goals Review   Personal Goals Review Weight Management/Obesity;Hypertension;Lipids    Review Terry Kerr continues to do well with exercise at cardiac rehab. Vital signs remain stable. Terry Kerr has been increasing his met levels.    Expected Outcomes Terry Kerr will continue to participate in cardiac rehab for exercise, nutrition and lifesytle modifications             ITP Comments:  ITP Comments     Row Name 07/30/23 1124 08/07/23 0759 08/28/23 0822 09/14/23 1029     ITP Comments Dr. Gaylyn Keas medical director. Introduction to pritikin education/intensive cardiac rehab. Initial orientation packet reviewed with patient. 30 Day ITP Review. Terry Kerr started cardiac rehab on 08/07/23. Terry Kerr did well with exercise. 30 Day ITP Review. Terry Kerr has good attendance and particpation with exercise at cardiac rehab 30 Day ITP Review. Terry Kerr has good attendance  and particpation with exercise at cardiac rehab             Comments: See ITP Comments

## 2023-09-19 ENCOUNTER — Encounter (HOSPITAL_COMMUNITY)
Admission: RE | Admit: 2023-09-19 | Discharge: 2023-09-19 | Disposition: A | Discharge status: Home / Self Care | Payer: Medicare Other | Source: Ambulatory Visit | Attending: Cardiology | Admitting: Cardiology

## 2023-09-19 DIAGNOSIS — I2102 ST elevation (STEMI) myocardial infarction involving left anterior descending coronary artery: Secondary | ICD-10-CM

## 2023-09-19 DIAGNOSIS — Z955 Presence of coronary angioplasty implant and graft: Secondary | ICD-10-CM

## 2023-09-21 ENCOUNTER — Encounter (HOSPITAL_COMMUNITY)
Admission: RE | Admit: 2023-09-21 | Discharge: 2023-09-21 | Disposition: A | Discharge status: Home / Self Care | Payer: Medicare Other | Source: Ambulatory Visit | Attending: Cardiology | Admitting: Cardiology

## 2023-09-21 DIAGNOSIS — I2102 ST elevation (STEMI) myocardial infarction involving left anterior descending coronary artery: Secondary | ICD-10-CM | POA: Diagnosis not present

## 2023-09-21 DIAGNOSIS — Z955 Presence of coronary angioplasty implant and graft: Secondary | ICD-10-CM

## 2023-09-21 LAB — GENECONNECT MOLECULAR SCREEN: Genetic Analysis Overall Interpretation: NEGATIVE

## 2023-09-24 ENCOUNTER — Encounter (HOSPITAL_COMMUNITY): Admission: RE | Admit: 2023-09-24 | Payer: Medicare Other | Source: Ambulatory Visit

## 2023-09-26 ENCOUNTER — Encounter (HOSPITAL_COMMUNITY)
Admission: RE | Admit: 2023-09-26 | Discharge: 2023-09-26 | Disposition: A | Discharge status: Home / Self Care | Payer: Medicare Other | Source: Ambulatory Visit | Attending: Cardiology | Admitting: Cardiology

## 2023-09-26 DIAGNOSIS — Z955 Presence of coronary angioplasty implant and graft: Secondary | ICD-10-CM

## 2023-09-26 DIAGNOSIS — I2102 ST elevation (STEMI) myocardial infarction involving left anterior descending coronary artery: Secondary | ICD-10-CM | POA: Diagnosis not present

## 2023-09-28 ENCOUNTER — Encounter (HOSPITAL_COMMUNITY)
Admission: RE | Admit: 2023-09-28 | Discharge: 2023-09-28 | Disposition: A | Discharge status: Home / Self Care | Payer: Medicare Other | Source: Ambulatory Visit | Attending: Cardiology | Admitting: Cardiology

## 2023-09-28 DIAGNOSIS — I2102 ST elevation (STEMI) myocardial infarction involving left anterior descending coronary artery: Secondary | ICD-10-CM

## 2023-09-28 DIAGNOSIS — Z955 Presence of coronary angioplasty implant and graft: Secondary | ICD-10-CM

## 2023-10-01 ENCOUNTER — Encounter (HOSPITAL_COMMUNITY)
Admission: RE | Admit: 2023-10-01 | Discharge: 2023-10-01 | Disposition: A | Discharge status: Home / Self Care | Payer: Medicare Other | Source: Ambulatory Visit | Attending: Cardiology | Admitting: Cardiology

## 2023-10-01 DIAGNOSIS — Z955 Presence of coronary angioplasty implant and graft: Secondary | ICD-10-CM

## 2023-10-01 DIAGNOSIS — I2102 ST elevation (STEMI) myocardial infarction involving left anterior descending coronary artery: Secondary | ICD-10-CM | POA: Diagnosis not present

## 2023-10-03 ENCOUNTER — Encounter (HOSPITAL_COMMUNITY): Admission: RE | Admit: 2023-10-03 | Payer: Medicare Other | Source: Ambulatory Visit

## 2023-10-05 ENCOUNTER — Encounter (HOSPITAL_COMMUNITY)
Admission: RE | Admit: 2023-10-05 | Discharge: 2023-10-05 | Disposition: A | Discharge status: Home / Self Care | Payer: Medicare Other | Source: Ambulatory Visit | Attending: Cardiology | Admitting: Cardiology

## 2023-10-05 DIAGNOSIS — I2102 ST elevation (STEMI) myocardial infarction involving left anterior descending coronary artery: Secondary | ICD-10-CM | POA: Diagnosis not present

## 2023-10-05 DIAGNOSIS — Z955 Presence of coronary angioplasty implant and graft: Secondary | ICD-10-CM

## 2023-10-08 ENCOUNTER — Encounter (HOSPITAL_COMMUNITY): Admission: RE | Admit: 2023-10-08 | Payer: Medicare Other | Source: Ambulatory Visit

## 2023-10-09 ENCOUNTER — Ambulatory Visit: Admitting: Pharmacist

## 2023-10-10 ENCOUNTER — Encounter (HOSPITAL_COMMUNITY): Payer: Medicare Other

## 2023-10-12 ENCOUNTER — Encounter (HOSPITAL_COMMUNITY): Admission: RE | Admit: 2023-10-12 | Payer: Medicare Other | Source: Ambulatory Visit

## 2023-10-15 ENCOUNTER — Encounter (HOSPITAL_COMMUNITY)
Admission: RE | Admit: 2023-10-15 | Discharge: 2023-10-15 | Disposition: A | Discharge status: Home / Self Care | Payer: Medicare Other | Source: Ambulatory Visit | Attending: Cardiology | Admitting: Cardiology

## 2023-10-15 DIAGNOSIS — Z955 Presence of coronary angioplasty implant and graft: Secondary | ICD-10-CM

## 2023-10-15 DIAGNOSIS — I2102 ST elevation (STEMI) myocardial infarction involving left anterior descending coronary artery: Secondary | ICD-10-CM | POA: Diagnosis not present

## 2023-10-17 ENCOUNTER — Encounter (HOSPITAL_COMMUNITY)
Admission: RE | Admit: 2023-10-17 | Discharge: 2023-10-17 | Disposition: A | Discharge status: Home / Self Care | Payer: Medicare Other | Source: Ambulatory Visit | Attending: Cardiology | Admitting: Cardiology

## 2023-10-17 DIAGNOSIS — Z955 Presence of coronary angioplasty implant and graft: Secondary | ICD-10-CM

## 2023-10-17 DIAGNOSIS — I2102 ST elevation (STEMI) myocardial infarction involving left anterior descending coronary artery: Secondary | ICD-10-CM | POA: Diagnosis not present

## 2023-10-17 NOTE — Progress Notes (Signed)
 Cardiac Individual Treatment Plan  Patient Details  Name: Terry Kerr MRN: [id number] Date of Birth: 1955-11-23 Referring Provider:   Flowsheet Row INTENSIVE CARDIAC REHAB ORIENT from 07/30/2023 in [facility]  Referring Provider Eilleen Grates, MD       Initial Encounter Date:  Flowsheet Row INTENSIVE CARDIAC REHAB ORIENT from 07/30/2023 in [facility] for Heart, Vascular, & Lung Health  Date 07/30/23       Visit Diagnosis: 07/16/23 STEMI  07/16/23 DES LAD  Patient's Home Medications on Admission:  Current Outpatient Medications:    aspirin  EC 81 MG tablet, Take 1 tablet (81 mg total) by mouth daily. Swallow whole., Disp: 90 tablet, Rfl: 2   atorvastatin  (LIPITOR) 80 MG tablet, Take 1 tablet (80 mg total) by mouth daily., Disp: 90 tablet, Rfl: 1   carvedilol  (COREG ) 6.25 MG tablet, Take 1 tablet (6.25 mg total) by mouth 2 (two) times daily with a meal., Disp: 60 tablet, Rfl: 2   Cholecalciferol (VITAMIN D ) 2000 UNITS tablet, Take 2,000 Units by mouth daily., Disp: , Rfl:    fexofenadine (ALLEGRA) 30 MG tablet, Take 30 mg by mouth daily as needed (for allergies).  (Patient not taking: Reported on 09/10/2023), Disp: , Rfl:    HUMIRA , 2 PEN, 40 MG/0.4ML pen, INJECT 1 PEN UNDER THE SKIN EVERY 14 DAYS., Disp: 2 each, Rfl: 3   hyoscyamine  (LEVSIN SL) 0.125 MG SL tablet, Take 1-2 tablet every 4-6 hours as needed for abdominal pain/spasm (Patient not taking: Reported on 09/10/2023), Disp: 120 tablet, Rfl: 1   losartan  (COZAAR ) 25 MG tablet, Take 1 tablet (25 mg total) by mouth daily., Disp: 90 tablet, Rfl: 1   nitroGLYCERIN  (NITROSTAT ) 0.4 MG SL tablet, Place 1 tablet (0.4 mg total) under the tongue every 5 (five) minutes as needed for chest pain (Do not give more than 3 SL tablets in 15 minutes.). (Patient not taking: Reported on 09/10/2023), Disp: 25 tablet, Rfl: 2   prasugrel  (EFFIENT ) 10 MG TABS tablet, TAKE  1 TABLET(10 MG) BY MOUTH DAILY, Disp: 90 tablet, Rfl: 3   spironolactone  (ALDACTONE ) 25 MG tablet, Take 0.5 tablets (12.5 mg total) by mouth daily., Disp: 30 tablet, Rfl: 1  Past Medical History: Past Medical History:  Diagnosis Date   Allergic rhinitis    pollen, animal hair, dust mites   Allergy    Crohn disease (HCC)    Diverticulosis    Hemorrhoids, internal, thrombosed s/p resection/pexy 12/06/2012 12/04/2012   HLD (hyperlipidemia)    Obesity    Regional enteritis (Crohn's disease) (HCC) 08/01/2007   Qualifier: Diagnosis of  By: Roxine Cordia MD, Candie Chamber    Small bowel obstruction [facility])     Tobacco Use: Social History   Tobacco Use  Smoking Status Never  Smokeless Tobacco Never    Labs: Review Flowsheet  More data exists      Latest Ref Rng & Units 12/19/2007 12/04/2008 02/23/2010 04/26/2010 07/16/2023  Labs for ITP Cardiac and Pulmonary Rehab  Cholestrol 0 - 200 mg/dL 409  811  - 914  782   LDL (calc) 0 - 99 mg/dL 956  213  - - 086   Direct LDL mg/dL - - - 578.4  -  HDL-C >69 mg/dL 62.9  52.84  - 13.24  49   Trlycerides <150 mg/dL 401  027.2  - 536.6  440   Hemoglobin A1c 4.8 - 5.6 % - - - - 5.0  TCO2 0 - 100 mmol/L - - 22  - -    Capillary Blood Glucose: No results found for: "GLUCAP"   Exercise Target Goals: Exercise Program Goal: Individual exercise prescription set using results from initial 6 min walk test and THRR while considering  patient's activity barriers and safety.   Exercise Prescription Goal: Initial exercise prescription builds to 30-45 minutes a day of aerobic activity, 2-3 days per week.  Home exercise guidelines will be given to patient during program as part of exercise prescription that the participant will acknowledge.  Activity Barriers & Risk Stratification:  Activity Barriers & Cardiac Risk Stratification - 07/30/23 1125       Activity Barriers & Cardiac Risk Stratification   Activity Barriers Neck/Spine Problems;Joint Problems     Cardiac Risk Stratification High   <5 METs on            6 Minute Walk:  6 Minute Walk     Row Name 07/30/23 1224         6 Minute Walk   Phase Initial     Distance 1860 feet     Walk Time 6 minutes     # of Rest Breaks 0     MPH 3.52     METS 4.03     RPE 9     Perceived Dyspnea  0     VO2 Peak 14.1     Symptoms No     Resting HR 57 bpm     Resting BP 112/70     Resting Oxygen Saturation  99 %     Exercise Oxygen Saturation  during 6 min walk 99 %     Max Ex. HR 97 bpm     Max Ex. BP 128/68     2 Minute Post BP 118/66              Oxygen Initial Assessment:   Oxygen Re-Evaluation:   Oxygen Discharge (Final Oxygen Re-Evaluation):   Initial Exercise Prescription:  Initial Exercise Prescription - 07/30/23 1200       Date of Initial Exercise RX and Referring Provider   Date 07/30/23    Referring Provider Eilleen Grates, MD    Expected Discharge Date 10/24/23      Treadmill   MPH 3.2    Grade 0    Minutes 15    METs 3.5      Recumbant Elliptical   Level 2    RPM 60    Watts 80    Minutes 15    METs 3.5      Prescription Details   Frequency (times per week) 3    Duration Progress to 30 minutes of continuous aerobic without signs/symptoms of physical distress      Intensity   THRR 40-80% of Max Heartrate 61-122    Ratings of Perceived Exertion 11-13    Perceived Dyspnea 0-4      Progression   Progression Continue progressive overload as per policy without signs/symptoms or physical distress.      Resistance Training   Training Prescription Yes    Weight 4    Reps 10-15             Perform Capillary Blood Glucose checks as needed.  Exercise Prescription Changes:   Exercise Prescription Changes     Row Name 08/06/23 1025 08/17/23 1023 08/27/23 1025 09/17/23 1029 09/28/23 1029     Response to Exercise   Blood Pressure (Admit) 102/54 110/56 108/60 118/70  118/74   Blood Pressure (Exercise) 122/60 128/80 -- -- --    Blood Pressure (Exit) 104/62 100/58 96/58 112/62 106/58   Heart Rate (Admit) 60 bpm 58 bpm 53 bpm 54 bpm 76 bpm   Heart Rate (Exercise) 108 bpm 101 bpm 86 bpm 109 bpm 108 bpm   Heart Rate (Exit) 67 bpm 67 bpm 61 bpm 63 bpm 73 bpm   Rating of Perceived Exertion (Exercise) 11 11 12 12 12    Symptoms None None None None None   Comments Off to a good start with exercise. Reviewed home exercise guidelines and goals with Terry Kerr. Increased weights today. Reviewed METs with Terry Kerr. Increased workload on recumbent elliptical today. -- --   Duration Continue with 30 min of aerobic exercise without signs/symptoms of physical distress. Continue with 30 min of aerobic exercise without signs/symptoms of physical distress. Continue with 30 min of aerobic exercise without signs/symptoms of physical distress. Continue with 30 min of aerobic exercise without signs/symptoms of physical distress. Continue with 30 min of aerobic exercise without signs/symptoms of physical distress.   Intensity THRR unchanged THRR unchanged THRR unchanged THRR unchanged THRR unchanged     Progression   Progression Continue to progress workloads to maintain intensity without signs/symptoms of physical distress. Continue to progress workloads to maintain intensity without signs/symptoms of physical distress. Continue to progress workloads to maintain intensity without signs/symptoms of physical distress. Continue to progress workloads to maintain intensity without signs/symptoms of physical distress. Continue to progress workloads to maintain intensity without signs/symptoms of physical distress.   Average METs 3.5 5.3 4.7 6.3 5.8     Resistance Training   Training Prescription Yes Yes Yes Yes Yes   Weight 4 lbs 8 lbs 8 lbs 8 lbs 8 lbs   Reps 10-15 10-15 10-15 10-15 10-15   Time 10 Minutes 10 Minutes 10 Minutes 10 Minutes 10 Minutes     Interval Training   Interval Training No No No No No     Treadmill   MPH 3 3.5 3.2 3.5 4   Grade 0 3  3.5 4 3.5   Minutes 15 15 15 15 15    METs 3.3 5.13 4.99 5.61 5.99     Recumbant Elliptical   Level 2 2 4 4 5    RPM 58 69 69 81 71   Watts 75 66 92 128 118   Minutes 15 15 15 15 15    METs 3.7 5.5 4.4 7.1 5.6     Home Exercise Plan   Plans to continue exercise at -- Home (comment)  Walking Home (comment)  Walking Home (comment)  Walking Home (comment)  Walking   Frequency -- Add 4 additional days to program exercise sessions. Add 4 additional days to program exercise sessions. Add 4 additional days to program exercise sessions. Add 4 additional days to program exercise sessions.   Initial Home Exercises Provided -- 08/17/23 08/17/23 08/17/23 08/17/23    Row Name 10/15/23 1021             Response to Exercise   Blood Pressure (Admit) 128/60       Blood Pressure (Exit) 102/60       Heart Rate (Admit) 55 bpm       Heart Rate (Exercise) 106 bpm       Heart Rate (Exit) 64 bpm       Rating of Perceived Exertion (Exercise) 12       Symptoms None       Comments Reviewed goals with  Terry Kerr.       Duration Continue with 30 min of aerobic exercise without signs/symptoms of physical distress.       Intensity THRR unchanged         Progression   Progression Continue to progress workloads to maintain intensity without signs/symptoms of physical distress.       Average METs 5.9         Resistance Training   Training Prescription Yes       Weight 8 lbs       Reps 10-15       Time 10 Minutes         Interval Training   Interval Training No         Treadmill   MPH 3.5       Grade 4.5       Minutes 15       METs 5.85         Recumbant Elliptical   Level 5       RPM 67       Watts 114       Minutes 15       METs 6         Home Exercise Plan   Plans to continue exercise at Home (comment)  Walking       Frequency Add 4 additional days to program exercise sessions.       Initial Home Exercises Provided 08/17/23                Exercise Comments:   Exercise Comments      Row Name 08/06/23 1127 08/17/23 1026 08/27/23 1102 09/19/23 1110 10/15/23 1044   Exercise Comments Ken tolerated first session of exercise well without symptoms. Oriented to warm-up/cool-down and exercise routine. Reviewed home exercise guidelines and goals with Terry Kerr. Reviewed METs with Terry Kerr. Reviewed METs and goals with Terry Kerr. Reviewed goals with Terry Kerr.            Exercise Goals and Review:   Exercise Goals     Row Name 07/30/23 1126             Exercise Goals   Increase Physical Activity Yes       Intervention Provide advice, education, support and counseling about physical activity/exercise needs.;Develop an individualized exercise prescription for aerobic and resistive training based on initial evaluation findings, risk stratification, comorbidities and participant's personal goals.       Expected Outcomes Short Term: Attend rehab on a regular basis to increase amount of physical activity.;Long Term: Exercising regularly at least 3-5 days a week.;Long Term: Add in home exercise to make exercise part of routine and to increase amount of physical activity.       Increase Strength and Stamina Yes       Intervention Provide advice, education, support and counseling about physical activity/exercise needs.;Develop an individualized exercise prescription for aerobic and resistive training based on initial evaluation findings, risk stratification, comorbidities and participant's personal goals.       Expected Outcomes Short Term: Increase workloads from initial exercise prescription for resistance, speed, and METs.;Long Term: Improve cardiorespiratory fitness, muscular endurance and strength as measured by increased METs and functional capacity ( );Short Term: Perform resistance training exercises routinely during rehab and add in resistance training at home       Able to understand and use rate of perceived exertion (RPE) scale Yes       Intervention Provide education and explanation on how to  use RPE scale  Expected Outcomes Long Term:  Able to use RPE to guide intensity level when exercising independently;Short Term: Able to use RPE daily in rehab to express subjective intensity level       Knowledge and understanding of Target Heart Rate Range (THRR) Yes       Intervention Provide education and explanation of THRR including how the numbers were predicted and where they are located for reference       Expected Outcomes Short Term: Able to state/look up THRR;Short Term: Able to use daily as guideline for intensity in rehab;Long Term: Able to use THRR to govern intensity when exercising independently       Understanding of Exercise Prescription Yes       Intervention Provide education, explanation, and written materials on patient's individual exercise prescription       Expected Outcomes Short Term: Able to explain program exercise prescription;Long Term: Able to explain home exercise prescription to exercise independently                Exercise Goals Re-Evaluation :  Exercise Goals Re-Evaluation     Row Name 08/06/23 1127 08/17/23 1026 09/17/23 1130 09/19/23 1110 10/15/23 1044     Exercise Goal Re-Evaluation   Exercise Goals Review Increase Physical Activity;Increase Strength and Stamina;Able to understand and use rate of perceived exertion (RPE) scale Increase Physical Activity;Increase Strength and Stamina;Able to understand and use rate of perceived exertion (RPE) scale;Knowledge and understanding of Target Heart Rate Range (THRR);Able to check pulse independently;Understanding of Exercise Prescription Increase Physical Activity;Increase Strength and Stamina;Able to understand and use rate of perceived exertion (RPE) scale;Knowledge and understanding of Target Heart Rate Range (THRR);Able to check pulse independently;Understanding of Exercise Prescription Increase Physical Activity;Increase Strength and Stamina;Able to understand and use rate of perceived exertion (RPE)  scale;Knowledge and understanding of Target Heart Rate Range (THRR);Able to check pulse independently;Understanding of Exercise Prescription Increase Physical Activity;Increase Strength and Stamina;Able to understand and use rate of perceived exertion (RPE) scale;Knowledge and understanding of Target Heart Rate Range (THRR);Able to check pulse independently;Understanding of Exercise Prescription   Comments Terry Kerr was able to understand and use RPE scale appropriately. Reviewed exercise prescription with Terry Kerr. He is walking on the days he doesn't attend cardiac rehab and using 12 lb hand weights. Increased hand weights at cardiac rehab to 8 lbs and encouraged him to do weights on non-consecutive days. He is able to count his pulse. Terry Kerr continues to make great progress with exericse. Increasing workloads appropriately. Terry Kerr is walking 1.5-2 miles and using light weights at home. He was able to resume playing pickleball. He increased his speed an incline on the treadmill today and his level on the recumbent elliptical machine. Terry Kerr continues to progress well with exercise at cardiac rehab. He is walking at home consistently.   Expected Outcomes Progress workloads as tolerated to help increase strength and stamina. Terry Kerr will continue daily exercise routine to help improve cardiorespiratory fitness. Continue to progress workloads as tolerated. Continue walking and recreational activites in addition to exercise at cardiac rehab. Continue current exercise routine.            Discharge Exercise Prescription (Final Exercise Prescription Changes):  Exercise Prescription Changes - 10/15/23 1021       Response to Exercise   Blood Pressure (Admit) 128/60    Blood Pressure (Exit) 102/60    Heart Rate (Admit) 55 bpm    Heart Rate (Exercise) 106 bpm    Heart Rate (Exit) 64 bpm    Rating  of Perceived Exertion (Exercise) 12    Symptoms None    Comments Reviewed goals with Terry Kerr.    Duration Continue with 30 min of  aerobic exercise without signs/symptoms of physical distress.    Intensity THRR unchanged      Progression   Progression Continue to progress workloads to maintain intensity without signs/symptoms of physical distress.    Average METs 5.9      Resistance Training   Training Prescription Yes    Weight 8 lbs    Reps 10-15    Time 10 Minutes      Interval Training   Interval Training No      Treadmill   MPH 3.5    Grade 4.5    Minutes 15    METs 5.85      Recumbant Elliptical   Level 5    RPM 67    Watts 114    Minutes 15    METs 6      Home Exercise Plan   Plans to continue exercise at Home (comment)   Walking   Frequency Add 4 additional days to program exercise sessions.    Initial Home Exercises Provided 08/17/23             Nutrition:  Target Goals: Understanding of nutrition guidelines, daily intake of sodium 1500mg , cholesterol 200mg , calories 30% from fat and 7% or less from saturated fats, daily to have 5 or more servings of fruits and vegetables.  Biometrics:  Pre Biometrics - 07/30/23 1123       Pre Biometrics   Waist Circumference 40 inches    Hip Circumference 39 inches    Waist to Hip Ratio 1.03 %    Triceps Skinfold 12 mm    % Body Fat 26.4 %    Grip Strength 40 kg    Flexibility 0 in   could not reach   Single Leg Stand 23 seconds              Nutrition Therapy Plan and Nutrition Goals:  Nutrition Therapy & Goals - 10/05/23 0941       Nutrition Therapy   Diet Heart Healthy Diet    Drug/Food Interactions Statins/Certain Fruits      Personal Nutrition Goals   Nutrition Goal Patient to identify strategies for reducing cardiovascular risk by attending the [employer] education and nutrition series weekly.   goal not met.   Personal Goal #2 Patient to improve diet quality by using the plate method as a guide for meal planning to include lean protein/plant protein, fruits, vegetables, whole grains, nonfat dairy as part of a well-balanced  diet.   goal in progress.   Comments Goals in progress. Terry Kerr has medical history of STEMI, hyperlipidemia, diverticulosis. LDL has improved but not at goal of <55; lipid goals reviewed at PCP follow-up on 09/19/23 and recommended follow-up with lipid clinic to discuss zetia, PCSK9-i. Terry Kerr reports that he enjoys cooking.  He attend the [employer] education/nutrition series ~1/3x per week. He is down 2# since starting with our program.  Patient will benefit from participation in intensive cardiac rehab for nutrition, exercise, and lifestyle modification.      Intervention Plan   Intervention Prescribe, educate and counsel regarding individualized specific dietary modifications aiming towards targeted core components such as weight, hypertension, lipid management, diabetes, heart failure and other comorbidities.;Nutrition handout(s) given to patient.    Expected Outcomes Short Term Goal: Understand basic principles of dietary content, such as calories, fat, sodium, cholesterol and  nutrients.;Long Term Goal: Adherence to prescribed nutrition plan.             Nutrition Assessments:  MEDIFICTS Score Key: >=70 Need to make dietary changes  40-70 Heart Healthy Diet <= 40 Therapeutic Level Cholesterol Diet    Picture Your Plate Scores: <16 Unhealthy dietary pattern with much room for improvement. 41-50 Dietary pattern unlikely to meet recommendations for good health and room for improvement. 51-60 More healthful dietary pattern, with some room for improvement.  >60 Healthy dietary pattern, although there may be some specific behaviors that could be improved.    Nutrition Goals Re-Evaluation:  Nutrition Goals Re-Evaluation     Row Name 08/06/23 1127 09/06/23 1306 10/05/23 0941         Goals   Current Weight 178 lb 5.6 oz (80.9 kg) 174 lb 2.6 oz (79 kg) 176 lb 5.9 oz (80 kg)     Comment cholesterol 289, triglycerides 172, LDL 206, Lpa WNL, A1c WNL no new labs; most recent labs  cholesterol 289,  triglycerides 172, LDL 206, Lpa WNL, A1c WNL HDL 39, LDL 76 (goal <55), A1c WNL     Expected Outcome Terry Kerr has medical history of STEMI, hyperlipidemia, diverticulosis. Lipids are not at goal; LDL 206. Terry Kerr reports that he enjoys cooking. Patient will benefit from participation in intensive cardiac rehab for nutrition, exercise, and lifestyle modification. Terry Kerr has medical history of STEMI, hyperlipidemia, diverticulosis. No new labs at this time; Lipids are not at goal- LDL 206. Terry Kerr reports that he enjoys cooking. He does not consistently attend the [employer] education/nutrition series. He is down 4.2# since starting with our program. Patient will benefit from participation in intensive cardiac rehab for nutrition, exercise, and lifestyle modification. Goals in progress. Terry Kerr has medical history of STEMI, hyperlipidemia, diverticulosis. LDL has improved but not at goal of <55; lipid goals reviewed at PCP follow-up on 09/19/23 and recommended follow-up with lipid clinic to discuss zetia, PCSK9-i. Terry Kerr reports that he enjoys cooking. He attend the [employer] education/nutrition series ~1/3x per week. He is down 2# since starting with our program. Patient will benefit from participation in intensive cardiac rehab for nutrition, exercise, and lifestyle modification.              Nutrition Goals Re-Evaluation:  Nutrition Goals Re-Evaluation     Row Name 08/06/23 1127 09/06/23 1306 10/05/23 0941         Goals   Current Weight 178 lb 5.6 oz (80.9 kg) 174 lb 2.6 oz (79 kg) 176 lb 5.9 oz (80 kg)     Comment cholesterol 289, triglycerides 172, LDL 206, Lpa WNL, A1c WNL no new labs; most recent labs  cholesterol 289, triglycerides 172, LDL 206, Lpa WNL, A1c WNL HDL 39, LDL 76 (goal <55), A1c WNL     Expected Outcome Terry Kerr has medical history of STEMI, hyperlipidemia, diverticulosis. Lipids are not at goal; LDL 206. Terry Kerr reports that he enjoys cooking. Patient will benefit from participation in intensive cardiac rehab  for nutrition, exercise, and lifestyle modification. Terry Kerr has medical history of STEMI, hyperlipidemia, diverticulosis. No new labs at this time; Lipids are not at goal- LDL 206. Terry Kerr reports that he enjoys cooking. He does not consistently attend the [employer] education/nutrition series. He is down 4.2# since starting with our program. Patient will benefit from participation in intensive cardiac rehab for nutrition, exercise, and lifestyle modification. Goals in progress. Terry Kerr has medical history of STEMI, hyperlipidemia, diverticulosis. LDL has improved but not at goal of <55; lipid goals  reviewed at PCP follow-up on 09/19/23 and recommended follow-up with lipid clinic to discuss zetia, PCSK9-i. Terry Kerr reports that he enjoys cooking. He attend the [employer] education/nutrition series ~1/3x per week. He is down 2# since starting with our program. Patient will benefit from participation in intensive cardiac rehab for nutrition, exercise, and lifestyle modification.              Nutrition Goals Discharge (Final Nutrition Goals Re-Evaluation):  Nutrition Goals Re-Evaluation - 10/05/23 0941       Goals   Current Weight 176 lb 5.9 oz (80 kg)    Comment HDL 39, LDL 76 (goal <55), A1c WNL    Expected Outcome Goals in progress. Terry Kerr has medical history of STEMI, hyperlipidemia, diverticulosis. LDL has improved but not at goal of <55; lipid goals reviewed at PCP follow-up on 09/19/23 and recommended follow-up with lipid clinic to discuss zetia, PCSK9-i. Terry Kerr reports that he enjoys cooking. He attend the [employer] education/nutrition series ~1/3x per week. He is down 2# since starting with our program. Patient will benefit from participation in intensive cardiac rehab for nutrition, exercise, and lifestyle modification.             Psychosocial: Target Goals: Acknowledge presence or absence of significant depression and/or stress, maximize coping skills, provide positive support system. Participant is able to  verbalize types and ability to use techniques and skills needed for reducing stress and depression.  Initial Review & Psychosocial Screening:  Initial Psych Review & Screening - 07/30/23 1126       Initial Review   Current issues with None Identified      Family Dynamics   Good Support System? Yes   Wife for support     Barriers   Psychosocial barriers to participate in program There are no identifiable barriers or psychosocial needs.      Screening Interventions   Interventions Encouraged to exercise;Provide feedback about the scores to participant    Expected Outcomes Short Term goal: Identification and review with participant of any Quality of Life or Depression concerns found by scoring the questionnaire.;Long Term goal: The participant improves quality of Life and PHQ9 Scores as seen by post scores and/or verbalization of changes             Quality of Life Scores:  Quality of Life - 07/30/23 1226       Quality of Life   Select Quality of Life      Quality of Life Scores   Health/Function Pre 26.8 %    Socioeconomic Pre 29.64 %    Psych/Spiritual Pre 28.93 %    Family Pre 30 %    GLOBAL Pre 28.29 %            Scores of 19 and below usually indicate a poorer quality of life in these areas.  A difference of  2-3 points is a clinically meaningful difference.  A difference of 2-3 points in the total score of the Quality of Life Index has been associated with significant improvement in overall quality of life, self-image, physical symptoms, and general health in studies assessing change in quality of life.  PHQ-9: Review Flowsheet       07/30/2023  Depression screen PHQ 2/9  Decreased Interest 0  Down, Depressed, Hopeless 0  PHQ - 2 Score 0  Altered sleeping 0  Tired, decreased energy 0  Change in appetite 0  Feeling bad or failure about yourself  0  Trouble concentrating 0  Moving slowly or  fidgety/restless 0  Suicidal thoughts 0  PHQ-9 Score 0    Interpretation of Total Score  Total Score Depression Severity:  1-4 = Minimal depression, 5-9 = Mild depression, 10-14 = Moderate depression, 15-19 = Moderately severe depression, 20-27 = Severe depression   Psychosocial Evaluation and Intervention:   Psychosocial Re-Evaluation:  Psychosocial Re-Evaluation     Row Name 08/07/23 0802 08/28/23 1610 09/14/23 1029 10/16/23 1027       Psychosocial Re-Evaluation   Current issues with None Identified None Identified None Identified None Identified    Interventions Encouraged to attend Cardiac Rehabilitation for the exercise Encouraged to attend Cardiac Rehabilitation for the exercise Encouraged to attend Cardiac Rehabilitation for the exercise Encouraged to attend Cardiac Rehabilitation for the exercise    Continue Psychosocial Services  No Follow up required No Follow up required No Follow up required No Follow up required             Psychosocial Discharge (Final Psychosocial Re-Evaluation):  Psychosocial Re-Evaluation - 10/16/23 1027       Psychosocial Re-Evaluation   Current issues with None Identified    Interventions Encouraged to attend Cardiac Rehabilitation for the exercise    Continue Psychosocial Services  No Follow up required             Vocational Rehabilitation: Provide vocational rehab assistance to qualifying candidates.   Vocational Rehab Evaluation & Intervention:  Vocational Rehab - 07/30/23 1127       Initial Vocational Rehab Evaluation & Intervention   Assessment shows need for Vocational Rehabilitation No   Terry Kerr is retired            Education: Education Goals: Education classes will be provided on a weekly basis, covering required topics. Participant will state understanding/return demonstration of topics presented.    Education     Row Name 08/06/23 1300     Education   Cardiac Education Topics Pritikin   Geographical information systems officer  Exercise   Exercise Workshop Exercise Basics: Building Your Action Plan   Instruction Review Code 1- Verbalizes Understanding   Class Start Time 1146   Class Stop Time 1235   Class Time Calculation (min) 49 min    Row Name 08/08/23 1600     Education   Cardiac Education Topics Pritikin   Customer service manager   Weekly Topic Efficiency Cooking - Meals in a Snap   Instruction Review Code 1- Verbalizes Understanding   Class Start Time 1145   Class Stop Time 1225   Class Time Calculation (min) 40 min    Row Name 08/13/23 1500     Education   Cardiac Education Topics Pritikin   Glass blower/designer Nutrition   Nutrition Workshop Targeting Your Nutrition Priorities   Instruction Review Code 1- Verbalizes Understanding   Class Start Time 1150   Class Stop Time 1222   Class Time Calculation (min) 32 min    Row Name 08/15/23 1500     Education   Cardiac Education Topics Pritikin   Orthoptist   Educator Dietitian   Weekly Topic One-Pot Wonders   Instruction Review Code 1- Verbalizes Understanding   Class Start Time 1145   Class Stop Time 1227   Class Time Calculation (min) 42 min    Row  Name 08/17/23 1100     Education   Cardiac Education Topics --   Select --     Core Videos   Educator --   Select --   General Education --   Instruction Review Code --    Row Name 08/20/23 1300     Education   Cardiac Education Topics Pritikin   Select Workshops     Workshops   Educator Exercise Physiologist   Select Psychosocial   Psychosocial Workshop Focused Goals, Sustainable Changes   Instruction Review Code 1- Verbalizes Understanding   Class Start Time 1144   Class Stop Time 1218   Class Time Calculation (min) 34 min    Row Name 08/22/23 1300     Education   Cardiac Education Topics Pritikin   Optometrist   Weekly Topic Comforting Weekend Breakfasts   Instruction Review Code 1- Verbalizes Understanding   Class Start Time 1145   Class Stop Time 1228   Class Time Calculation (min) 43 min    Row Name 08/29/23 1100     Education   Cardiac Education Topics Pritikin   Customer service manager   Weekly Topic Fast Evening Meals   Instruction Review Code 1- Verbalizes Understanding   Class Start Time 1145   Class Stop Time 1225   Class Time Calculation (min) 40 min    Row Name 09/05/23 1500     Education   Cardiac Education Topics Pritikin   Customer service manager   Weekly Topic International Cuisine- Spotlight on the [employer] Zones   Instruction Review Code 1- Verbalizes Understanding   Class Start Time 1145   Class Stop Time 1225   Class Time Calculation (min) 40 min            Core Videos: Exercise    Move It!  Clinical staff conducted group or individual video education with verbal and written material and guidebook.  Patient learns the recommended Pritikin exercise program. Exercise with the goal of living a long, healthy life. Some of the health benefits of exercise include controlled diabetes, healthier blood pressure levels, improved cholesterol levels, improved heart and lung capacity, improved sleep, and better body composition. Everyone should speak with their doctor before starting or changing an exercise routine.  Biomechanical Limitations Clinical staff conducted group or individual video education with verbal and written material and guidebook.  Patient learns how biomechanical limitations can impact exercise and how we can mitigate and possibly overcome limitations to have an impactful and balanced exercise routine.  Body Composition Clinical staff conducted group or individual video education with verbal and written material and guidebook.  Patient learns that body composition  (ratio of muscle mass to fat mass) is a key component to assessing overall fitness, rather than body weight alone. Increased fat mass, especially visceral belly fat, can put us  at increased risk for metabolic syndrome, type 2 diabetes, heart disease, and even death. It is recommended to combine diet and exercise (cardiovascular and resistance training) to improve your body composition. Seek guidance from your physician and exercise physiologist before implementing an exercise routine.  Exercise Action Plan Clinical staff conducted group or individual video education with verbal and written material and guidebook.  Patient learns the recommended strategies to achieve and enjoy long-term exercise adherence, including variety, self-motivation, self-efficacy, and positive decision  making. Benefits of exercise include fitness, good health, weight management, more energy, better sleep, less stress, and overall well-being.  Medical   Heart Disease Risk Reduction Clinical staff conducted group or individual video education with verbal and written material and guidebook.  Patient learns our heart is our most vital organ as it circulates oxygen, nutrients, white blood cells, and hormones throughout the entire body, and carries waste away. Data supports a plant-based eating plan like the Pritikin Program for its effectiveness in slowing progression of and reversing heart disease. The video provides a number of recommendations to address heart disease.   Metabolic Syndrome and Belly Fat  Clinical staff conducted group or individual video education with verbal and written material and guidebook.  Patient learns what metabolic syndrome is, how it leads to heart disease, and how one can reverse it and keep it from coming back. You have metabolic syndrome if you have 3 of the following 5 criteria: abdominal obesity, high blood pressure, high triglycerides, low HDL cholesterol, and high blood sugar.  Hypertension and  Heart Disease Clinical staff conducted group or individual video education with verbal and written material and guidebook.  Patient learns that high blood pressure, or hypertension, is very common in the [place] . Hypertension is largely due to excessive salt intake, but other important risk factors include being overweight, physical inactivity, drinking too much alcohol , smoking, and not eating enough potassium from fruits and vegetables. High blood pressure is a leading risk factor for heart attack, stroke, congestive heart failure, dementia, kidney failure, and premature death. Long-term effects of excessive salt intake include stiffening of the arteries and thickening of heart muscle and organ damage. Recommendations include ways to reduce hypertension and the risk of heart disease.  Diseases of Our Time - Focusing on Diabetes Clinical staff conducted group or individual video education with verbal and written material and guidebook.  Patient learns why the best way to stop diseases of our time is prevention, through food and other lifestyle changes. Medicine (such as prescription pills and surgeries) is often only a Band-Aid on the problem, not a long-term solution. Most common diseases of our time include obesity, type 2 diabetes, hypertension, heart disease, and cancer. The Pritikin Program is recommended and has been proven to help reduce, reverse, and/or prevent the damaging effects of metabolic syndrome.  Nutrition   Overview of the Pritikin Eating Plan  Clinical staff conducted group or individual video education with verbal and written material and guidebook.  Patient learns about the Pritikin Eating Plan for disease risk reduction. The Pritikin Eating Plan emphasizes a wide variety of unrefined, minimally-processed carbohydrates, like fruits, vegetables, whole grains, and legumes. Go, Caution, and Stop food choices are explained. Plant-based and lean animal proteins are emphasized.  Rationale provided for low sodium intake for blood pressure control, low added sugars for blood sugar stabilization, and low added fats and oils for coronary artery disease risk reduction and weight management.  Calorie Density  Clinical staff conducted group or individual video education with verbal and written material and guidebook.  Patient learns about calorie density and how it impacts the Pritikin Eating Plan. Knowing the characteristics of the food you choose will help you decide whether those foods will lead to weight gain or weight loss, and whether you want to consume more or less of them. Weight loss is usually a side effect of the Pritikin Eating Plan because of its focus on low calorie-dense foods.  Label Reading  Clinical staff conducted group or  individual video education with verbal and written material and guidebook.  Patient learns about the Pritikin recommended label reading guidelines and corresponding recommendations regarding calorie density, added sugars, sodium content, and whole grains.  Dining Out - Part 1  Clinical staff conducted group or individual video education with verbal and written material and guidebook.  Patient learns that restaurant meals can be sabotaging because they can be so high in calories, fat, sodium, and/or sugar. Patient learns recommended strategies on how to positively address this and avoid unhealthy pitfalls.  Facts on Fats  Clinical staff conducted group or individual video education with verbal and written material and guidebook.  Patient learns that lifestyle modifications can be just as effective, if not more so, as many medications for lowering your risk of heart disease. A Pritikin lifestyle can help to reduce your risk of inflammation and atherosclerosis (cholesterol build-up, or plaque, in the artery walls). Lifestyle interventions such as dietary choices and physical activity address the cause of atherosclerosis. A review of the types of  fats and their impact on blood cholesterol levels, along with dietary recommendations to reduce fat intake is also included.  Nutrition Action Plan  Clinical staff conducted group or individual video education with verbal and written material and guidebook.  Patient learns how to incorporate Pritikin recommendations into their lifestyle. Recommendations include planning and keeping personal health goals in mind as an important part of their success.  Healthy Mind-Set    Healthy Minds, Bodies, Hearts  Clinical staff conducted group or individual video education with verbal and written material and guidebook.  Patient learns how to identify when they are stressed. Video will discuss the impact of that stress, as well as the many benefits of stress management. Patient will also be introduced to stress management techniques. The way we think, act, and feel has an impact on our hearts.  How Our Thoughts Can Heal Our Hearts  Clinical staff conducted group or individual video education with verbal and written material and guidebook.  Patient learns that negative thoughts can cause depression and anxiety. This can result in negative lifestyle behavior and serious health problems. Cognitive behavioral therapy is an effective method to help control our thoughts in order to change and improve our emotional outlook.  Additional Videos:  Exercise    Improving Performance  Clinical staff conducted group or individual video education with verbal and written material and guidebook.  Patient learns to use a non-linear approach by alternating intensity levels and lengths of time spent exercising to help burn more calories and lose more body fat. Cardiovascular exercise helps improve heart health, metabolism, hormonal balance, blood sugar control, and recovery from fatigue. Resistance training improves strength, endurance, balance, coordination, reaction time, metabolism, and muscle mass. Flexibility exercise  improves circulation, posture, and balance. Seek guidance from your physician and exercise physiologist before implementing an exercise routine and learn your capabilities and proper form for all exercise.  Introduction to Yoga  Clinical staff conducted group or individual video education with verbal and written material and guidebook.  Patient learns about yoga, a discipline of the coming together of mind, breath, and body. The benefits of yoga include improved flexibility, improved range of motion, better posture and core strength, increased lung function, weight loss, and positive self-image. Yoga's heart health benefits include lowered blood pressure, healthier heart rate, decreased cholesterol and triglyceride levels, improved immune function, and reduced stress. Seek guidance from your physician and exercise physiologist before implementing an exercise routine and learn your capabilities and  proper form for all exercise.  Medical   Aging: Enhancing Your Quality of Life  Clinical staff conducted group or individual video education with verbal and written material and guidebook.  Patient learns key strategies and recommendations to stay in good physical health and enhance quality of life, such as prevention strategies, having an advocate, securing a Health Care Proxy and Power of Attorney, and keeping a list of medications and system for tracking them. It also discusses how to avoid risk for bone loss.  Biology of Weight Control  Clinical staff conducted group or individual video education with verbal and written material and guidebook.  Patient learns that weight gain occurs because we consume more calories than we burn (eating more, moving less). Even if your body weight is normal, you may have higher ratios of fat compared to muscle mass. Too much body fat puts you at increased risk for cardiovascular disease, heart attack, stroke, type 2 diabetes, and obesity-related cancers. In addition to  exercise, following the Pritikin Eating Plan can help reduce your risk.  Decoding Lab Results  Clinical staff conducted group or individual video education with verbal and written material and guidebook.  Patient learns that lab test reflects one measurement whose values change over time and are influenced by many factors, including medication, stress, sleep, exercise, food, hydration, pre-existing medical conditions, and more. It is recommended to use the knowledge from this video to become more involved with your lab results and evaluate your numbers to speak with your doctor.   Diseases of Our Time - Overview  Clinical staff conducted group or individual video education with verbal and written material and guidebook.  Patient learns that according to the CDC, 50% to 70% of chronic diseases (such as obesity, type 2 diabetes, elevated lipids, hypertension, and heart disease) are avoidable through lifestyle improvements including healthier food choices, listening to satiety cues, and increased physical activity.  Sleep Disorders Clinical staff conducted group or individual video education with verbal and written material and guidebook.  Patient learns how good quality and duration of sleep are important to overall health and well-being. Patient also learns about sleep disorders and how they impact health along with recommendations to address them, including discussing with a physician.  Nutrition  Dining Out - Part 2 Clinical staff conducted group or individual video education with verbal and written material and guidebook.  Patient learns how to plan ahead and communicate in order to maximize their dining experience in a healthy and nutritious manner. Included are recommended food choices based on the type of restaurant the patient is visiting.   Fueling a Banker conducted group or individual video education with verbal and written material and guidebook.  There is a  strong connection between our food choices and our health. Diseases like obesity and type 2 diabetes are very prevalent and are in large-part due to lifestyle choices. The Pritikin Eating Plan provides plenty of food and hunger-curbing satisfaction. It is easy to follow, affordable, and helps reduce health risks.  Menu Workshop  Clinical staff conducted group or individual video education with verbal and written material and guidebook.  Patient learns that restaurant meals can sabotage health goals because they are often packed with calories, fat, sodium, and sugar. Recommendations include strategies to plan ahead and to communicate with the manager, chef, or server to help order a healthier meal.  Planning Your Eating Strategy  Clinical staff conducted group or individual video education with verbal and written material and  guidebook.  Patient learns about the Pritikin Eating Plan and its benefit of reducing the risk of disease. The Pritikin Eating Plan does not focus on calories. Instead, it emphasizes high-quality, nutrient-rich foods. By knowing the characteristics of the foods, we choose, we can determine their calorie density and make informed decisions.  Targeting Your Nutrition Priorities  Clinical staff conducted group or individual video education with verbal and written material and guidebook.  Patient learns that lifestyle habits have a tremendous impact on disease risk and progression. This video provides eating and physical activity recommendations based on your personal health goals, such as reducing LDL cholesterol, losing weight, preventing or controlling type 2 diabetes, and reducing high blood pressure.  Vitamins and Minerals  Clinical staff conducted group or individual video education with verbal and written material and guidebook.  Patient learns different ways to obtain key vitamins and minerals, including through a recommended healthy diet. It is important to discuss all  supplements you take with your doctor.   Healthy Mind-Set    Smoking Cessation  Clinical staff conducted group or individual video education with verbal and written material and guidebook.  Patient learns that cigarette smoking and tobacco addiction pose a serious health risk which affects millions of people. Stopping smoking will significantly reduce the risk of heart disease, lung disease, and many forms of cancer. Recommended strategies for quitting are covered, including working with your doctor to develop a successful plan.  Culinary   Becoming a Set designer conducted group or individual video education with verbal and written material and guidebook.  Patient learns that cooking at home can be healthy, cost-effective, quick, and puts them in control. Keys to cooking healthy recipes will include looking at your recipe, assessing your equipment needs, planning ahead, making it simple, choosing cost-effective seasonal ingredients, and limiting the use of added fats, salts, and sugars.  Cooking - Breakfast and Snacks  Clinical staff conducted group or individual video education with verbal and written material and guidebook.  Patient learns how important breakfast is to satiety and nutrition through the entire day. Recommendations include key foods to eat during breakfast to help stabilize blood sugar levels and to prevent overeating at meals later in the day. Planning ahead is also a key component.  Cooking - Educational psychologist conducted group or individual video education with verbal and written material and guidebook.  Patient learns eating strategies to improve overall health, including an approach to cook more at home. Recommendations include thinking of animal protein as a side on your plate rather than center stage and focusing instead on lower calorie dense options like vegetables, fruits, whole grains, and plant-based proteins, such as beans. Making sauces  in large quantities to freeze for later and leaving the skin on your vegetables are also recommended to maximize your experience.  Cooking - Healthy Salads and Dressing Clinical staff conducted group or individual video education with verbal and written material and guidebook.  Patient learns that vegetables, fruits, whole grains, and legumes are the foundations of the Pritikin Eating Plan. Recommendations include how to incorporate each of these in flavorful and healthy salads, and how to create homemade salad dressings. Proper handling of ingredients is also covered. Cooking - Soups and [employer] - Soups and Desserts Clinical staff conducted group or individual video education with verbal and written material and guidebook.  Patient learns that Pritikin soups and desserts make for easy, nutritious, and delicious snacks and meal components that  are low in sodium, fat, sugar, and calorie density, while high in vitamins, minerals, and filling fiber. Recommendations include simple and healthy ideas for soups and desserts.   Overview     The Pritikin Solution Program Overview Clinical staff conducted group or individual video education with verbal and written material and guidebook.  Patient learns that the results of the Pritikin Program have been documented in more than 100 articles published in peer-reviewed journals, and the benefits include reducing risk factors for (and, in some cases, even reversing) high cholesterol, high blood pressure, type 2 diabetes, obesity, and more! An overview of the three key pillars of the Pritikin Program will be covered: eating well, doing regular exercise, and having a healthy mind-set.  WORKSHOPS  Exercise: Exercise Basics: Building Your Action Plan Clinical staff led group instruction and group discussion with PowerPoint presentation and patient guidebook. To enhance the learning environment the use of posters, models and videos may be added. At the  conclusion of this workshop, patients will comprehend the difference between physical activity and exercise, as well as the benefits of incorporating both, into their routine. Patients will understand the FITT (Frequency, Intensity, Time, and Type) principle and how to use it to build an exercise action plan. In addition, safety concerns and other considerations for exercise and cardiac rehab will be addressed by the presenter. The purpose of this lesson is to promote a comprehensive and effective weekly exercise routine in order to improve patients' overall level of fitness.   Managing Heart Disease: Your Path to a Healthier Heart Clinical staff led group instruction and group discussion with PowerPoint presentation and patient guidebook. To enhance the learning environment the use of posters, models and videos may be added.At the conclusion of this workshop, patients will understand the anatomy and physiology of the heart. Additionally, they will understand how Pritikin's three pillars impact the risk factors, the progression, and the management of heart disease.  The purpose of this lesson is to provide a high-level overview of the heart, heart disease, and how the Pritikin lifestyle positively impacts risk factors.  Exercise Biomechanics Clinical staff led group instruction and group discussion with PowerPoint presentation and patient guidebook. To enhance the learning environment the use of posters, models and videos may be added. Patients will learn how the structural parts of their bodies function and how these functions impact their daily activities, movement, and exercise. Patients will learn how to promote a neutral spine, learn how to manage pain, and identify ways to improve their physical movement in order to promote healthy living. The purpose of this lesson is to expose patients to common physical limitations that impact physical activity. Participants will learn practical ways  to adapt and manage aches and pains, and to minimize their effect on regular exercise. Patients will learn how to maintain good posture while sitting, walking, and lifting.  Balance Training and Fall Prevention  Clinical staff led group instruction and group discussion with PowerPoint presentation and patient guidebook. To enhance the learning environment the use of posters, models and videos may be added. At the conclusion of this workshop, patients will understand the importance of their sensorimotor skills (vision, proprioception, and the vestibular system) in maintaining their ability to balance as they age. Patients will apply a variety of balancing exercises that are appropriate for their current level of function. Patients will understand the common causes for poor balance, possible solutions to these problems, and ways to modify their physical environment in order to minimize their fall  risk. The purpose of this lesson is to teach patients about the importance of maintaining balance as they age and ways to minimize their risk of falling.  WORKSHOPS   Nutrition:  Fueling a Ship broker led group instruction and group discussion with PowerPoint presentation and patient guidebook. To enhance the learning environment the use of posters, models and videos may be added. Patients will review the foundational principles of the Pritikin Eating Plan and understand what constitutes a serving size in each of the food groups. Patients will also learn Pritikin-friendly foods that are better choices when away from home and review make-ahead meal and snack options. Calorie density will be reviewed and applied to three nutrition priorities: weight maintenance, weight loss, and weight gain. The purpose of this lesson is to reinforce (in a group setting) the key concepts around what patients are recommended to eat and how to apply these guidelines when away from home by planning and selecting  Pritikin-friendly options. Patients will understand how calorie density may be adjusted for different weight management goals.  Mindful Eating  Clinical staff led group instruction and group discussion with PowerPoint presentation and patient guidebook. To enhance the learning environment the use of posters, models and videos may be added. Patients will briefly review the concepts of the Pritikin Eating Plan and the importance of low-calorie dense foods. The concept of mindful eating will be introduced as well as the importance of paying attention to internal hunger signals. Triggers for non-hunger eating and techniques for dealing with triggers will be explored. The purpose of this lesson is to provide patients with the opportunity to review the basic principles of the Pritikin Eating Plan, discuss the value of eating mindfully and how to measure internal cues of hunger and fullness using the Hunger Scale. Patients will also discuss reasons for non-hunger eating and learn strategies to use for controlling emotional eating.  Targeting Your Nutrition Priorities Clinical staff led group instruction and group discussion with PowerPoint presentation and patient guidebook. To enhance the learning environment the use of posters, models and videos may be added. Patients will learn how to determine their genetic susceptibility to disease by reviewing their family history. Patients will gain insight into the importance of diet as part of an overall healthy lifestyle in mitigating the impact of genetics and other environmental insults. The purpose of this lesson is to provide patients with the opportunity to assess their personal nutrition priorities by looking at their family history, their own health history and current risk factors. Patients will also be able to discuss ways of prioritizing and modifying the Pritikin Eating Plan for their highest risk areas  Menu  Clinical staff led group instruction and group  discussion with PowerPoint presentation and patient guidebook. To enhance the learning environment the use of posters, models and videos may be added. Using menus brought in from [employer], or printed from [employer], patients will apply the Pritikin dining out guidelines that were presented in the [employer] video. Patients will also be able to practice these guidelines in a variety of provided scenarios. The purpose of this lesson is to provide patients with the opportunity to practice hands-on learning of the Pritikin Dining Out guidelines with actual menus and practice scenarios.  Label Reading Clinical staff led group instruction and group discussion with PowerPoint presentation and patient guidebook. To enhance the learning environment the use of posters, models and videos may be added. Patients will review and discuss the Pritikin label  reading guidelines presented in Pritikin's Label Reading Educational series video. Using fool labels brought in from local grocery stores and markets, patients will apply the label reading guidelines and determine if the packaged food meet the Pritikin guidelines. The purpose of this lesson is to provide patients with the opportunity to review, discuss, and practice hands-on learning of the Pritikin Label Reading guidelines with actual packaged food labels. Cooking School  Pritikin's [employer] are designed to teach patients ways to prepare quick, simple, and affordable recipes at home. The importance of nutrition's role in chronic disease risk reduction is reflected in its emphasis in the overall Pritikin program. By learning how to prepare essential core Pritikin Eating Plan recipes, patients will increase control over what they eat; be able to customize the flavor of foods without the use of added salt, sugar, or fat; and improve the quality of the food they consume. By learning a set of core recipes which are easily  assembled, quickly prepared, and affordable, patients are more likely to prepare more healthy foods at home. These workshops focus on convenient breakfasts, simple entres, side dishes, and desserts which can be prepared with minimal effort and are consistent with nutrition recommendations for cardiovascular risk reduction. Cooking [employer] are taught by a Armed forces logistics/support/administrative officer (RD) who has been trained by the [employer]. The chef or RD has a clear understanding of the importance of minimizing - if not completely eliminating - added fat, sugar, and sodium in recipes. Throughout the series of Cooking School Workshop sessions, patients will learn about healthy ingredients and efficient methods of cooking to build confidence in their capability to prepare    Cooking School weekly topics:  Adding Flavor- Sodium-Free  Fast and Healthy Breakfasts  Powerhouse Plant-Based Proteins  Satisfying Salads and Dressings  Simple Sides and Sauces  International Cuisine-Spotlight on the [employer] Zones  Delicious Desserts  Savory Soups  [employer] - Meals in a Astronomer Appetizers and Snacks  Comforting Weekend Breakfasts  One-Pot Wonders   Fast Evening Meals  Landscape architect Your Pritikin Plate  WORKSHOPS   Healthy Mindset (Psychosocial):  Focused Goals, Sustainable Changes Clinical staff led group instruction and group discussion with PowerPoint presentation and patient guidebook. To enhance the learning environment the use of posters, models and videos may be added. Patients will be able to apply effective goal setting strategies to establish at least one personal goal, and then take consistent, meaningful action toward that goal. They will learn to identify common barriers to achieving personal goals and develop strategies to overcome them. Patients will also gain an understanding of how our mind-set can impact our ability to achieve goals and the  importance of cultivating a positive and growth-oriented mind-set. The purpose of this lesson is to provide patients with a deeper understanding of how to set and achieve personal goals, as well as the tools and strategies needed to overcome common obstacles which may arise along the way.  From Head to Heart: The Power of a Healthy Outlook  Clinical staff led group instruction and group discussion with PowerPoint presentation and patient guidebook. To enhance the learning environment the use of posters, models and videos may be added. Patients will be able to recognize and describe the impact of emotions and mood on physical health. They will discover the importance of self-care and explore self-care practices which may work for them. Patients will also learn how to utilize the 4 C's to cultivate a  healthier outlook and better manage stress and challenges. The purpose of this lesson is to demonstrate to patients how a healthy outlook is an essential part of maintaining good health, especially as they continue their cardiac rehab journey.  Healthy Sleep for a Healthy Heart Clinical staff led group instruction and group discussion with PowerPoint presentation and patient guidebook. To enhance the learning environment the use of posters, models and videos may be added. At the conclusion of this workshop, patients will be able to demonstrate knowledge of the importance of sleep to overall health, well-being, and quality of life. They will understand the symptoms of, and treatments for, common sleep disorders. Patients will also be able to identify daytime and nighttime behaviors which impact sleep, and they will be able to apply these tools to help manage sleep-related challenges. The purpose of this lesson is to provide patients with a general overview of sleep and outline the importance of quality sleep. Patients will learn about a few of the most common sleep disorders. Patients will also be introduced to the  concept of "sleep hygiene," and discover ways to self-manage certain sleeping problems through simple daily behavior changes. Finally, the workshop will motivate patients by clarifying the links between quality sleep and their goals of heart-healthy living.   Recognizing and Reducing Stress Clinical staff led group instruction and group discussion with PowerPoint presentation and patient guidebook. To enhance the learning environment the use of posters, models and videos may be added. At the conclusion of this workshop, patients will be able to understand the types of stress reactions, differentiate between acute and chronic stress, and recognize the impact that chronic stress has on their health. They will also be able to apply different coping mechanisms, such as reframing negative self-talk. Patients will have the opportunity to practice a variety of stress management techniques, such as deep abdominal breathing, progressive muscle relaxation, and/or guided imagery.  The purpose of this lesson is to educate patients on the role of stress in their lives and to provide healthy techniques for coping with it.  Learning Barriers/Preferences:  Learning Barriers/Preferences - 07/30/23 1127       Learning Barriers/Preferences   Learning Barriers Sight   wears glasses   Learning Preferences Audio;Computer/Internet;Group Instruction;Individual Instruction;Pictoral;Skilled Demonstration;Verbal Instruction;Written Material;Video             Education Topics:  Knowledge Questionnaire Score:  Knowledge Questionnaire Score - 07/30/23 1134       Knowledge Questionnaire Score   Pre Score 23/24             Core Components/Risk Factors/Patient Goals at Admission:  Personal Goals and Risk Factors at Admission - 07/30/23 1128       Core Components/Risk Factors/Patient Goals on Admission    Weight Management Yes    Intervention Weight Management: Develop a combined nutrition and exercise program  designed to reach desired caloric intake, while maintaining appropriate intake of nutrient and fiber, sodium and fats, and appropriate energy expenditure required for the weight goal.;Weight Management: Provide education and appropriate resources to help participant work on and attain dietary goals.    Expected Outcomes Short Term: Continue to assess and modify interventions until short term weight is achieved;Long Term: Adherence to nutrition and physical activity/exercise program aimed toward attainment of established weight goal;Understanding recommendations for meals to include 15-35% energy as protein, 25-35% energy from fat, 35-60% energy from carbohydrates, less than 200mg  of dietary cholesterol, 20-35 gm of total fiber daily;Understanding of distribution of calorie intake throughout the  day with the consumption of 4-5 meals/snacks    Hypertension Yes    Intervention Provide education on lifestyle modifcations including regular physical activity/exercise, weight management, moderate sodium restriction and increased consumption of fresh fruit, vegetables, and low fat dairy, alcohol  moderation, and smoking cessation.;Monitor prescription use compliance.    Expected Outcomes Short Term: Continued assessment and intervention until BP is < 140/58mm HG in hypertensive participants. < 130/85mm HG in hypertensive participants with diabetes, heart failure or chronic kidney disease.;Long Term: Maintenance of blood pressure at goal levels.    Lipids Yes    Intervention Provide education and support for participant on nutrition & aerobic/resistive exercise along with prescribed medications to achieve LDL 70mg , HDL >40mg .    Expected Outcomes Short Term: Participant states understanding of desired cholesterol values and is compliant with medications prescribed. Participant is following exercise prescription and nutrition guidelines.;Long Term: Cholesterol controlled with medications as prescribed, with  individualized exercise RX and with personalized nutrition plan. Value goals: LDL < 70mg , HDL > 40 mg.             Core Components/Risk Factors/Patient Goals Review:   Goals and Risk Factor Review     Row Name 08/07/23 0803 08/28/23 0823 09/14/23 1030 10/16/23 1028       Core Components/Risk Factors/Patient Goals Review   Personal Goals Review Weight Management/Obesity;Hypertension;Lipids Weight Management/Obesity;Hypertension;Lipids Weight Management/Obesity;Hypertension;Lipids Weight Management/Obesity;Hypertension;Lipids    Review Terry Kerr started cardiac rehab on 08/06/23. Terry Kerr did well with exercise. Vital signs were stable. Terry Kerr is doing  well with exercise at cardiac rehab. Vital signs have been  stable. Terry Kerr continues to do well with exercise at cardiac rehab. Vital signs remain stable. Terry Kerr has been increasing his met levels. Terry Kerr continues to do well with exercise at cardiac rehab. Vital signs remain stable. Terry Kerr has been increasing his met levels. Terry Kerr will complete cardiac rehab at the end of the month.    Expected Outcomes Terry Kerr will continue to participate in cardiac rehab for exercise, nutrition and lifesytle modifications Terry Kerr will continue to participate in cardiac rehab for exercise, nutrition and lifesytle modifications Terry Kerr will continue to participate in cardiac rehab for exercise, nutrition and lifesytle modifications Terry Kerr will continue to participate in cardiac rehab for exercise, nutrition and lifesytle modifications             Core Components/Risk Factors/Patient Goals at Discharge (Final Review):   Goals and Risk Factor Review - 10/16/23 1028       Core Components/Risk Factors/Patient Goals Review   Personal Goals Review Weight Management/Obesity;Hypertension;Lipids    Review Terry Kerr continues to do well with exercise at cardiac rehab. Vital signs remain stable. Terry Kerr has been increasing his met levels. Terry Kerr will complete cardiac rehab at the end of the month.    Expected Outcomes  Terry Kerr will continue to participate in cardiac rehab for exercise, nutrition and lifesytle modifications             ITP Comments:  ITP Comments     Row Name 07/30/23 1124 08/07/23 0759 08/28/23 0822 09/14/23 1029 10/16/23 1026   ITP Comments Dr. Gaylyn Keas medical director. Introduction to pritikin education/intensive cardiac rehab. Initial orientation packet reviewed with patient. 30 Day ITP Review. Terry Kerr started cardiac rehab on 08/07/23. Terry Kerr did well with exercise. 30 Day ITP Review. Terry Kerr has good attendance and particpation with exercise at cardiac rehab 30 Day ITP Review. Terry Kerr has good attendance and particpation with exercise at cardiac rehab 30 Day ITP Review. Terry Kerr has good  particpation with exercise  at cardiac rehab when in attendance. Terry Kerr recently returned after being out of town for vacation            Comments: See ITP Comments

## 2023-10-19 ENCOUNTER — Encounter (HOSPITAL_COMMUNITY)
Admission: RE | Admit: 2023-10-19 | Discharge: 2023-10-19 | Disposition: A | Discharge status: Home / Self Care | Payer: Medicare Other | Source: Ambulatory Visit | Attending: Cardiology | Admitting: Cardiology

## 2023-10-19 VITALS — BP 122/64 | HR 50 | Ht 68.0 in | Wt 176.4 lb

## 2023-10-19 DIAGNOSIS — I2102 ST elevation (STEMI) myocardial infarction involving left anterior descending coronary artery: Secondary | ICD-10-CM

## 2023-10-19 DIAGNOSIS — Z955 Presence of coronary angioplasty implant and graft: Secondary | ICD-10-CM

## 2023-10-19 NOTE — Progress Notes (Signed)
 Discharge Progress Report  Patient Details  Name: Terry Kerr MRN: [id number] Date of Birth: 14-Aug-1955 Referring Provider:   Flowsheet Row INTENSIVE CARDIAC REHAB ORIENT from 07/30/2023 in [facility] [address] [facility]  Referring Provider Eilleen Grates, MD        Number of Visits: 43  Reason for Discharge:  Patient reached a stable level of exercise. Patient independent in their exercise. Patient has met program and personal goals.  Smoking History:  Social History   Tobacco Use  Smoking Status Never  Smokeless Tobacco Never    Diagnosis:  07/16/23 STEMI  07/16/23 DES LAD  ADL UCSD:   Initial Exercise Prescription:  Initial Exercise Prescription - 07/30/23 1200       Date of Initial Exercise RX and Referring Provider   Date 07/30/23    Referring Provider Eilleen Grates, MD    Expected Discharge Date 10/24/23      Treadmill   MPH 3.2    Grade 0    Minutes 15    METs 3.5      Recumbant Elliptical   Level 2    RPM 60    Watts 80    Minutes 15    METs 3.5      Prescription Details   Frequency (times per week) 3    Duration Progress to 30 minutes of continuous aerobic without signs/symptoms of physical distress      Intensity   THRR 40-80% of Max Heartrate 61-122    Ratings of Perceived Exertion 11-13    Perceived Dyspnea 0-4      Progression   Progression Continue progressive overload as per policy without signs/symptoms or physical distress.      Resistance Training   Training Prescription Yes    Weight 4    Reps 10-15             Discharge Exercise Prescription (Final Exercise Prescription Changes):  Exercise Prescription Changes - 10/19/23 1031       Response to Exercise   Blood Pressure (Admit) 122/64    Blood Pressure (Exit) 126/62    Heart Rate (Admit) 50 bpm    Heart Rate (Exercise) 110 bpm    Heart Rate (Exit) 58 bpm    Rating of Perceived Exertion (Exercise) 12    Symptoms None     Comments [name] completed the cardiac rehab program today.    Duration Continue with 30 min of aerobic exercise without signs/symptoms of physical distress.    Intensity THRR unchanged      Progression   Progression Continue to progress workloads to maintain intensity without signs/symptoms of physical distress.    Average METs 6.1      Resistance Training   Training Prescription Yes    Weight 8 lbs    Reps 10-15    Time 10 Minutes      Interval Training   Interval Training No      Treadmill   MPH 3.6    Grade 5.5    Minutes 15    METs 6.49      Recumbant Elliptical   Level 5    RPM 75    Watts 128    Minutes 15    METs 5.8      Home Exercise Plan   Plans to continue exercise at Home (comment)   Walking   Frequency Add 4 additional days to program exercise sessions.    Initial Home Exercises Provided 08/17/23  Functional Capacity:  6 Minute Walk     Row Name 07/30/23 1224 10/17/23 1044       6 Minute Walk   Phase Initial Discharge    Distance 1860 feet 2160 feet    Distance % Change -- 16.13 %    Distance Feet Change -- 300 ft    Walk Time 6 minutes 6 minutes    # of Rest Breaks 0 0    MPH 3.52 4.09    METS 4.03 4.61    RPE 9 12    Perceived Dyspnea  0 0    VO2 Peak 14.1 16.14    Symptoms No No    Resting HR 57 bpm 54 bpm    Resting BP 112/70 124/62    Resting Oxygen Saturation  99 % --    Exercise Oxygen Saturation  during 6 min walk 99 % --    Max Ex. HR 97 bpm 98 bpm    Max Ex. BP 128/68 142/50    2 Minute Post BP 118/66 100/60             Psychological, QOL, Others - Outcomes: PHQ 2/9:    10/19/2023    2:25 PM 07/30/2023   11:25 AM  Depression screen PHQ 2/9  Decreased Interest 0 0  Down, Depressed, Hopeless 0 0  PHQ - 2 Score 0 0  Altered sleeping 1 0  Tired, decreased energy 0 0  Change in appetite 0 0  Feeling bad or failure about yourself  0 0  Trouble concentrating 0 0  Moving slowly or fidgety/restless 0 0   Suicidal thoughts 0 0  PHQ-9 Score 1 0  Difficult doing work/chores Not difficult at all     Quality of Life:  Quality of Life - 10/23/23 0758       Quality of Life   Select Quality of Life      Quality of Life Scores   Health/Function Pre 26.8 %    Health/Function Post 28.8 %    Health/Function % Change 7.46 %    Socioeconomic Pre 29.64 %    Socioeconomic Post 30 %    Socioeconomic % Change  1.21 %    Psych/Spiritual Pre 28.93 %    Psych/Spiritual Post 30 %    Psych/Spiritual % Change 3.7 %    Family Pre 30 %    Family Post 30 %    Family % Change 0 %    GLOBAL Pre 28.29 %    GLOBAL Post 29.49 %    GLOBAL % Change 4.24 %             Personal Goals: Goals established at orientation with interventions provided to work toward goal.  Personal Goals and Risk Factors at Admission - 07/30/23 1128       Core Components/Risk Factors/Patient Goals on Admission    Weight Management Yes    Intervention Weight Management: Develop a combined nutrition and exercise program designed to reach desired caloric intake, while maintaining appropriate intake of nutrient and fiber, sodium and fats, and appropriate energy expenditure required for the weight goal.;Weight Management: Provide education and appropriate resources to help participant work on and attain dietary goals.    Expected Outcomes Short Term: Continue to assess and modify interventions until short term weight is achieved;Long Term: Adherence to nutrition and physical activity/exercise program aimed toward attainment of established weight goal;Understanding recommendations for meals to include 15-35% energy as protein, 25-35% energy from fat, 35-60% energy from  carbohydrates, less than 200mg  of dietary cholesterol, 20-35 gm of total fiber daily;Understanding of distribution of calorie intake throughout the day with the consumption of 4-5 meals/snacks    Hypertension Yes    Intervention Provide education on lifestyle  modifcations including regular physical activity/exercise, weight management, moderate sodium restriction and increased consumption of fresh fruit, vegetables, and low fat dairy, alcohol  moderation, and smoking cessation.;Monitor prescription use compliance.    Expected Outcomes Short Term: Continued assessment and intervention until BP is < 140/1mm HG in hypertensive participants. < 130/41mm HG in hypertensive participants with diabetes, heart failure or chronic kidney disease.;Long Term: Maintenance of blood pressure at goal levels.    Lipids Yes    Intervention Provide education and support for participant on nutrition & aerobic/resistive exercise along with prescribed medications to achieve LDL 70mg , HDL >40mg .    Expected Outcomes Short Term: Participant states understanding of desired cholesterol values and is compliant with medications prescribed. Participant is following exercise prescription and nutrition guidelines.;Long Term: Cholesterol controlled with medications as prescribed, with individualized exercise RX and with personalized nutrition plan. Value goals: LDL < 70mg , HDL > 40 mg.              Personal Goals Discharge:  Goals and Risk Factor Review     Row Name 08/07/23 0803 08/28/23 0823 09/14/23 1030 10/16/23 1028       Core Components/Risk Factors/Patient Goals Review   Personal Goals Review Weight Management/Obesity;Hypertension;Lipids Weight Management/Obesity;Hypertension;Lipids Weight Management/Obesity;Hypertension;Lipids Weight Management/Obesity;Hypertension;Lipids    Review [name] started cardiac rehab on 08/06/23. [name] did well with exercise. Vital signs were stable. [name] is doing  well with exercise at cardiac rehab. Vital signs have been  stable. [name] continues to do well with exercise at cardiac rehab. Vital signs remain stable. [name] has been increasing his met levels. [name] continues to do well with exercise at cardiac rehab. Vital signs remain stable. [name] has been  increasing his met levels. [name] will complete cardiac rehab at the end of the month.    Expected Outcomes [name] will continue to participate in cardiac rehab for exercise, nutrition and lifesytle modifications [name] will continue to participate in cardiac rehab for exercise, nutrition and lifesytle modifications [name] will continue to participate in cardiac rehab for exercise, nutrition and lifesytle modifications [name] will continue to participate in cardiac rehab for exercise, nutrition and lifesytle modifications             Exercise Goals and Review:  Exercise Goals     Row Name 07/30/23 1126             Exercise Goals   Increase Physical Activity Yes       Intervention Provide advice, education, support and counseling about physical activity/exercise needs.;Develop an individualized exercise prescription for aerobic and resistive training based on initial evaluation findings, risk stratification, comorbidities and participant's personal goals.       Expected Outcomes Short Term: Attend rehab on a regular basis to increase amount of physical activity.;Long Term: Exercising regularly at least 3-5 days a week.;Long Term: Add in home exercise to make exercise part of routine and to increase amount of physical activity.       Increase Strength and Stamina Yes       Intervention Provide advice, education, support and counseling about physical activity/exercise needs.;Develop an individualized exercise prescription for aerobic and resistive training based on initial evaluation findings, risk stratification, comorbidities and participant's personal goals.       Expected Outcomes Short Term: Increase workloads  from initial exercise prescription for resistance, speed, and METs.;Long Term: Improve cardiorespiratory fitness, muscular endurance and strength as measured by increased METs and functional capacity ( );Short Term: Perform resistance training exercises routinely during rehab and add in  resistance training at home       Able to understand and use rate of perceived exertion (RPE) scale Yes       Intervention Provide education and explanation on how to use RPE scale       Expected Outcomes Long Term:  Able to use RPE to guide intensity level when exercising independently;Short Term: Able to use RPE daily in rehab to express subjective intensity level       Knowledge and understanding of Target Heart Rate Range (THRR) Yes       Intervention Provide education and explanation of THRR including how the numbers were predicted and where they are located for reference       Expected Outcomes Short Term: Able to state/look up THRR;Short Term: Able to use daily as guideline for intensity in rehab;Long Term: Able to use THRR to govern intensity when exercising independently       Understanding of Exercise Prescription Yes       Intervention Provide education, explanation, and written materials on patient's individual exercise prescription       Expected Outcomes Short Term: Able to explain program exercise prescription;Long Term: Able to explain home exercise prescription to exercise independently                Exercise Goals Re-Evaluation:  Exercise Goals Re-Evaluation     Row Name 08/06/23 1127 08/17/23 1026 09/17/23 1130 09/19/23 1110 10/15/23 1044     Exercise Goal Re-Evaluation   Exercise Goals Review Increase Physical Activity;Increase Strength and Stamina;Able to understand and use rate of perceived exertion (RPE) scale Increase Physical Activity;Increase Strength and Stamina;Able to understand and use rate of perceived exertion (RPE) scale;Knowledge and understanding of Target Heart Rate Range (THRR);Able to check pulse independently;Understanding of Exercise Prescription Increase Physical Activity;Increase Strength and Stamina;Able to understand and use rate of perceived exertion (RPE) scale;Knowledge and understanding of Target Heart Rate Range (THRR);Able to check pulse  independently;Understanding of Exercise Prescription Increase Physical Activity;Increase Strength and Stamina;Able to understand and use rate of perceived exertion (RPE) scale;Knowledge and understanding of Target Heart Rate Range (THRR);Able to check pulse independently;Understanding of Exercise Prescription Increase Physical Activity;Increase Strength and Stamina;Able to understand and use rate of perceived exertion (RPE) scale;Knowledge and understanding of Target Heart Rate Range (THRR);Able to check pulse independently;Understanding of Exercise Prescription   Comments [name] was able to understand and use RPE scale appropriately. Reviewed exercise prescription with [name]. He is walking on the days he doesn't attend cardiac rehab and using 12 lb hand weights. Increased hand weights at cardiac rehab to 8 lbs and encouraged him to do weights on non-consecutive days. He is able to count his pulse. [name] continues to make great progress with exericse. Increasing workloads appropriately. [name] is walking 1.5-2 miles and using light weights at home. He was able to resume playing pickleball. He increased his speed an incline on the treadmill today and his level on the recumbent elliptical machine. [name] continues to progress well with exercise at cardiac rehab. He is walking at home consistently.   Expected Outcomes Progress workloads as tolerated to help increase strength and stamina. [name] will continue daily exercise routine to help improve cardiorespiratory fitness. Continue to progress workloads as tolerated. Continue walking and recreational activites in addition to exercise  at cardiac rehab. Continue current exercise routine.    Row Name 10/17/23 1055 10/19/23 1134           Exercise Goal Re-Evaluation   Exercise Goals Review Increase Physical Activity;Increase Strength and Stamina;Able to understand and use rate of perceived exertion (RPE) scale;Knowledge and understanding of Target Heart Rate Range (THRR);Able to  check pulse independently;Understanding of Exercise Prescription Increase Physical Activity;Increase Strength and Stamina;Able to understand and use rate of perceived exertion (RPE) scale;Knowledge and understanding of Target Heart Rate Range (THRR);Able to check pulse independently;Understanding of Exercise Prescription      Comments [name] will complete cardiac rehab this Friday. His functional increased 16% as measured by . He plans to continue walking and exercise at the gym. He will do aerobic exercise 3-4 days/ week and weight training 2-3 non-consecutive days/week. He will stretch before and after exercise. He is walking 1.5 miles. ken completed the cardiac rehab program today and progressed well achieving 6.1 METs with exercise. He is very appreciative of his time in cardiac rehab and will continue his home exercise routine at this time.      Expected Outcomes [name] will continue exericse at home and at the gym to maintain health and fitness gains. [name] will continue exericse at home and at the gym to maintain health and fitness gains.               Nutrition & Weight - Outcomes:  Pre Biometrics - 07/30/23 1123       Pre Biometrics   Waist Circumference 40 inches    Hip Circumference 39 inches    Waist to Hip Ratio 1.03 %    Triceps Skinfold 12 mm    % Body Fat 26.4 %    Grip Strength 40 kg    Flexibility 0 in   could not reach   Single Leg Stand 23 seconds             Post Biometrics - 10/19/23 1045        Post  Biometrics   Height 5\' 8"  (1.727 m)    Waist Circumference 39.5 inches    Hip Circumference 38.25 inches    Waist to Hip Ratio 1.03 %    BMI (Calculated) 26.82    Triceps Skinfold 12 mm    % Body Fat 26.1 %    Grip Strength 40 kg    Flexibility 0 in   could not reach   Single Leg Stand 30 seconds             Nutrition:  Nutrition Therapy & Goals - 10/19/23 1142       Nutrition Therapy   Diet Heart Healthy Diet    Drug/Food Interactions  Statins/Certain Fruits      Personal Nutrition Goals   Nutrition Goal Patient to identify strategies for reducing cardiovascular risk by attending the [facility] education and nutrition series weekly.   goal met.   Personal Goal #2 Patient to improve diet quality by using the plate method as a guide for meal planning to include lean protein/plant protein, fruits, vegetables, whole grains, nonfat dairy as part of a well-balanced diet.   goal in progress.   Comments Goals in progress. [name] has medical history of STEMI, hyperlipidemia, diverticulosis. LDL has improved but not at goal of <55; lipid goals reviewed at PCP follow-up on 09/19/23 and recommended follow-up with lipid clinic to discuss zetia, [id number]-i. [name] reports that he enjoys cooking.  He has attended the [facility] education/nutrition series ~  1/3x per week. He has maintained his weight since starting with our program.  Patient will benefit from adherence to nutrition, exercise, and lifestyle modification.      Intervention Plan   Intervention Prescribe, educate and counsel regarding individualized specific dietary modifications aiming towards targeted core components such as weight, hypertension, lipid management, diabetes, heart failure and other comorbidities.;Nutrition handout(s) given to patient.    Expected Outcomes Short Term Goal: Understand basic principles of dietary content, such as calories, fat, sodium, cholesterol and nutrients.;Long Term Goal: Adherence to prescribed nutrition plan.             Nutrition Discharge:  Nutrition Assessments - 10/23/23 1046       Rate Your Plate Scores   Post Score 57             Education Questionnaire Score:  Knowledge Questionnaire Score - 10/19/23 1134       Knowledge Questionnaire Score   Pre Score 23/24    Post Score 23/24             Goals reviewed with patient; copy given to patient.Pt graduates from  Intensive/Traditional cardiac rehab program on 10/19/23 with  completion of  26 exercise and  8 education sessions. Pt maintained good attendance and progressed nicely during his participation in rehab as evidenced by increased MET level. [name] increased his distance on his post exercise walk test by 300 feet.   Medication list reconciled. Repeat  PHQ score- 1 .  Pt has made significant lifestyle changes and should be commended for his success. [name] achieved his goals during cardiac rehab.   Pt plans to continue exercise at the gym 3 days a week and walking 3 days a week. We are proud of Ken's progress!Monte Antonio RN BSN

## 2023-10-24 ENCOUNTER — Encounter (HOSPITAL_COMMUNITY): Admission: RE | Admit: 2023-10-24 | Payer: Medicare Other | Source: Ambulatory Visit

## 2023-11-12 ENCOUNTER — Other Ambulatory Visit: Payer: Self-pay | Admitting: Internal Medicine

## 2023-11-13 ENCOUNTER — Other Ambulatory Visit: Payer: Self-pay | Admitting: Cardiology

## 2023-11-26 ENCOUNTER — Encounter: Payer: Self-pay | Admitting: Pharmacist Clinician (PhC)/ Clinical Pharmacy Specialist

## 2023-11-26 ENCOUNTER — Ambulatory Visit: Attending: Cardiology | Admitting: Pharmacist Clinician (PhC)/ Clinical Pharmacy Specialist

## 2023-11-26 DIAGNOSIS — E785 Hyperlipidemia, unspecified: Secondary | ICD-10-CM | POA: Diagnosis present

## 2023-11-26 MED ORDER — EZETIMIBE 10 MG PO TABS: 10.0000 mg | ORAL_TABLET | Freq: Every day | ORAL | 3 refills | Status: DC

## 2023-11-26 NOTE — Progress Notes (Signed)
 Office Visit    Patient Name: Terry Kerr Date of Encounter: 11/26/2023  Primary Care Provider:  Loreli Elsie JONETTA Mickey., MD Primary Cardiologist:  Lynwood Schilling, MD  Chief Complaint    Hyperlipidemia - familial  Significant Past Medical History   CAD 2/25 STEMI 90% mLAD - DES, 80% PL branch of RCA - DES, on DAPT (effient , aspirin )  HTN Controlled on losartan , spironolactone , carvedilol      Allergies  Allergen Reactions   Dust Mite Extract Other (See Comments)   Grass Pollen([id number]-O-[id number]-T-Swt Vern) Other (See Comments)   Molds & Smuts Other (See Comments)    History of Present Illness    Terry Kerr is a 68 y.o. male patient of Dr Schilling, in the office today to discuss options for cholesterol management.  Insurance Carrier: [employer] FEP  Pharmacy:  [facility]    LDL Cholesterol goal:  LDL < 70  Current Medications:  atorvastatin  80 mg qd   Family Hx:   both grandfathers, father and paternal uncle all had MI's in their 59's; one sister - unknown helath; one daughter, healthy  Social Hx: no tobacco   Diet:  rarely eats out, does home cooking with lean meats and variety of proteins, vegetables all fresh; snacking is fruits, healthy options  Exercise: gym regularly   Accessory Clinical Findings   08/2023:  (in [facility])  TC 133, TG 91, HLD 39, LDL 79  Lab Results  Component Value Date   CHOL 289 (H) 07/16/2023   HDL 49 07/16/2023   LDLCALC 206 (H) 07/16/2023   LDLDIRECT 118.8 04/26/2010   TRIG 172 (H) 07/16/2023   CHOLHDL 5.9 07/16/2023    Lipoprotein (a)  Date/Time Value Ref Range Status  07/17/2023 02:33 AM 64.6 (H) <75.0 nmol/L Final    Comment:    (NOTE) Note:  Values greater than or equal to 75.0 nmol/L may       indicate an independent risk factor for CHD,       but must be evaluated with caution when applied       to non-Caucasian populations due to the       influence of genetic factors on Lp(a) across       ethnicities. Performed At: [facility] [address] [place], [place] [id number] Jennette Shorter MD Ey:[phone]     Lab Results  Component Value Date   ALT 16 07/16/2023   AST 21 07/16/2023   ALKPHOS 50 07/16/2023   BILITOT 0.8 07/16/2023   Lab Results  Component Value Date   CREATININE 1.05 07/24/2023   BUN 23 07/24/2023   NA 143 07/24/2023   K 4.7 07/24/2023   CL 104 07/24/2023   CO2 25 07/24/2023   Lab Results  Component Value Date   HGBA1C 5.0 07/16/2023    Home Medications    Current Outpatient Medications  Medication Sig Dispense Refill   ezetimibe (ZETIA) 10 MG tablet Take 1 tablet (10 mg total) by mouth daily. 30 tablet 3   aspirin  EC 81 MG tablet Take 1 tablet (81 mg total) by mouth daily. Swallow whole. 90 tablet 2   atorvastatin  (LIPITOR) 80 MG tablet Take 1 tablet (80 mg total) by mouth daily. 90 tablet 1   carvedilol  (COREG ) 6.25 MG tablet TAKE 1 TABLET(6.25 MG) BY MOUTH TWICE DAILY WITH A MEAL 60 tablet 6   Cholecalciferol (VITAMIN D ) 2000 UNITS tablet Take 2,000 Units by mouth daily. 5,000 units a day     fexofenadine (ALLEGRA) 30 MG  tablet Take 30 mg by mouth daily as needed (for allergies).     HUMIRA , 2 PEN, 40 MG/0.4ML pen INJECT 1 PEN UNDER THE SKIN EVERY 14 DAYS. 2 each 3   hyoscyamine  (LEVSIN  SL) 0.125 MG SL tablet Take 1-2 tablet every 4-6 hours as needed for abdominal pain/spasm (Patient not taking: Reported on 09/10/2023) 120 tablet 1   losartan  (COZAAR ) 25 MG tablet Take 1 tablet (25 mg total) by mouth daily. 90 tablet 1   nitroGLYCERIN  (NITROSTAT ) 0.4 MG SL tablet Place 1 tablet (0.4 mg total) under the tongue every 5 (five) minutes as needed for chest pain (Do not give more than 3 SL tablets in 15 minutes.). 25 tablet 2   prasugrel  (EFFIENT ) 10 MG TABS tablet TAKE 1 TABLET(10 MG) BY MOUTH DAILY 90 tablet 3   spironolactone  (ALDACTONE ) 25 MG tablet Take 0.5 tablets (12.5 mg total) by mouth daily. 30 tablet 1   No current facility-administered medications for this visit.      Assessment & Plan    Dyslipidemia Assessment: Patient with ASCVD and familial hyperlipidemia not at LDL goal of < 70 Most recent LDL 79 on 09/12/23 Has been compliant with high intensity statin : atorvastatin  80 Reviewed options for lowering LDL cholesterol, including ezetimibe, PCSK-9 inhibitors, bempedoic acid and inclisiran.  Discussed mechanisms of action, dosing, side effects, potential decreases in LDL cholesterol and costs.  Also reviewed potential options for patient assistance.  Plan: Patient agreeable to starting ezetimibe 10 mg daily Will consider Repatha if has GI issues with ezetimibe (pt has Crohn's, well controlled) Repeat labs after:  3 months Lipid Liver function   Allean Mink, PharmD CPP [facility] [address]   [place], [place] [address] [phone]  11/26/2023, 9:47 AM

## 2023-11-26 NOTE — Patient Instructions (Signed)
 Your Results:             Your most recent labs Goal  Total Cholesterol 133 < 200  Triglycerides 91 < 150  HDL (happy/good cholesterol) 39 > 40  LDL (lousy/bad cholesterol 79 < 70   Medication changes:  Start ezetimibe 10 mg once daily  Continue with atorvastatin  80 mg daily  Lab orders:  We want to repeat labs after 2-3 months.  We will send you a lab order to remind you once we get closer to that time.     Thank you for choosing [facility]

## 2023-11-26 NOTE — Assessment & Plan Note (Signed)
 Assessment: Patient with ASCVD and familial hyperlipidemia not at LDL goal of < 70 Most recent LDL 79 on 09/12/23 Has been compliant with high intensity statin : atorvastatin  80 Reviewed options for lowering LDL cholesterol, including ezetimibe, PCSK-9 inhibitors, bempedoic acid and inclisiran.  Discussed mechanisms of action, dosing, side effects, potential decreases in LDL cholesterol and costs.  Also reviewed potential options for patient assistance.  Plan: Patient agreeable to starting ezetimibe 10 mg daily Will consider Repatha if has GI issues with ezetimibe (pt has Crohn's, well controlled) Repeat labs after:  3 months Lipid Liver function

## 2023-12-21 ENCOUNTER — Other Ambulatory Visit: Payer: Self-pay | Admitting: Cardiology

## 2024-01-23 ENCOUNTER — Encounter: Payer: Self-pay | Admitting: Pharmacist Clinician (PhC)/ Clinical Pharmacy Specialist

## 2024-01-30 ENCOUNTER — Other Ambulatory Visit: Payer: Self-pay | Admitting: Cardiology

## 2024-02-02 ENCOUNTER — Other Ambulatory Visit: Payer: Self-pay | Admitting: Cardiology

## 2024-02-17 DIAGNOSIS — I10 Essential (primary) hypertension: Secondary | ICD-10-CM | POA: Insufficient documentation

## 2024-02-17 DIAGNOSIS — I2510 Atherosclerotic heart disease of native coronary artery without angina pectoris: Secondary | ICD-10-CM | POA: Insufficient documentation

## 2024-02-17 NOTE — Progress Notes (Unsigned)
 Cardiology Office Note:   Date:  02/19/2024  ID:  STEVEN BASSO, DOB 02-11-1956, MRN [id number] PCP: Loreli Elsie JONETTA Mickey., MD  [facility] Providers Cardiologist:  Lynwood Schilling, MD {  History of Present Illness:   Terry Kerr is a 68 y.o. male with visit-pertinent history of CAD s/p STEMI on 07/16/2023, hyperlipidemia, Crohn's disease s/p colon resection in 2016.   In 2020 he was seen for evaluation of shoulder pain/dyspnea, referred to coronary calcium  score of 23, 39th percentile.  On 07/16/2023 he presented to the emergency room for chest pain.  He had been in his usual state of health until the day of admission.  He was playing pickle ball and developed centralized chest pain with radiation into the left arm with associated diaphoresis.  He was able to finish the game but continued to have pain.  He was brought to the emergency room by his neighbor.  EKG showed sinus rhythm, 71 bpm with inferolateral ST elevation.  Code STEMI was called.  Cardiac catheterization indicated two-vessel CAD, 90% mid LAD treated PCI/DES x 1, 80% large PL branch of RCA treated with PCI/DES x 1.  Mildly elevated LVEDP of 19 mmHg.  Was recommended that patient have DAPT with aspirin  and Effient  for at least 1 year.  Echocardiogram on 07/17/2023 indicated LVEF of 50 to 55%, LV with low normal function, LV demonstrates some regional wall motion abnormalities, mild concentric LVH, diastolic parameters were normal, apical hypokinesis without apical thrombus.  Right ventricular systolic function was normal.  There were no significant valvular abnormalities.  Patient was discharged on 07/17/2023 in stable condition on aspirin , Effient , atorvastatin , carvedilol , losartan  and spironolactone .  He presents for follow up.  Since he was seen postprocedure he has done well. The patient denies any new symptoms such as chest discomfort, neck or arm discomfort. There has been no new shortness of breath, PND or orthopnea. There  have been no reported palpitations, presyncope or syncope.    ROS: As stated in the HPI and negative for all other systems.  Studies Reviewed:    EKG:     NA  Risk Assessment/Calculations:              Physical Exam:   VS:  BP 120/70   Pulse (!) 57   Ht 5' 8 (1.727 m)   Wt 184 lb 14.4 oz (83.9 kg)   SpO2 97%   BMI 28.11 kg/m    Wt Readings from Last 3 Encounters:  02/19/24 184 lb 14.4 oz (83.9 kg)  10/19/23 176 lb 5.9 oz (80 kg)  09/10/23 176 lb 9.6 oz (80.1 kg)     GEN: Well nourished, well developed in no acute distress NECK: No JVD; left carotid carotid bruits CARDIAC: RRR, no murmurs, rubs, gallops RESPIRATORY:  Clear to auscultation without rales, wheezing or rhonchi  ABDOMEN: Soft, non-tender, non-distended EXTREMITIES:  No edema; No deformity   ASSESSMENT AND PLAN:   CAD: The patient has no new sypmtoms.  No further cardiovascular testing is indicated.  We will continue with aggressive risk reduction and meds as listed.  I do plan to continue DAPT until February and then switch him to Effient  alone.     Hyperlipidemia: LDL was most recently 76.  Zetia  was added and he is scheduled to come back for lipid profile with a goal in the 50s.    Hypertension: Blood pressure is at target.  No change in therapy.  Bruit: He will get carotid Dopplers to  evaluate a bruit     Follow up with me in Feb.   Signed, Lynwood Schilling, MD

## 2024-02-19 ENCOUNTER — Encounter: Payer: Self-pay | Admitting: Cardiology

## 2024-02-19 ENCOUNTER — Ambulatory Visit: Attending: Cardiology | Admitting: Cardiology

## 2024-02-19 VITALS — BP 120/70 | HR 57 | Ht 68.0 in | Wt 184.9 lb

## 2024-02-19 DIAGNOSIS — R0989 Other specified symptoms and signs involving the circulatory and respiratory systems: Secondary | ICD-10-CM | POA: Insufficient documentation

## 2024-02-19 DIAGNOSIS — I2510 Atherosclerotic heart disease of native coronary artery without angina pectoris: Secondary | ICD-10-CM | POA: Diagnosis not present

## 2024-02-19 DIAGNOSIS — E785 Hyperlipidemia, unspecified: Secondary | ICD-10-CM | POA: Insufficient documentation

## 2024-02-19 DIAGNOSIS — I10 Essential (primary) hypertension: Secondary | ICD-10-CM | POA: Diagnosis present

## 2024-02-19 DIAGNOSIS — I4729 Other ventricular tachycardia: Secondary | ICD-10-CM | POA: Diagnosis present

## 2024-02-19 NOTE — Patient Instructions (Signed)
 Medication Instructions:  Your physician recommends that you continue on your current medications as directed. Please refer to the Current Medication list given to you today.  *If you need a refill on your cardiac medications before your next appointment, please call your pharmacy*  Lab Work: NONE If you have labs (blood work) drawn today and your tests are completely normal, you will receive your results only by: MyChart Message (if you have MyChart) OR A paper copy in the mail If you have any lab test that is abnormal or we need to change your treatment, we will call you to review the results.  Testing/Procedures: Carotid Doppler  Follow-Up: At [facility], you and your health needs are our priority.  As part of our continuing mission to provide you with exceptional heart care, our providers are all part of one team.  This team includes your primary Cardiologist (physician) and Advanced Practice Providers or APPs (Physician Assistants and Nurse Practitioners) who all work together to provide you with the care you need, when you need it.  Your next appointment:   February 2026  Provider:   [place], MD  We recommend signing up for the patient portal called MyChart.  Sign up information is provided on this After Visit Summary.  MyChart is used to connect with patients for Virtual Visits (Telemedicine).  Patients are able to view lab/test results, encounter notes, upcoming appointments, etc.  Non-urgent messages can be sent to your provider as well.   To learn more about what you can do with MyChart, go to [email or web address].

## 2024-02-20 DIAGNOSIS — R0989 Other specified symptoms and signs involving the circulatory and respiratory systems: Secondary | ICD-10-CM | POA: Insufficient documentation

## 2024-02-25 ENCOUNTER — Other Ambulatory Visit: Payer: Self-pay

## 2024-02-25 MED ORDER — PRASUGREL HCL 10 MG PO TABS: 10.0000 mg | ORAL_TABLET | Freq: Every day | ORAL | 3 refills | Status: DC

## 2024-03-04 ENCOUNTER — Encounter (HOSPITAL_COMMUNITY)

## 2024-03-13 ENCOUNTER — Other Ambulatory Visit: Payer: Self-pay | Admitting: Cardiology

## 2024-03-18 ENCOUNTER — Other Ambulatory Visit: Payer: Self-pay | Admitting: Internal Medicine

## 2024-04-04 ENCOUNTER — Ambulatory Visit (HOSPITAL_COMMUNITY)

## 2024-04-08 ENCOUNTER — Other Ambulatory Visit: Payer: Self-pay

## 2024-04-08 ENCOUNTER — Telehealth: Payer: Self-pay | Admitting: Internal Medicine

## 2024-04-08 NOTE — Telephone Encounter (Signed)
 Spoke with pt and he may need to switch to a biosimilar due to insurance/medicare. He is going to bring a form for Dr. Albertus to try and ask for a tier exception with insurance co. He also states that he had to have 2 stents placed this year and he will not be able to come off of blood thinner until after the first of the year if he needs to have a colon done. Dr. Albertus notified.

## 2024-04-08 NOTE — Telephone Encounter (Signed)
 Pt states he is bringing form this week for Dr. Albertus to fill out.

## 2024-04-08 NOTE — Telephone Encounter (Signed)
 Ok for biosimilar but I will complete tier exception paperwork 1st to see if insurance will approve We should wait until cardiology allows anti-platelet interruption before endoscopic procedures.

## 2024-04-08 NOTE — Telephone Encounter (Signed)
 Inbound call from patient requesting a call back to discuss change in medication. Please advise.

## 2024-04-15 ENCOUNTER — Other Ambulatory Visit: Payer: Self-pay | Admitting: Cardiology

## 2024-04-26 ENCOUNTER — Encounter: Payer: Self-pay | Admitting: Internal Medicine

## 2024-04-30 ENCOUNTER — Other Ambulatory Visit: Payer: Self-pay | Admitting: Internal Medicine

## 2024-05-01 ENCOUNTER — Other Ambulatory Visit (HOSPITAL_COMMUNITY): Payer: Self-pay

## 2024-05-09 ENCOUNTER — Other Ambulatory Visit: Payer: Self-pay | Admitting: Cardiology

## 2024-05-12 ENCOUNTER — Telehealth: Payer: Self-pay

## 2024-05-12 NOTE — Telephone Encounter (Addendum)
 Pharmacy called requesting confirmation for medication Adalimumab -adaz 40 MG/0.4ML. [phone] Please Advise Thank you.

## 2024-05-14 ENCOUNTER — Other Ambulatory Visit: Payer: Self-pay | Admitting: Internal Medicine

## 2024-05-14 NOTE — Telephone Encounter (Signed)
 Spoke with pharmacy, verbal order given over the phone.

## 2024-06-24 ENCOUNTER — Other Ambulatory Visit: Payer: Self-pay

## 2024-06-26 ENCOUNTER — Other Ambulatory Visit: Payer: Self-pay | Admitting: Cardiology

## 2024-06-30 MED ORDER — ATORVASTATIN CALCIUM 80 MG PO TABS: 80.0000 mg | ORAL_TABLET | Freq: Every day | ORAL | 1 refills | Status: AC

## 2024-06-30 MED ORDER — EZETIMIBE 10 MG PO TABS: 10.0000 mg | ORAL_TABLET | Freq: Every day | ORAL | 1 refills | Status: AC

## 2024-06-30 MED ORDER — PRASUGREL HCL 10 MG PO TABS: 10.0000 mg | ORAL_TABLET | Freq: Every day | ORAL | 1 refills | Status: AC

## 2024-07-01 ENCOUNTER — Telehealth: Payer: Self-pay | Admitting: Internal Medicine

## 2024-07-01 ENCOUNTER — Other Ambulatory Visit: Payer: Self-pay

## 2024-07-01 MED ORDER — HUMIRA (2 PEN) 40 MG/0.4ML ~~LOC~~ AJKT: 40.0000 mg | AUTO-INJECTOR | SUBCUTANEOUS | 11 refills | Status: AC

## 2024-07-01 NOTE — Telephone Encounter (Signed)
 Pt states his insurance will cover name brand Humira  now and he needs script sent in for this instead of Hyrimoz  that he got last month. New script sent to pharmacy pt, may need PA.

## 2024-07-02 LAB — HEPATIC FUNCTION PANEL
ALT: 26 [IU]/L (ref 0–44)
AST: 19 [IU]/L (ref 0–40)
Albumin: 4 g/dL (ref 3.9–4.9)
Alkaline Phosphatase: 59 [IU]/L (ref 47–123)
Bilirubin Total: 0.8 mg/dL (ref 0.0–1.2)
Bilirubin, Direct: 0.25 mg/dL (ref 0.00–0.40)
Total Protein: 6.5 g/dL (ref 6.0–8.5)

## 2024-07-02 LAB — LIPID PANEL
Chol/HDL Ratio: 2.9 ratio (ref 0.0–5.0)
Cholesterol, Total: 120 mg/dL (ref 100–199)
HDL: 42 mg/dL
LDL Chol Calc (NIH): 47 mg/dL (ref 0–99)
Triglycerides: 187 mg/dL — ABNORMAL HIGH (ref 0–149)
VLDL Cholesterol Cal: 31 mg/dL (ref 5–40)

## 2024-07-03 ENCOUNTER — Other Ambulatory Visit (HOSPITAL_COMMUNITY): Payer: Self-pay

## 2024-07-03 ENCOUNTER — Telehealth: Payer: Self-pay

## 2024-07-03 NOTE — Telephone Encounter (Signed)
 Pharmacy Patient Advocate Encounter   Received notification from Pt Calls Messages that prior authorization for Humira  (2 Pen) (CF) 40MG /0.4ML auto-injector kit is required/requested.   Insurance verification completed.   The patient is insured through [employer].   Per test claim: PA required; PA submitted to above mentioned insurance via Latent Key/confirmation #/EOC [id number] Status is pending

## 2024-07-03 NOTE — Telephone Encounter (Signed)
 PA request has been Submitted. New Encounter has been or will be created for follow up. For additional info see Pharmacy Prior Auth telephone encounter from 07/03/2024.

## 2024-07-04 ENCOUNTER — Ambulatory Visit: Payer: Self-pay | Admitting: Pharmacist Clinician (PhC)/ Clinical Pharmacy Specialist

## 2024-07-04 ENCOUNTER — Other Ambulatory Visit (HOSPITAL_COMMUNITY): Payer: Self-pay

## 2024-07-04 NOTE — Telephone Encounter (Signed)
 Pharmacy Patient Advocate Encounter  Received notification from [employer] [facility] [employer] that Prior Authorization for Humira  (2 Pen) (CF) 40MG /0.4ML auto-injector kit  has been APPROVED from 05-29-2024 to 07-03-2025   PA #/Case ID/Reference #: [id number]
